# Patient Record
Sex: Female | Born: 1975 | Race: Black or African American | Hispanic: No | Marital: Single | Smoking: Never smoker
Health system: Southern US, Community
[De-identification: ages and names within clinical notes are randomized; demographics above are authoritative.]

## PROBLEM LIST (undated history)

## (undated) DIAGNOSIS — N3010 Interstitial cystitis (chronic) without hematuria: Secondary | ICD-10-CM

## (undated) DIAGNOSIS — F319 Bipolar disorder, unspecified: Secondary | ICD-10-CM

## (undated) DIAGNOSIS — M1990 Unspecified osteoarthritis, unspecified site: Secondary | ICD-10-CM

## (undated) DIAGNOSIS — Z90710 Acquired absence of both cervix and uterus: Secondary | ICD-10-CM

## (undated) DIAGNOSIS — G43909 Migraine, unspecified, not intractable, without status migrainosus: Secondary | ICD-10-CM

## (undated) DIAGNOSIS — F329 Major depressive disorder, single episode, unspecified: Secondary | ICD-10-CM

## (undated) DIAGNOSIS — F32A Depression, unspecified: Secondary | ICD-10-CM

## (undated) DIAGNOSIS — G4700 Insomnia, unspecified: Secondary | ICD-10-CM

## (undated) DIAGNOSIS — F419 Anxiety disorder, unspecified: Secondary | ICD-10-CM

## (undated) DIAGNOSIS — I10 Essential (primary) hypertension: Secondary | ICD-10-CM

## (undated) DIAGNOSIS — M549 Dorsalgia, unspecified: Secondary | ICD-10-CM

## (undated) DIAGNOSIS — M797 Fibromyalgia: Secondary | ICD-10-CM

## (undated) DIAGNOSIS — I456 Pre-excitation syndrome: Secondary | ICD-10-CM

## (undated) DIAGNOSIS — G930 Cerebral cysts: Secondary | ICD-10-CM

## (undated) DIAGNOSIS — N83209 Unspecified ovarian cyst, unspecified side: Secondary | ICD-10-CM

## (undated) DIAGNOSIS — G8929 Other chronic pain: Secondary | ICD-10-CM

## (undated) DIAGNOSIS — F4310 Post-traumatic stress disorder, unspecified: Secondary | ICD-10-CM

## (undated) DIAGNOSIS — K219 Gastro-esophageal reflux disease without esophagitis: Secondary | ICD-10-CM

## (undated) DIAGNOSIS — D219 Benign neoplasm of connective and other soft tissue, unspecified: Secondary | ICD-10-CM

## (undated) HISTORY — PX: ABDOMINAL HYSTERECTOMY: SHX81

## (undated) HISTORY — PX: CARDIAC CATHETERIZATION: SHX172

---

## 1998-05-10 ENCOUNTER — Ambulatory Visit (HOSPITAL_COMMUNITY): Admission: RE | Admit: 1998-05-10 | Discharge: 1998-05-10 | Payer: Self-pay | Admitting: Cardiology

## 1998-06-25 ENCOUNTER — Emergency Department (HOSPITAL_COMMUNITY): Admission: EM | Admit: 1998-06-25 | Discharge: 1998-06-25 | Payer: Self-pay | Admitting: Emergency Medicine

## 1998-07-31 ENCOUNTER — Encounter: Payer: Self-pay | Admitting: Cardiology

## 1998-07-31 ENCOUNTER — Ambulatory Visit (HOSPITAL_COMMUNITY): Admission: RE | Admit: 1998-07-31 | Discharge: 1998-07-31 | Payer: Self-pay | Admitting: Cardiology

## 1998-11-21 ENCOUNTER — Ambulatory Visit (HOSPITAL_COMMUNITY): Admission: RE | Admit: 1998-11-21 | Discharge: 1998-11-21 | Payer: Self-pay | Admitting: Gynecology

## 1999-03-10 ENCOUNTER — Emergency Department (HOSPITAL_COMMUNITY): Admission: EM | Admit: 1999-03-10 | Discharge: 1999-03-10 | Payer: Self-pay | Admitting: Internal Medicine

## 1999-06-03 ENCOUNTER — Other Ambulatory Visit: Admission: RE | Admit: 1999-06-03 | Discharge: 1999-06-03 | Payer: Self-pay | Admitting: Obstetrics

## 1999-09-06 ENCOUNTER — Inpatient Hospital Stay (HOSPITAL_COMMUNITY): Admission: AD | Admit: 1999-09-06 | Discharge: 1999-09-06 | Payer: Self-pay | Admitting: Obstetrics

## 1999-12-24 ENCOUNTER — Inpatient Hospital Stay (HOSPITAL_COMMUNITY): Admission: AD | Admit: 1999-12-24 | Discharge: 1999-12-24 | Payer: Self-pay | Admitting: Obstetrics

## 1999-12-29 ENCOUNTER — Inpatient Hospital Stay (HOSPITAL_COMMUNITY): Admission: AD | Admit: 1999-12-29 | Discharge: 1999-12-29 | Payer: Self-pay | Admitting: Obstetrics

## 2000-01-03 ENCOUNTER — Inpatient Hospital Stay (HOSPITAL_COMMUNITY): Admission: AD | Admit: 2000-01-03 | Discharge: 2000-01-05 | Payer: Self-pay | Admitting: Obstetrics

## 2000-09-28 ENCOUNTER — Inpatient Hospital Stay (HOSPITAL_COMMUNITY): Admission: AD | Admit: 2000-09-28 | Discharge: 2000-09-28 | Payer: Self-pay | Admitting: *Deleted

## 2000-09-28 ENCOUNTER — Encounter: Payer: Self-pay | Admitting: *Deleted

## 2000-11-18 ENCOUNTER — Ambulatory Visit (HOSPITAL_COMMUNITY): Admission: RE | Admit: 2000-11-18 | Discharge: 2000-11-18 | Payer: Self-pay | Admitting: *Deleted

## 2000-11-18 ENCOUNTER — Encounter: Admission: RE | Admit: 2000-11-18 | Discharge: 2000-11-18 | Payer: Self-pay | Admitting: Obstetrics

## 2000-12-02 ENCOUNTER — Encounter: Admission: RE | Admit: 2000-12-02 | Discharge: 2000-12-02 | Payer: Self-pay | Admitting: Obstetrics & Gynecology

## 2001-01-06 ENCOUNTER — Encounter: Admission: RE | Admit: 2001-01-06 | Discharge: 2001-01-06 | Payer: Self-pay | Admitting: Obstetrics

## 2001-01-08 ENCOUNTER — Inpatient Hospital Stay (HOSPITAL_COMMUNITY): Admission: AD | Admit: 2001-01-08 | Discharge: 2001-01-08 | Payer: Self-pay | Admitting: Obstetrics

## 2001-01-10 ENCOUNTER — Inpatient Hospital Stay (HOSPITAL_COMMUNITY): Admission: AD | Admit: 2001-01-10 | Discharge: 2001-01-10 | Payer: Self-pay | Admitting: Obstetrics

## 2001-01-20 ENCOUNTER — Encounter: Admission: RE | Admit: 2001-01-20 | Discharge: 2001-01-20 | Payer: Self-pay | Admitting: Obstetrics

## 2001-01-27 ENCOUNTER — Encounter: Admission: RE | Admit: 2001-01-27 | Discharge: 2001-01-27 | Payer: Self-pay | Admitting: Obstetrics

## 2001-02-10 ENCOUNTER — Encounter: Admission: RE | Admit: 2001-02-10 | Discharge: 2001-02-10 | Payer: Self-pay | Admitting: Obstetrics

## 2001-02-24 ENCOUNTER — Encounter: Admission: RE | Admit: 2001-02-24 | Discharge: 2001-02-24 | Payer: Self-pay | Admitting: Obstetrics

## 2001-03-03 ENCOUNTER — Encounter: Admission: RE | Admit: 2001-03-03 | Discharge: 2001-03-03 | Payer: Self-pay | Admitting: Obstetrics

## 2001-03-17 ENCOUNTER — Encounter: Admission: RE | Admit: 2001-03-17 | Discharge: 2001-03-17 | Payer: Self-pay | Admitting: Obstetrics

## 2001-03-27 ENCOUNTER — Inpatient Hospital Stay (HOSPITAL_COMMUNITY): Admission: AD | Admit: 2001-03-27 | Discharge: 2001-03-27 | Payer: Self-pay | Admitting: *Deleted

## 2001-03-27 ENCOUNTER — Inpatient Hospital Stay (HOSPITAL_COMMUNITY): Admission: AD | Admit: 2001-03-27 | Discharge: 2001-03-29 | Payer: Self-pay | Admitting: Obstetrics & Gynecology

## 2001-10-21 ENCOUNTER — Encounter: Payer: Self-pay | Admitting: Emergency Medicine

## 2001-10-21 ENCOUNTER — Emergency Department (HOSPITAL_COMMUNITY): Admission: EM | Admit: 2001-10-21 | Discharge: 2001-10-21 | Payer: Self-pay | Admitting: Emergency Medicine

## 2002-04-04 ENCOUNTER — Other Ambulatory Visit: Admission: RE | Admit: 2002-04-04 | Discharge: 2002-04-04 | Payer: Self-pay | Admitting: Gynecology

## 2002-07-21 ENCOUNTER — Emergency Department (HOSPITAL_COMMUNITY): Admission: EM | Admit: 2002-07-21 | Discharge: 2002-07-21 | Payer: Self-pay | Admitting: Emergency Medicine

## 2002-07-21 ENCOUNTER — Encounter: Payer: Self-pay | Admitting: Emergency Medicine

## 2002-10-19 ENCOUNTER — Emergency Department (HOSPITAL_COMMUNITY): Admission: EM | Admit: 2002-10-19 | Discharge: 2002-10-20 | Payer: Self-pay

## 2002-10-19 ENCOUNTER — Encounter: Payer: Self-pay | Admitting: Emergency Medicine

## 2002-11-07 ENCOUNTER — Inpatient Hospital Stay (HOSPITAL_COMMUNITY): Admission: AD | Admit: 2002-11-07 | Discharge: 2002-11-07 | Payer: Self-pay | Admitting: *Deleted

## 2003-10-24 ENCOUNTER — Inpatient Hospital Stay (HOSPITAL_COMMUNITY): Admission: AD | Admit: 2003-10-24 | Discharge: 2003-10-24 | Payer: Self-pay | Admitting: Obstetrics and Gynecology

## 2003-12-09 ENCOUNTER — Emergency Department (HOSPITAL_COMMUNITY): Admission: EM | Admit: 2003-12-09 | Discharge: 2003-12-09 | Payer: Self-pay | Admitting: Emergency Medicine

## 2004-02-12 ENCOUNTER — Encounter: Admission: RE | Admit: 2004-02-12 | Discharge: 2004-02-12 | Payer: Self-pay | Admitting: Obstetrics and Gynecology

## 2004-02-26 ENCOUNTER — Ambulatory Visit (HOSPITAL_COMMUNITY): Admission: RE | Admit: 2004-02-26 | Discharge: 2004-02-26 | Payer: Self-pay | Admitting: Obstetrics and Gynecology

## 2004-11-07 ENCOUNTER — Emergency Department (HOSPITAL_COMMUNITY): Admission: EM | Admit: 2004-11-07 | Discharge: 2004-11-07 | Payer: Self-pay | Admitting: Emergency Medicine

## 2005-05-03 ENCOUNTER — Inpatient Hospital Stay (HOSPITAL_COMMUNITY): Admission: AD | Admit: 2005-05-03 | Discharge: 2005-05-03 | Payer: Self-pay | Admitting: *Deleted

## 2005-05-29 ENCOUNTER — Emergency Department (HOSPITAL_COMMUNITY): Admission: EM | Admit: 2005-05-29 | Discharge: 2005-05-29 | Payer: Self-pay | Admitting: Family Medicine

## 2005-08-04 ENCOUNTER — Inpatient Hospital Stay (HOSPITAL_COMMUNITY): Admission: AD | Admit: 2005-08-04 | Discharge: 2005-08-04 | Payer: Self-pay | Admitting: *Deleted

## 2005-10-17 ENCOUNTER — Inpatient Hospital Stay (HOSPITAL_COMMUNITY): Admission: AD | Admit: 2005-10-17 | Discharge: 2005-10-17 | Payer: Self-pay | Admitting: Gynecology

## 2005-10-22 ENCOUNTER — Emergency Department (HOSPITAL_COMMUNITY): Admission: EM | Admit: 2005-10-22 | Discharge: 2005-10-22 | Payer: Self-pay | Admitting: Emergency Medicine

## 2006-03-24 ENCOUNTER — Encounter: Payer: Self-pay | Admitting: Cardiology

## 2006-06-16 ENCOUNTER — Emergency Department (HOSPITAL_COMMUNITY): Admission: EM | Admit: 2006-06-16 | Discharge: 2006-06-16 | Payer: Self-pay | Admitting: Emergency Medicine

## 2006-11-07 ENCOUNTER — Inpatient Hospital Stay (HOSPITAL_COMMUNITY): Admission: AD | Admit: 2006-11-07 | Discharge: 2006-11-07 | Payer: Self-pay | Admitting: Gynecology

## 2006-11-10 ENCOUNTER — Emergency Department (HOSPITAL_COMMUNITY): Admission: EM | Admit: 2006-11-10 | Discharge: 2006-11-10 | Payer: Self-pay | Admitting: Emergency Medicine

## 2007-02-23 ENCOUNTER — Emergency Department (HOSPITAL_COMMUNITY): Admission: EM | Admit: 2007-02-23 | Discharge: 2007-02-23 | Payer: Self-pay | Admitting: Emergency Medicine

## 2007-06-04 ENCOUNTER — Inpatient Hospital Stay (HOSPITAL_COMMUNITY): Admission: AD | Admit: 2007-06-04 | Discharge: 2007-06-04 | Payer: Self-pay | Admitting: Obstetrics and Gynecology

## 2007-06-30 ENCOUNTER — Emergency Department (HOSPITAL_COMMUNITY): Admission: EM | Admit: 2007-06-30 | Discharge: 2007-06-30 | Payer: Self-pay | Admitting: *Deleted

## 2007-07-26 ENCOUNTER — Emergency Department (HOSPITAL_COMMUNITY): Admission: EM | Admit: 2007-07-26 | Discharge: 2007-07-26 | Payer: Self-pay | Admitting: Emergency Medicine

## 2007-08-26 ENCOUNTER — Inpatient Hospital Stay (HOSPITAL_COMMUNITY): Admission: AD | Admit: 2007-08-26 | Discharge: 2007-08-26 | Payer: Self-pay | Admitting: Gynecology

## 2007-10-28 ENCOUNTER — Emergency Department (HOSPITAL_COMMUNITY): Admission: EM | Admit: 2007-10-28 | Discharge: 2007-10-28 | Payer: Self-pay | Admitting: Emergency Medicine

## 2008-11-13 ENCOUNTER — Emergency Department (HOSPITAL_COMMUNITY): Admission: EM | Admit: 2008-11-13 | Discharge: 2008-11-13 | Payer: Self-pay | Admitting: Emergency Medicine

## 2009-06-08 ENCOUNTER — Inpatient Hospital Stay (HOSPITAL_COMMUNITY): Admission: AD | Admit: 2009-06-08 | Discharge: 2009-06-09 | Payer: Self-pay | Admitting: Obstetrics and Gynecology

## 2009-10-11 ENCOUNTER — Encounter (INDEPENDENT_AMBULATORY_CARE_PROVIDER_SITE_OTHER): Payer: Self-pay | Admitting: Obstetrics and Gynecology

## 2009-10-11 ENCOUNTER — Ambulatory Visit (HOSPITAL_COMMUNITY): Admission: RE | Admit: 2009-10-11 | Discharge: 2009-10-12 | Payer: Self-pay | Admitting: Obstetrics and Gynecology

## 2010-03-15 ENCOUNTER — Emergency Department (HOSPITAL_COMMUNITY): Admission: EM | Admit: 2010-03-15 | Discharge: 2010-03-15 | Payer: Self-pay | Admitting: Emergency Medicine

## 2010-11-03 ENCOUNTER — Emergency Department (HOSPITAL_COMMUNITY)
Admission: EM | Admit: 2010-11-03 | Discharge: 2010-11-03 | Payer: Self-pay | Source: Home / Self Care | Admitting: Emergency Medicine

## 2010-11-18 ENCOUNTER — Encounter (HOSPITAL_COMMUNITY)
Admission: RE | Admit: 2010-11-18 | Discharge: 2010-12-23 | Payer: Self-pay | Source: Home / Self Care | Attending: Cardiology | Admitting: Cardiology

## 2010-11-27 ENCOUNTER — Emergency Department (HOSPITAL_COMMUNITY)
Admission: EM | Admit: 2010-11-27 | Discharge: 2010-11-27 | Payer: Self-pay | Source: Home / Self Care | Admitting: Family Medicine

## 2010-11-27 LAB — POCT RAPID STREP A (OFFICE): Streptococcus, Group A Screen (Direct): NEGATIVE

## 2010-12-17 ENCOUNTER — Encounter (INDEPENDENT_AMBULATORY_CARE_PROVIDER_SITE_OTHER): Payer: Self-pay | Admitting: *Deleted

## 2010-12-17 LAB — CONVERTED CEMR LAB
ALT: 14 units/L (ref 0–35)
AST: 21 units/L (ref 0–37)
Albumin: 4.4 g/dL (ref 3.5–5.2)
Alkaline Phosphatase: 64 units/L (ref 39–117)
BUN: 12 mg/dL (ref 6–23)
Basophils Absolute: 0 10*3/uL (ref 0.0–0.1)
Basophils Relative: 0 % (ref 0–1)
CO2: 24 meq/L (ref 19–32)
Calcium: 9 mg/dL (ref 8.4–10.5)
Chloride: 104 meq/L (ref 96–112)
Creatinine, Ser: 0.86 mg/dL (ref 0.40–1.20)
Eosinophils Absolute: 0.1 10*3/uL (ref 0.0–0.7)
Eosinophils Relative: 3 % (ref 0–5)
Glucose, Bld: 88 mg/dL (ref 70–99)
HCT: 37.5 % (ref 36.0–46.0)
Hemoglobin: 12.2 g/dL (ref 12.0–15.0)
Lymphocytes Relative: 43 % (ref 12–46)
Lymphs Abs: 2.3 10*3/uL (ref 0.7–4.0)
MCHC: 32.5 g/dL (ref 30.0–36.0)
MCV: 86.2 fL (ref 78.0–100.0)
Monocytes Absolute: 0.3 10*3/uL (ref 0.1–1.0)
Monocytes Relative: 5 % (ref 3–12)
Neutro Abs: 2.7 10*3/uL (ref 1.7–7.7)
Neutrophils Relative %: 49 % (ref 43–77)
Platelets: 299 10*3/uL (ref 150–400)
Potassium: 4.1 meq/L (ref 3.5–5.3)
RBC: 4.35 M/uL (ref 3.87–5.11)
RDW: 12.7 % (ref 11.5–15.5)
Sodium: 138 meq/L (ref 135–145)
TSH: 1.381 microintl units/mL (ref 0.350–4.500)
Total Bilirubin: 0.3 mg/dL (ref 0.3–1.2)
Total Protein: 7 g/dL (ref 6.0–8.3)
WBC: 5.4 10*3/uL (ref 4.0–10.5)

## 2011-01-21 ENCOUNTER — Other Ambulatory Visit (HOSPITAL_COMMUNITY): Payer: Self-pay | Admitting: Diagnostic Neuroimaging

## 2011-01-21 DIAGNOSIS — R51 Headache: Secondary | ICD-10-CM

## 2011-01-31 ENCOUNTER — Ambulatory Visit (HOSPITAL_COMMUNITY)
Admission: RE | Admit: 2011-01-31 | Discharge: 2011-01-31 | Disposition: A | Discharge status: Home / Self Care | Payer: Self-pay | Source: Ambulatory Visit | Attending: Diagnostic Neuroimaging | Admitting: Diagnostic Neuroimaging

## 2011-01-31 ENCOUNTER — Other Ambulatory Visit (HOSPITAL_COMMUNITY): Payer: Self-pay | Admitting: Diagnostic Neuroimaging

## 2011-01-31 DIAGNOSIS — R51 Headache: Secondary | ICD-10-CM

## 2011-01-31 MED ORDER — GADOBENATE DIMEGLUMINE 529 MG/ML IV SOLN
20.0000 mL | Freq: Once | INTRAVENOUS | Status: AC | PRN
  Administered 2011-01-31: 20 mL via INTRAVENOUS

## 2011-02-02 LAB — DIFFERENTIAL
Basophils Absolute: 0 10*3/uL (ref 0.0–0.1)
Basophils Relative: 0 % (ref 0–1)
Eosinophils Absolute: 0.1 10*3/uL (ref 0.0–0.7)
Eosinophils Relative: 1 % (ref 0–5)
Lymphocytes Relative: 35 % (ref 12–46)
Lymphs Abs: 1.7 10*3/uL (ref 0.7–4.0)
Monocytes Absolute: 0.4 10*3/uL (ref 0.1–1.0)
Monocytes Relative: 7 % (ref 3–12)
Neutro Abs: 2.7 10*3/uL (ref 1.7–7.7)
Neutrophils Relative %: 56 % (ref 43–77)

## 2011-02-02 LAB — POCT I-STAT, CHEM 8
BUN: 15 mg/dL (ref 6–23)
Calcium, Ion: 1.16 mmol/L (ref 1.12–1.32)
Chloride: 108 mEq/L (ref 96–112)
Creatinine, Ser: 0.8 mg/dL (ref 0.4–1.2)
Glucose, Bld: 102 mg/dL — ABNORMAL HIGH (ref 70–99)
HCT: 40 % (ref 36.0–46.0)
Hemoglobin: 13.6 g/dL (ref 12.0–15.0)
Potassium: 3.7 mEq/L (ref 3.5–5.1)
Sodium: 140 mEq/L (ref 135–145)
TCO2: 23 mmol/L (ref 0–100)

## 2011-02-02 LAB — CBC
HCT: 37.5 % (ref 36.0–46.0)
Hemoglobin: 12.4 g/dL (ref 12.0–15.0)
MCH: 28.6 pg (ref 26.0–34.0)
MCHC: 33.1 g/dL (ref 30.0–36.0)
MCV: 86.6 fL (ref 78.0–100.0)
Platelets: 255 10*3/uL (ref 150–400)
RBC: 4.33 MIL/uL (ref 3.87–5.11)
RDW: 12 % (ref 11.5–15.5)
WBC: 4.9 10*3/uL (ref 4.0–10.5)

## 2011-02-02 LAB — TROPONIN I: Troponin I: 0.03 ng/mL (ref 0.00–0.06)

## 2011-02-02 LAB — POCT CARDIAC MARKERS
CKMB, poc: <1 ng/mL — ABNORMAL LOW (ref 1.0–8.0)
Myoglobin, poc: 69.2 ng/mL (ref 12–200)
Troponin i, poc: <0.05 ng/mL (ref 0.00–0.09)

## 2011-02-02 LAB — D-DIMER, QUANTITATIVE: D-Dimer, Quant: <0.22 ug/mL-FEU (ref 0.00–0.48)

## 2011-02-02 LAB — CK TOTAL AND CKMB (NOT AT ARMC): Relative Index: 0.7 (ref 0.0–2.5)

## 2011-02-02 LAB — RAPID URINE DRUG SCREEN, HOSP PERFORMED
Opiates: POSITIVE — AB
Tetrahydrocannabinol: NOT DETECTED

## 2011-02-25 LAB — CBC
HCT: 34.1 % — ABNORMAL LOW (ref 36.0–46.0)
Hemoglobin: 10.3 g/dL — ABNORMAL LOW (ref 12.0–15.0)
Platelets: 244 10*3/uL (ref 150–400)
RBC: 3.53 MIL/uL — ABNORMAL LOW (ref 3.87–5.11)
WBC: 5.6 10*3/uL (ref 4.0–10.5)

## 2011-02-25 LAB — BASIC METABOLIC PANEL
BUN: 10 mg/dL (ref 6–23)
Creatinine, Ser: 0.87 mg/dL (ref 0.4–1.2)
GFR calc non Af Amer: >60 mL/min (ref >60)
Potassium: 3.6 mEq/L (ref 3.5–5.1)

## 2011-02-25 LAB — PREGNANCY, URINE: Preg Test, Ur: NEGATIVE

## 2011-03-01 LAB — URINE MICROSCOPIC-ADD ON

## 2011-03-01 LAB — GC/CHLAMYDIA PROBE AMP, GENITAL: Chlamydia, DNA Probe: NEGATIVE

## 2011-03-01 LAB — URINALYSIS, ROUTINE W REFLEX MICROSCOPIC
Glucose, UA: NEGATIVE mg/dL
Specific Gravity, Urine: >1.03 — ABNORMAL HIGH (ref 1.005–1.030)

## 2011-03-01 LAB — HERPES SIMPLEX VIRUS CULTURE: Culture: DETECTED

## 2011-03-01 LAB — WET PREP, GENITAL

## 2011-04-10 NOTE — Group Therapy Note (Signed)
NAME:  Anna Serrano, Anna Serrano NO.:  [id number]   MEDICAL RECORD NO.:  [id number]                   PATIENT TYPE:  OUT   LOCATION:  [facility]                           FACILITY:  [facility]   PHYSICIAN:  Argentina Donovan, MD                     DATE OF BIRTH:  15-Jun-1976   DATE OF SERVICE:  02/12/2004                                    CLINIC NOTE   REASON FOR VISIT:  This is a 36 year old gravida 3 para 2-0-1-2 with the  youngest child 21 years old, had two vaginal deliveries.  Was sent by [facility] because of fibroids and cystocele.  The patient's story is that she  has trouble tolerating oral contraceptives with headaches and all and would  like to use a Mirena IUD that has been offered at the health department but  to do that they want to make sure that the uterus is okay.  Also, she has  had 2 months' history of urinary frequency, urgency, and nocturia x4-5.   EXAMINATION:  External genitalia is normal.  The vagina is clean and well  rugated.  The cervix is hypertrophied and the first degree prolapse.  The  bimanual pelvic reveals the uterus is slightly enlarged with a cul-de-sac  fibroid.  No significant cystocele noted.   IMPRESSION:  She has a small fibroid uterus and if the cavity is okay she  probably can use a Mirena.  The urinary symptoms are probably from an  unstable bladder although we will check for a urinary tract infection.  We  are going to try her on Detrol LA 4 mg once a day in the morning and see how  she tolerates that.  If the urinary symptoms persist I would have her see a  urogynecologist.                                               Argentina Donovan, MD    PR/MEDQ  D:  02/12/2004  T:  02/12/2004  Job:  [id number]

## 2011-08-20 ENCOUNTER — Encounter (HOSPITAL_COMMUNITY): Payer: Self-pay | Admitting: Radiology

## 2011-08-20 ENCOUNTER — Emergency Department (HOSPITAL_COMMUNITY)
Admission: EM | Admit: 2011-08-20 | Discharge: 2011-08-20 | Disposition: A | Discharge status: Home / Self Care | Payer: Medicaid Other | Attending: Emergency Medicine | Admitting: Emergency Medicine

## 2011-08-20 ENCOUNTER — Emergency Department (HOSPITAL_COMMUNITY): Payer: Medicaid Other

## 2011-08-20 DIAGNOSIS — G930 Cerebral cysts: Secondary | ICD-10-CM | POA: Insufficient documentation

## 2011-08-20 DIAGNOSIS — H53149 Visual discomfort, unspecified: Secondary | ICD-10-CM | POA: Insufficient documentation

## 2011-08-20 DIAGNOSIS — I456 Pre-excitation syndrome: Secondary | ICD-10-CM | POA: Insufficient documentation

## 2011-08-20 DIAGNOSIS — G8929 Other chronic pain: Secondary | ICD-10-CM | POA: Insufficient documentation

## 2011-08-20 DIAGNOSIS — G43909 Migraine, unspecified, not intractable, without status migrainosus: Secondary | ICD-10-CM | POA: Insufficient documentation

## 2011-08-20 DIAGNOSIS — M549 Dorsalgia, unspecified: Secondary | ICD-10-CM | POA: Insufficient documentation

## 2011-08-20 DIAGNOSIS — R110 Nausea: Secondary | ICD-10-CM | POA: Insufficient documentation

## 2011-08-20 DIAGNOSIS — Z79899 Other long term (current) drug therapy: Secondary | ICD-10-CM | POA: Insufficient documentation

## 2011-08-20 HISTORY — DX: Dorsalgia, unspecified: M54.9

## 2011-08-20 HISTORY — DX: Unspecified ovarian cyst, unspecified side: N83.209

## 2011-08-20 HISTORY — DX: Benign neoplasm of connective and other soft tissue, unspecified: D21.9

## 2011-08-20 HISTORY — DX: Pre-excitation syndrome: I45.6

## 2011-08-20 HISTORY — DX: Interstitial cystitis (chronic) without hematuria: N30.10

## 2011-08-20 HISTORY — DX: Other chronic pain: G89.29

## 2011-08-20 HISTORY — DX: Acquired absence of both cervix and uterus: Z90.710

## 2011-08-20 LAB — CBC
HCT: 37.3 % (ref 36.0–46.0)
MCH: 29.9 pg (ref 26.0–34.0)
MCHC: 34.6 g/dL (ref 30.0–36.0)
RDW: 11.9 % (ref 11.5–15.5)

## 2011-08-20 LAB — POCT I-STAT, CHEM 8
Calcium, Ion: 1.21 mmol/L (ref 1.12–1.32)
Creatinine, Ser: 1 mg/dL (ref 0.50–1.10)
Hemoglobin: 13.9 g/dL (ref 12.0–15.0)
Sodium: 137 mEq/L (ref 135–145)
TCO2: 25 mmol/L (ref 0–100)

## 2011-08-20 LAB — POCT I-STAT TROPONIN I

## 2011-08-27 LAB — URINALYSIS, ROUTINE W REFLEX MICROSCOPIC
Bilirubin Urine: NEGATIVE
Glucose, UA: NEGATIVE mg/dL
Hgb urine dipstick: NEGATIVE
Ketones, ur: NEGATIVE mg/dL
Protein, ur: NEGATIVE mg/dL

## 2011-08-27 LAB — RPR: RPR Ser Ql: NONREACTIVE

## 2011-08-27 LAB — GC/CHLAMYDIA PROBE AMP, GENITAL
Chlamydia, DNA Probe: NEGATIVE
GC Probe Amp, Genital: NEGATIVE

## 2011-08-27 LAB — WET PREP, GENITAL: WBC, Wet Prep HPF POC: NONE SEEN

## 2011-08-31 LAB — URINALYSIS, ROUTINE W REFLEX MICROSCOPIC
Bilirubin Urine: NEGATIVE
Hgb urine dipstick: NEGATIVE
Ketones, ur: NEGATIVE
Nitrite: NEGATIVE
Urobilinogen, UA: 0.2

## 2011-08-31 LAB — WET PREP, GENITAL

## 2011-08-31 LAB — PREGNANCY, URINE: Preg Test, Ur: NEGATIVE

## 2011-09-03 LAB — CBC
HCT: 33.4 — ABNORMAL LOW
Hemoglobin: 11.4 — ABNORMAL LOW
Platelets: 262
WBC: 5

## 2011-09-03 LAB — URINALYSIS, ROUTINE W REFLEX MICROSCOPIC
Bilirubin Urine: NEGATIVE
Ketones, ur: NEGATIVE
Nitrite: NEGATIVE
pH: 7

## 2011-09-03 LAB — WET PREP, GENITAL
Clue Cells Wet Prep HPF POC: NONE SEEN
Trich, Wet Prep: NONE SEEN

## 2011-09-03 LAB — GC/CHLAMYDIA PROBE AMP, GENITAL
Chlamydia, DNA Probe: NEGATIVE
GC Probe Amp, Genital: NEGATIVE

## 2011-09-04 LAB — WET PREP, GENITAL: Yeast Wet Prep HPF POC: NONE SEEN

## 2011-09-04 LAB — POCT PREGNANCY, URINE: Operator id: 234501

## 2011-09-04 LAB — URINALYSIS, ROUTINE W REFLEX MICROSCOPIC
Bilirubin Urine: NEGATIVE
Glucose, UA: NEGATIVE
Hgb urine dipstick: NEGATIVE
Ketones, ur: NEGATIVE
pH: 6.5

## 2011-09-04 LAB — GC/CHLAMYDIA PROBE AMP, GENITAL: Chlamydia, DNA Probe: NEGATIVE

## 2011-09-04 LAB — RPR: RPR Ser Ql: NONREACTIVE

## 2011-10-26 ENCOUNTER — Encounter (HOSPITAL_COMMUNITY): Payer: Self-pay

## 2011-10-26 ENCOUNTER — Emergency Department (INDEPENDENT_AMBULATORY_CARE_PROVIDER_SITE_OTHER)
Admission: EM | Admit: 2011-10-26 | Discharge: 2011-10-26 | Disposition: A | Discharge status: Home / Self Care | Payer: Medicaid Other | Source: Home / Self Care | Attending: Family Medicine | Admitting: Family Medicine

## 2011-10-26 DIAGNOSIS — L259 Unspecified contact dermatitis, unspecified cause: Secondary | ICD-10-CM

## 2011-10-26 DIAGNOSIS — L309 Dermatitis, unspecified: Secondary | ICD-10-CM

## 2011-10-26 MED ORDER — TRIAMCINOLONE ACETONIDE 0.1 % EX OINT: TOPICAL_OINTMENT | Freq: Two times a day (BID) | CUTANEOUS | Status: DC

## 2011-10-26 NOTE — ED Notes (Signed)
3 week hx of rash on bilateral buttocks. Started with one bump between buttocks, now has spread to bilateral buttocks.  Areas itch slightly.  Denies any other areas with problems.

## 2011-10-26 NOTE — ED Provider Notes (Signed)
History     CSN: [id number] Arrival date & time: 10/26/2011  8:28 AM   First MD Initiated Contact with Patient 10/26/11 [phone]      Chief Complaint  Patient presents with  . Rash    3 week hx of rash on bilateral buttocks. Started with one bump between buttocks, now has spread to bilateral buttocks.  Areas itch slightly.  Denies any other areas with problems.     (Consider location/radiation/quality/duration/timing/severity/associated sxs/prior treatment) HPI Comments: Anna Serrano presents for evaluation of a pruritic scaly rash over her buttocks. She noticed the onset 3 weeks ago. She adamantly denies any new exposures. She started multiple new drugs several months ago. She did recently change toilet paper.  Patient is a 34 y.o. female presenting with rash. The history is provided by the patient.  Rash  This is a new problem. The current episode started more than 1 week ago. The problem has not changed since onset.The problem is associated with an unknown factor. There has been no fever. The rash is present on the left buttock and right buttock. The patient is experiencing no pain. The pain has been constant since onset. Associated symptoms include itching. Pertinent negatives include no pain and no weeping. She has tried nothing for the symptoms. The treatment provided no relief.    Past Medical History  Diagnosis Date  . Chronic back pain   . Fibroids   . Genital herpes   . Interstitial cystitis   . Ovarian cyst   . Wolff-Parkinson-White (WPW) syndrome   . Hx of hysterectomy     No past surgical history on file.  No family history on file.  History  Substance Use Topics  . Smoking status: Not on file  . Smokeless tobacco: Not on file  . Alcohol Use: Not on file    OB History    Grav Para Term Preterm Abortions TAB SAB Ect Mult Living                  Review of Systems  Constitutional: Negative.   HENT: Negative.   Eyes: Negative.   Respiratory: Negative.     Cardiovascular: Negative.   Gastrointestinal: Negative.   Genitourinary: Negative.   Musculoskeletal: Negative.   Skin: Positive for itching and rash.  Neurological: Negative.     Allergies  Latex  Home Medications  No current outpatient prescriptions on file.  There were no vitals taken for this visit.  Physical Exam  Constitutional: She is oriented to person, place, and time. She appears well-developed and well-nourished.  HENT:  Head: Normocephalic and atraumatic.  Eyes: EOM are normal.  Neck: Normal range of motion.  Pulmonary/Chest: Effort normal.  Neurological: She is alert and oriented to person, place, and time.  Skin: Skin is warm and dry. Rash noted. Rash is macular.       ED Course  Procedures (including critical care time)  Labs Reviewed - No data to display No results found.   No diagnosis found.    MDM  Dermatitis, likely contact.        Richardo Priest, MD 10/26/11 [phone]

## 2011-11-17 ENCOUNTER — Encounter (HOSPITAL_COMMUNITY): Payer: Self-pay | Admitting: *Deleted

## 2011-11-17 ENCOUNTER — Emergency Department (HOSPITAL_COMMUNITY)
Admission: EM | Admit: 2011-11-17 | Discharge: 2011-11-18 | Disposition: A | Discharge status: Home / Self Care | Payer: Medicaid Other | Attending: Emergency Medicine | Admitting: Emergency Medicine

## 2011-11-17 ENCOUNTER — Emergency Department (HOSPITAL_COMMUNITY): Payer: Medicaid Other

## 2011-11-17 DIAGNOSIS — G8929 Other chronic pain: Secondary | ICD-10-CM | POA: Insufficient documentation

## 2011-11-17 DIAGNOSIS — R05 Cough: Secondary | ICD-10-CM | POA: Insufficient documentation

## 2011-11-17 DIAGNOSIS — G43909 Migraine, unspecified, not intractable, without status migrainosus: Secondary | ICD-10-CM | POA: Insufficient documentation

## 2011-11-17 DIAGNOSIS — R059 Cough, unspecified: Secondary | ICD-10-CM | POA: Insufficient documentation

## 2011-11-17 DIAGNOSIS — M549 Dorsalgia, unspecified: Secondary | ICD-10-CM | POA: Insufficient documentation

## 2011-11-17 DIAGNOSIS — I456 Pre-excitation syndrome: Secondary | ICD-10-CM | POA: Insufficient documentation

## 2011-11-17 DIAGNOSIS — Z79899 Other long term (current) drug therapy: Secondary | ICD-10-CM | POA: Insufficient documentation

## 2011-11-17 DIAGNOSIS — R6889 Other general symptoms and signs: Secondary | ICD-10-CM

## 2011-11-17 DIAGNOSIS — R079 Chest pain, unspecified: Secondary | ICD-10-CM | POA: Insufficient documentation

## 2011-11-17 MED ORDER — KETOROLAC TROMETHAMINE 30 MG/ML IJ SOLN
30.0000 mg | Freq: Once | INTRAMUSCULAR | Status: AC
  Administered 2011-11-17: 30 mg via INTRAVENOUS
  Filled 2011-11-17: qty 1

## 2011-11-17 MED ORDER — DEXAMETHASONE SODIUM PHOSPHATE 10 MG/ML IJ SOLN
10.0000 mg | Freq: Once | INTRAMUSCULAR | Status: AC
  Administered 2011-11-17: 10 mg via INTRAVENOUS
  Filled 2011-11-17: qty 1

## 2011-11-17 MED ORDER — DIPHENHYDRAMINE HCL 50 MG/ML IJ SOLN
25.0000 mg | Freq: Once | INTRAMUSCULAR | Status: AC
  Administered 2011-11-17: 25 mg via INTRAVENOUS
  Filled 2011-11-17: qty 1

## 2011-11-17 MED ORDER — METOCLOPRAMIDE HCL 5 MG/ML IJ SOLN
10.0000 mg | Freq: Once | INTRAMUSCULAR | Status: AC
  Administered 2011-11-17: 10 mg via INTRAVENOUS
  Filled 2011-11-17: qty 2

## 2011-11-17 NOTE — ED Provider Notes (Signed)
History     CSN: [id number]  Arrival date & time 11/17/11  2118   First MD Initiated Contact with Patient 11/17/11 2212      Chief Complaint  Patient presents with  . Cough    (Consider location/radiation/quality/duration/timing/severity/associated sxs/prior treatment) Patient is a 35 y.o. female presenting with migraine. The history is provided by the patient. No language interpreter was used.  Migraine This is a chronic problem. The current episode started in the past 7 days. The problem occurs constantly. The problem has been unchanged. Associated symptoms include chest pain (with coughing). Pertinent negatives include no chills, fever, nausea, neck pain or vomiting. The symptoms are aggravated by nothing. She has tried nothing for the symptoms. The treatment provided no relief.    Past Medical History  Diagnosis Date  . Chronic back pain   . Fibroids   . Genital herpes   . Interstitial cystitis   . Ovarian cyst   . Wolff-Parkinson-White (WPW) syndrome   . Hx of hysterectomy     Past Surgical History  Procedure Date  . Abdominal hysterectomy     No family history on file.  History  Substance Use Topics  . Smoking status: Never Smoker   . Smokeless tobacco: Never Used  . Alcohol Use: No    OB History    Grav Para Term Preterm Abortions TAB SAB Ect Mult Living                  Review of Systems  Constitutional: Negative for fever and chills.  HENT: Negative for neck pain.   Cardiovascular: Positive for chest pain (with coughing).  Gastrointestinal: Negative for nausea and vomiting.  All other systems reviewed and are negative.    Allergies  Latex  Home Medications   Current Outpatient Rx  Name Route Sig Dispense Refill  . ACETAMINOPHEN-CODEINE #3 300-30 MG PO TABS Oral Take 1 tablet by mouth 2 (two) times daily as needed. For pain     . AMITRIPTYLINE HCL 50 MG PO TABS Oral Take 50 mg by mouth at bedtime.      . BUSPIRONE HCL 10 MG PO TABS Oral  Take 10 mg by mouth 2 times daily at 12 noon and 4 pm.      . FLUOXETINE HCL 20 MG PO CAPS Oral Take 20 mg by mouth daily.      Marland Kitchen HYDROCHLOROTHIAZIDE 25 MG PO TABS Oral Take 25 mg by mouth daily.      Marland Kitchen LORAZEPAM 1 MG PO TABS Oral Take 1 mg by mouth 2 (two) times daily as needed. For anxiety    . PROPRANOLOL HCL 10 MG PO TABS Oral Take 10 mg by mouth 2 (two) times daily.      . TRIAMCINOLONE ACETONIDE 0.1 % EX OINT Topical Apply topically 2 (two) times daily. 45 g 0  . VALACYCLOVIR HCL 500 MG PO TABS Oral Take 500 mg by mouth daily.        BP 143/88  Pulse 108  Temp(Src) 98.7 F (37.1 C) (Oral)  Resp 16  SpO2 99%  Physical Exam  Nursing note and vitals reviewed. Constitutional: She is oriented to person, place, and time. She appears well-developed and well-nourished. No distress.  HENT:  Head: Normocephalic and atraumatic.  Eyes: EOM are normal. Pupils are equal, round, and reactive to light.  Neck: Normal range of motion. Neck supple.  Cardiovascular: Normal rate and regular rhythm.  Exam reveals no friction rub.   No murmur heard. Pulmonary/Chest:  Effort normal and breath sounds normal. No respiratory distress. She has no wheezes. She has no rales.  Abdominal: Soft. She exhibits no distension. There is no tenderness. There is no rebound.  Musculoskeletal: Normal range of motion. She exhibits no edema.  Neurological: She is alert and oriented to person, place, and time. Coordination normal.  Skin: She is not diaphoretic.    ED Course  Procedures (including critical care time)  Labs Reviewed - No data to display Dg Chest 2 View  11/17/2011  *RADIOLOGY REPORT*  Clinical Data: Cough and tachycardia  CHEST - 2 VIEW  Comparison: Chest radiograph 11/03/2010  Findings: Normal mediastinum and cardiac silhouette.  Normal pulmonary  vasculature.  No evidence of effusion, infiltrate, or pneumothorax.  No acute bony abnormality.  IMPRESSION: No acute cardiopulmonary process.  Original  Report Authenticated By: Genevive Bi, M.D.     1. Flu-like symptoms   2. Migraine       MDM  38F p/w migraine and cough. States this is similar to one of her worse migraines that requires a migraine cocktail. Patient has had cough and upper respiratory symptoms for past several days. Denies fevers, productive cough. Denies neuro deficits. Afebrile, mild tachycardia in triage. Other vitals stable. Neuro intact. Lungs clear. Will obtain CXR and give headache cocktail. CXR negative, reviewed by me, read as above. After headache cocktail, pain improving, stable for discharge. Instructed to f/u with PCP in 2-3 days.        Elwin Mocha, MD 11/18/11 [facility] Manners

## 2011-11-17 NOTE — ED Notes (Signed)
Patient has been coughing since Friday and has been taking OTC without any relief.  Patient also c/o migraine.  Patient states that her chest hurts when she coughs and also her back

## 2011-11-18 NOTE — ED Notes (Signed)
Snack and beverage given to patient

## 2011-11-19 NOTE — ED Provider Notes (Signed)
  I performed a history and physical examination of Anna Serrano and discussed her management with Dr. Gwendolyn Grant.  I agree with the history, physical, assessment, and plan of care, with the following exceptions: None   Garyn Waguespack, Elvis Coil, MD 11/19/11 0007

## 2011-11-27 ENCOUNTER — Encounter (HOSPITAL_COMMUNITY): Payer: Self-pay | Admitting: *Deleted

## 2011-11-27 ENCOUNTER — Emergency Department (HOSPITAL_COMMUNITY)
Admission: EM | Admit: 2011-11-27 | Discharge: 2011-11-28 | Disposition: A | Discharge status: Home / Self Care | Payer: Medicaid Other | Attending: Emergency Medicine | Admitting: Emergency Medicine

## 2011-11-27 ENCOUNTER — Emergency Department (HOSPITAL_COMMUNITY)
Admission: EM | Admit: 2011-11-27 | Discharge: 2011-11-27 | Discharge status: Against Medical Advice | Payer: Medicaid Other | Source: Home / Self Care

## 2011-11-27 DIAGNOSIS — F411 Generalized anxiety disorder: Secondary | ICD-10-CM | POA: Insufficient documentation

## 2011-11-27 DIAGNOSIS — I456 Pre-excitation syndrome: Secondary | ICD-10-CM | POA: Insufficient documentation

## 2011-11-27 DIAGNOSIS — R51 Headache: Secondary | ICD-10-CM

## 2011-11-27 DIAGNOSIS — F329 Major depressive disorder, single episode, unspecified: Secondary | ICD-10-CM | POA: Insufficient documentation

## 2011-11-27 DIAGNOSIS — Z79899 Other long term (current) drug therapy: Secondary | ICD-10-CM | POA: Insufficient documentation

## 2011-11-27 DIAGNOSIS — Z0389 Encounter for observation for other suspected diseases and conditions ruled out: Secondary | ICD-10-CM | POA: Insufficient documentation

## 2011-11-27 HISTORY — DX: Cerebral cysts: G93.0

## 2011-11-27 HISTORY — DX: Major depressive disorder, single episode, unspecified: F32.9

## 2011-11-27 HISTORY — DX: Post-traumatic stress disorder, unspecified: F43.10

## 2011-11-28 MED ORDER — METOCLOPRAMIDE HCL 5 MG/ML IJ SOLN
10.0000 mg | Freq: Once | INTRAMUSCULAR | Status: AC
  Administered 2011-11-28: 10 mg via INTRAVENOUS
  Filled 2011-11-28: qty 2

## 2011-11-28 MED ORDER — DIPHENHYDRAMINE HCL 50 MG/ML IJ SOLN
25.0000 mg | Freq: Once | INTRAMUSCULAR | Status: AC
  Administered 2011-11-28: 25 mg via INTRAVENOUS
  Filled 2011-11-28: qty 1

## 2011-11-28 MED ORDER — DEXAMETHASONE SODIUM PHOSPHATE 10 MG/ML IJ SOLN
10.0000 mg | Freq: Once | INTRAMUSCULAR | Status: AC
  Administered 2011-11-28: 10 mg via INTRAVENOUS
  Filled 2011-11-28: qty 1

## 2011-11-28 MED ORDER — SODIUM CHLORIDE 0.9 % IV BOLUS (SEPSIS)
1000.0000 mL | Freq: Once | INTRAVENOUS | Status: AC
  Administered 2011-11-28: 1000 mL via INTRAVENOUS

## 2011-11-28 NOTE — ED Provider Notes (Signed)
History     CSN: [id number]  Arrival date & time 11/27/11  2122   First MD Initiated Contact with Patient 11/28/11 0202      Chief Complaint  Patient presents with  . Migraine    pt reports migraine since Christmas. reports no relief with home medications. pt reports nausea denies vomiting. reports hx of same.     (Consider location/radiation/quality/duration/timing/severity/associated sxs/prior treatment) Patient is a 36 y.o. female presenting with migraine. The history is provided by the patient.  Migraine This is a recurrent problem. The current episode started 6 to 12 hours ago (Started about 2 weeks ago and has them every day). The problem occurs constantly. The problem has not changed since onset.Associated symptoms include headaches. Pertinent negatives include no chest pain, no abdominal pain and no shortness of breath. The symptoms are aggravated by nothing. The symptoms are relieved by nothing. Treatments tried: Taking Elavil prescribed by her physician. The treatment provided mild relief.   Patient has chronic headaches everyday and has seen multiple physicians for the same. She is very anxious about history of an arachnoid cyst but she denies any new weakness, numbness, trouble with vision, trouble with gait or any new complaints. She states she has insomnia and when she doesn't get sleep her headaches are worse. No neck pain or stiffness. Pain is located posterior and top of her head, throbbing in nature without radiation. She has long-standing history of same since she was a teenager. She has had CT scans and MRIs in the past. She is specifically requesting a referral to Dr. Anne Hahn with [facility] Past Medical History  Diagnosis Date  . Chronic back pain   . Fibroids   . Genital herpes   . Interstitial cystitis   . Ovarian cyst   . Wolff-Parkinson-White (WPW) syndrome   . Hx of hysterectomy   . Major depressive disorder   . PTSD (post-traumatic stress  disorder)   . Arachnoid cyst   . Migraine     Past Surgical History  Procedure Date  . Abdominal hysterectomy     History reviewed. No pertinent family history.  History  Substance Use Topics  . Smoking status: Never Smoker   . Smokeless tobacco: Never Used  . Alcohol Use: No    OB History    Grav Para Term Preterm Abortions TAB SAB Ect Mult Living                  Review of Systems  Constitutional: Negative for fever and chills.  HENT: Negative for neck pain and neck stiffness.   Eyes: Negative for pain.  Respiratory: Negative for shortness of breath.   Cardiovascular: Negative for chest pain.  Gastrointestinal: Negative for abdominal pain.  Genitourinary: Negative for dysuria.  Musculoskeletal: Negative for back pain.  Skin: Negative for rash.  Neurological: Positive for headaches. Negative for dizziness, tremors, seizures, syncope, speech difficulty, weakness, light-headedness and numbness.  All other systems reviewed and are negative.    Allergies  Latex  Home Medications   Current Outpatient Rx  Name Route Sig Dispense Refill  . ACETAMINOPHEN-CODEINE #3 300-30 MG PO TABS Oral Take 1 tablet by mouth 2 (two) times daily as needed. For pain     . AMITRIPTYLINE HCL 50 MG PO TABS Oral Take 50 mg by mouth at bedtime.      . BUSPIRONE HCL 10 MG PO TABS Oral Take 10 mg by mouth 2 times daily at 12 noon and 4 pm.      .  FLUOXETINE HCL 20 MG PO CAPS Oral Take 20 mg by mouth daily.      Marland Kitchen LORAZEPAM 1 MG PO TABS Oral Take 1 mg by mouth 2 (two) times daily as needed. For anxiety    . PROPRANOLOL HCL 10 MG PO TABS Oral Take 10 mg by mouth 2 (two) times daily.      Marland Kitchen VALACYCLOVIR HCL 500 MG PO TABS Oral Take 500 mg by mouth as needed. Cold sores      BP 135/93  Pulse 98  Temp(Src) 98 F (36.7 C) (Oral)  Resp 20  SpO2 100%  Physical Exam  Constitutional: She is oriented to person, place, and time. She appears well-developed and well-nourished.  HENT:  Head:  Normocephalic and atraumatic.  Eyes: Conjunctivae and EOM are normal. Pupils are equal, round, and reactive to light.  Neck: Full passive range of motion without pain. Neck supple. No thyromegaly present.       No meningismus  Cardiovascular: Normal rate, regular rhythm, S1 normal, S2 normal and intact distal pulses.   Pulmonary/Chest: Effort normal and breath sounds normal. No stridor.  Abdominal: Soft. Bowel sounds are normal. There is no tenderness. There is no CVA tenderness.  Musculoskeletal: Normal range of motion.  Neurological: She is alert and oriented to person, place, and time. She has normal strength and normal reflexes. No cranial nerve deficit or sensory deficit. She displays a negative Romberg sign. GCS eye subscore is 4. GCS verbal subscore is 5. GCS motor subscore is 6.       Normal Gait  Skin: Skin is warm and dry. No rash noted. No cyanosis. Nails show no clubbing.  Psychiatric: She has a normal mood and affect. Her speech is normal and behavior is normal.    ED Course  Procedures (including critical care time)  IV fluids and migraine cocktail provided. Headache improving on recheck stable for discharge home with no change on normal neuro exam   MDM   Headache with history daily headaches. Patient requesting referral to Dr. Anne Hahn which was provided.  No indication for emergent CT scan at this time. No indication for further workup in the emergency department.        Sunnie Nielsen, MD 11/28/11 [phone]

## 2011-11-28 NOTE — ED Notes (Signed)
Pt states she has a hx of migraine headaches. During my assessment pt was showed no distress to the lights in the room. Pt states she has photophobia. Pt ambulated to the bathroom without difficulty. Prior to ambulating pt stated she has double and blurry vision and has a hard time walking. Pt also stated she drove herself to the ED and admitted she should not have been driving. Pt had normal behavior while conversing with her friend or family member in the room. Pt also reported that she had trouble with her memory. Pt was able to talk about her entire last visit at [facility] without difficulty. Pt appears to be no apparent distress. Will continue to monitor

## 2012-08-01 ENCOUNTER — Encounter (HOSPITAL_COMMUNITY): Payer: Self-pay | Admitting: Emergency Medicine

## 2012-08-01 ENCOUNTER — Emergency Department (HOSPITAL_COMMUNITY)
Admission: EM | Admit: 2012-08-01 | Discharge: 2012-08-01 | Disposition: A | Discharge status: Home / Self Care | Payer: Medicaid Other | Attending: Emergency Medicine | Admitting: Emergency Medicine

## 2012-08-01 DIAGNOSIS — G43909 Migraine, unspecified, not intractable, without status migrainosus: Secondary | ICD-10-CM | POA: Insufficient documentation

## 2012-08-01 DIAGNOSIS — I456 Pre-excitation syndrome: Secondary | ICD-10-CM | POA: Insufficient documentation

## 2012-08-01 DIAGNOSIS — G8929 Other chronic pain: Secondary | ICD-10-CM | POA: Insufficient documentation

## 2012-08-01 DIAGNOSIS — M549 Dorsalgia, unspecified: Secondary | ICD-10-CM | POA: Insufficient documentation

## 2012-08-01 DIAGNOSIS — F319 Bipolar disorder, unspecified: Secondary | ICD-10-CM | POA: Insufficient documentation

## 2012-08-01 DIAGNOSIS — F4310 Post-traumatic stress disorder, unspecified: Secondary | ICD-10-CM | POA: Insufficient documentation

## 2012-08-01 HISTORY — DX: Migraine, unspecified, not intractable, without status migrainosus: G43.909

## 2012-08-01 HISTORY — DX: Bipolar disorder, unspecified: F31.9

## 2012-08-01 LAB — URINALYSIS, ROUTINE W REFLEX MICROSCOPIC
Ketones, ur: NEGATIVE mg/dL
Leukocytes, UA: NEGATIVE
Protein, ur: NEGATIVE mg/dL
Urobilinogen, UA: 0.2 mg/dL (ref 0.0–1.0)

## 2012-08-01 LAB — BASIC METABOLIC PANEL
Calcium: 8.9 mg/dL (ref 8.4–10.5)
Creatinine, Ser: 0.83 mg/dL (ref 0.50–1.10)
GFR calc non Af Amer: >90 mL/min (ref >90)
Sodium: 137 mEq/L (ref 135–145)

## 2012-08-01 MED ORDER — MORPHINE SULFATE 2 MG/ML IJ SOLN: 2.0000 mg | Freq: Once | INTRAMUSCULAR | Status: DC

## 2012-08-01 MED ORDER — PROMETHAZINE HCL 25 MG/ML IJ SOLN
25.0000 mg | Freq: Once | INTRAMUSCULAR | Status: AC
  Administered 2012-08-01: 25 mg via INTRAVENOUS
  Filled 2012-08-01: qty 1

## 2012-08-01 MED ORDER — KETOROLAC TROMETHAMINE 30 MG/ML IJ SOLN
30.0000 mg | Freq: Once | INTRAMUSCULAR | Status: AC
  Administered 2012-08-01: 30 mg via INTRAVENOUS
  Filled 2012-08-01: qty 1

## 2012-08-01 NOTE — ED Provider Notes (Signed)
History     CSN: [id number]  Arrival date & time 08/01/12  0721   First MD Initiated Contact with Patient 08/01/12 [phone]      Chief Complaint  Patient presents with  . Headache    (Consider location/radiation/quality/duration/timing/severity/associated sxs/prior treatment) Patient is a 36 y.o. female presenting with headaches. The history is provided by the patient.  Headache  Associated symptoms include nausea. Pertinent negatives include no fever, no shortness of breath and no vomiting.   36 year old, female, with chronic migraines, presents emergency department with a migraine for the past 2 weeks.  Along with back pain, which she says extends from her sacral area of the way up.  Her spine to her skull.  Her symptoms are associated with lightheadedness, and nausea and photophobia.  She has not had vomiting, fevers, or rash.  Besides her back in her head.  Nothing else hurts.  She denies trauma.  Past Medical History  Diagnosis Date  . Chronic back pain   . Fibroids   . Genital herpes   . Interstitial cystitis   . Ovarian cyst   . Wolff-Parkinson-White (WPW) syndrome   . Hx of hysterectomy   . Major depressive disorder   . PTSD (post-traumatic stress disorder)   . Arachnoid cyst   . Migraine   . Migraine   . Bipolar 1 disorder     Past Surgical History  Procedure Date  . Abdominal hysterectomy     No family history on file.  History  Substance Use Topics  . Smoking status: Never Smoker   . Smokeless tobacco: Never Used  . Alcohol Use: No    OB History    Grav Para Term Preterm Abortions TAB SAB Ect Mult Living                  Review of Systems  Constitutional: Negative for fever and chills.  HENT: Positive for neck pain. Negative for congestion and sore throat.   Eyes: Positive for photophobia.  Respiratory: Negative for cough and shortness of breath.   Cardiovascular: Negative for chest pain.  Gastrointestinal: Positive for nausea. Negative for  vomiting and abdominal pain.  Genitourinary: Negative for dysuria and hematuria.  Musculoskeletal: Positive for back pain.  Neurological: Positive for headaches.  Hematological: Does not bruise/bleed easily.  Psychiatric/Behavioral: Negative for confusion.  All other systems reviewed and are negative.    Allergies  Latex  Home Medications   Current Outpatient Rx  Name Route Sig Dispense Refill  . ACETAMINOPHEN-CODEINE #3 300-30 MG PO TABS Oral Take 1 tablet by mouth 2 (two) times daily as needed. For pain     . AMITRIPTYLINE HCL 50 MG PO TABS Oral Take 50 mg by mouth at bedtime.      . BUSPIRONE HCL 10 MG PO TABS Oral Take 10 mg by mouth 2 times daily at 12 noon and 4 pm.      . FLUOXETINE HCL 20 MG PO CAPS Oral Take 20 mg by mouth daily.      Marland Kitchen LORAZEPAM 1 MG PO TABS Oral Take 1 mg by mouth 2 (two) times daily as needed. For anxiety    . PROPRANOLOL HCL 10 MG PO TABS Oral Take 10 mg by mouth 2 (two) times daily.      Marland Kitchen VALACYCLOVIR HCL 500 MG PO TABS Oral Take 500 mg by mouth as needed. Cold sores      BP 117/99  Pulse 79  Temp 98.1 F (36.7 C) (Oral)  Resp  17  SpO2 93%  Physical Exam  Nursing note and vitals reviewed. Constitutional: She is oriented to person, place, and time. No distress.       Morbidly obese  HENT:  Head: Normocephalic and atraumatic.  Eyes: Conjunctivae are normal. Pupils are equal, round, and reactive to light.  Neck: Normal range of motion. Neck supple.       No nuchal rigidity No neck ttp  Cardiovascular: Normal rate, normal heart sounds and intact distal pulses.   No murmur heard. Pulmonary/Chest: Effort normal and breath sounds normal. No respiratory distress.  Abdominal: Soft. She exhibits no distension. There is no tenderness.  Musculoskeletal: Normal range of motion. She exhibits no edema.       Mild ttp over sacral area. No masses, rash, skin changes.  Neurological: She is alert and oriented to person, place, and time. She has normal  strength. No cranial nerve deficit. She exhibits normal muscle tone.  Skin: Skin is warm and dry. No rash noted.  Psychiatric: She has a normal mood and affect. Thought content normal.    ED Course  Procedures (including critical care time)chronic migraine along with back pain.  No systemic sxs. On  Multiple meds.   Will do ua and bmp to check renal fx and see if uti is etio for back pain.   No indication of ams or neuro deficit.  No imaging indicated.    Labs Reviewed  BASIC METABOLIC PANEL  URINALYSIS, ROUTINE W REFLEX MICROSCOPIC   No results found.   No diagnosis found.  9:16 AM I had to wake pt up to reevaluate her and report test results.  She appears very comfortable, but claims that pain persists. Will give one more dose of meds and release.   MDM  Chronic migraine Nontraumatic back pain        Cheri Guppy, MD 08/01/12 [phone]

## 2012-08-01 NOTE — ED Provider Notes (Signed)
1030 Report received from B side from Dr. Weldon Inches.  Patient moved to CDU after being treated with  migraine cocktail. Patient is alert but somewhat sleepy from the medication. States that she can have a ride come pick her up in about an hour. 1130  States that her headache is gone but still having some back pain 3/10.  Ambulating without difficulty No cauda equina symptoms.  No red flags.   The UA is negative for infection all other labs are normal. Patient will followup with her chiropractor tomorrow and  Neurologist at [facility] this week for her migraine h/a.  Patient is ready for discharge.    Remi Haggard, NP 08/01/12 1132

## 2012-08-01 NOTE — ED Provider Notes (Signed)
Medical screening examination/treatment/procedure(s) were conducted as a shared visit with non-physician practitioner(s) and myself.  I personally evaluated the patient during the encounter  Cheri Guppy, MD 08/01/12 1625

## 2012-08-01 NOTE — ED Notes (Signed)
Med held due to no ride home

## 2012-08-01 NOTE — ED Notes (Signed)
PT HAS FOUND A RIDE HOME. NP IN TO DISCUSS DISCHARGE RESULTS WITH HER

## 2012-08-01 NOTE — ED Notes (Signed)
Pt very groggy states that h/a is better has no way to get home she drove here dr C aware

## 2012-08-01 NOTE — ED Notes (Signed)
H/a x 2 weeks, had appointment last Friday w/ [facility] for migrains had to cancel due to family issues ,also having back pain no dysuria  , got tested at her dr Kirkland Hun weeks ago for same back pain  and given antiobiotics  But pain has come back and her neck hurts also.

## 2012-08-24 ENCOUNTER — Ambulatory Visit: Payer: Medicaid Other | Attending: Family Medicine

## 2012-08-24 DIAGNOSIS — M546 Pain in thoracic spine: Secondary | ICD-10-CM | POA: Insufficient documentation

## 2012-08-24 DIAGNOSIS — IMO0001 Reserved for inherently not codable concepts without codable children: Secondary | ICD-10-CM | POA: Insufficient documentation

## 2012-08-24 DIAGNOSIS — M545 Low back pain, unspecified: Secondary | ICD-10-CM | POA: Insufficient documentation

## 2012-08-24 DIAGNOSIS — G44209 Tension-type headache, unspecified, not intractable: Secondary | ICD-10-CM | POA: Insufficient documentation

## 2012-08-24 DIAGNOSIS — M542 Cervicalgia: Secondary | ICD-10-CM | POA: Insufficient documentation

## 2012-08-24 DIAGNOSIS — R5381 Other malaise: Secondary | ICD-10-CM | POA: Insufficient documentation

## 2012-08-31 ENCOUNTER — Ambulatory Visit: Payer: Medicaid Other

## 2012-09-07 ENCOUNTER — Ambulatory Visit: Payer: Medicaid Other | Admitting: Physical Therapy

## 2012-09-14 ENCOUNTER — Ambulatory Visit: Payer: Medicaid Other

## 2012-11-18 ENCOUNTER — Other Ambulatory Visit (HOSPITAL_COMMUNITY): Payer: Self-pay | Admitting: Cardiology

## 2012-11-18 DIAGNOSIS — R079 Chest pain, unspecified: Secondary | ICD-10-CM

## 2012-11-25 ENCOUNTER — Encounter (HOSPITAL_COMMUNITY)
Admission: RE | Admit: 2012-11-25 | Discharge: 2012-11-25 | Disposition: A | Discharge status: Home / Self Care | Payer: Medicaid Other | Source: Ambulatory Visit | Attending: Cardiology | Admitting: Cardiology

## 2012-11-25 ENCOUNTER — Other Ambulatory Visit: Payer: Self-pay

## 2012-11-25 DIAGNOSIS — R079 Chest pain, unspecified: Secondary | ICD-10-CM

## 2012-11-25 MED ORDER — TECHNETIUM TC 99M SESTAMIBI GENERIC - CARDIOLITE
10.0000 | Freq: Once | INTRAVENOUS | Status: AC | PRN
  Administered 2012-11-25: 10 via INTRAVENOUS

## 2012-11-25 MED ORDER — ACETAMINOPHEN 325 MG PO TABS
ORAL_TABLET | ORAL | Status: AC
  Filled 2012-11-25: qty 2

## 2012-11-25 MED ORDER — REGADENOSON 0.4 MG/5ML IV SOLN
INTRAVENOUS | Status: AC
  Filled 2012-11-25: qty 5

## 2012-11-25 MED ORDER — ACETAMINOPHEN 325 MG PO TABS
650.0000 mg | ORAL_TABLET | Freq: Once | ORAL | Status: AC
  Administered 2012-11-25: 650 mg via ORAL

## 2012-11-25 MED ORDER — REGADENOSON 0.4 MG/5ML IV SOLN
0.4000 mg | Freq: Once | INTRAVENOUS | Status: AC
  Administered 2012-11-25: 0.4 mg via INTRAVENOUS

## 2012-11-25 MED ORDER — TECHNETIUM TC 99M SESTAMIBI GENERIC - CARDIOLITE
30.0000 | Freq: Once | INTRAVENOUS | Status: AC | PRN
  Administered 2012-11-25: 30 via INTRAVENOUS

## 2013-01-07 ENCOUNTER — Emergency Department (HOSPITAL_COMMUNITY)
Admission: EM | Admit: 2013-01-07 | Discharge: 2013-01-07 | Disposition: A | Discharge status: Home / Self Care | Payer: Medicaid Other | Attending: Emergency Medicine | Admitting: Emergency Medicine

## 2013-01-07 ENCOUNTER — Encounter (HOSPITAL_COMMUNITY): Payer: Self-pay | Admitting: *Deleted

## 2013-01-07 DIAGNOSIS — M549 Dorsalgia, unspecified: Secondary | ICD-10-CM | POA: Insufficient documentation

## 2013-01-07 DIAGNOSIS — Z90710 Acquired absence of both cervix and uterus: Secondary | ICD-10-CM | POA: Insufficient documentation

## 2013-01-07 DIAGNOSIS — Z8659 Personal history of other mental and behavioral disorders: Secondary | ICD-10-CM | POA: Insufficient documentation

## 2013-01-07 DIAGNOSIS — G8929 Other chronic pain: Secondary | ICD-10-CM | POA: Insufficient documentation

## 2013-01-07 DIAGNOSIS — G43909 Migraine, unspecified, not intractable, without status migrainosus: Secondary | ICD-10-CM | POA: Insufficient documentation

## 2013-01-07 DIAGNOSIS — Z8742 Personal history of other diseases of the female genital tract: Secondary | ICD-10-CM | POA: Insufficient documentation

## 2013-01-07 DIAGNOSIS — Z8679 Personal history of other diseases of the circulatory system: Secondary | ICD-10-CM | POA: Insufficient documentation

## 2013-01-07 DIAGNOSIS — F319 Bipolar disorder, unspecified: Secondary | ICD-10-CM | POA: Insufficient documentation

## 2013-01-07 DIAGNOSIS — Z8669 Personal history of other diseases of the nervous system and sense organs: Secondary | ICD-10-CM | POA: Insufficient documentation

## 2013-01-07 DIAGNOSIS — R51 Headache: Secondary | ICD-10-CM | POA: Insufficient documentation

## 2013-01-07 MED ORDER — DEXAMETHASONE SODIUM PHOSPHATE 10 MG/ML IJ SOLN
10.0000 mg | Freq: Once | INTRAMUSCULAR | Status: AC
  Administered 2013-01-07: 10 mg via INTRAVENOUS
  Filled 2013-01-07: qty 1

## 2013-01-07 MED ORDER — ONDANSETRON HCL 4 MG/2ML IJ SOLN
4.0000 mg | Freq: Once | INTRAMUSCULAR | Status: AC
  Administered 2013-01-07: 4 mg via INTRAVENOUS
  Filled 2013-01-07: qty 2

## 2013-01-07 MED ORDER — HYDROCODONE-ACETAMINOPHEN 5-325 MG PO TABS: 1.0000 | ORAL_TABLET | Freq: Four times a day (QID) | ORAL | Status: DC | PRN

## 2013-01-07 MED ORDER — METOCLOPRAMIDE HCL 5 MG/ML IJ SOLN
10.0000 mg | Freq: Once | INTRAMUSCULAR | Status: AC
  Administered 2013-01-07: 10 mg via INTRAVENOUS
  Filled 2013-01-07: qty 2

## 2013-01-07 MED ORDER — KETOROLAC TROMETHAMINE 30 MG/ML IJ SOLN
30.0000 mg | Freq: Once | INTRAMUSCULAR | Status: AC
  Administered 2013-01-07: 30 mg via INTRAVENOUS
  Filled 2013-01-07: qty 1

## 2013-01-07 NOTE — ED Notes (Signed)
Pt is here with migraine for 4-5 days and states that symptoms worsen and at home meds are not working.  Pt reports vomiting today.  Sensitive to light

## 2013-01-07 NOTE — ED Provider Notes (Signed)
History     CSN: [id number]  Arrival date & time 01/07/13  1129   First MD Initiated Contact with Patient 01/07/13 1426      Chief Complaint  Patient presents with  . Migraine    (Consider location/radiation/quality/duration/timing/severity/associated sxs/prior treatment) Patient is a 37 y.o. female presenting with migraines. The history is provided by the patient.  Migraine Associated symptoms include headaches. Pertinent negatives include no chest pain, no abdominal pain and no shortness of breath.  pt c/o 'migraine' headache for the past 4-5 days. Gradual onset, mild at onset, slowly worse, throbbing frontal headaches, worse w loud noise and bright lights. Same as prior migraines. No acute or abrupt worsening today. No eye pain or change in vision. No neck pain or stiffness. No change in speech. No numbness/weakness. No problems w balance or coordination.  No recent head injury or fall. No positional change to headache. No sinus drainage, congestion or uri symptoms. No fever or chills.     Past Medical History  Diagnosis Date  . Chronic back pain   . Fibroids   . Genital herpes   . Interstitial cystitis   . Ovarian cyst   . Wolff-Parkinson-White (WPW) syndrome   . Hx of hysterectomy   . Major depressive disorder   . PTSD (post-traumatic stress disorder)   . Arachnoid cyst   . Migraine   . Migraine   . Bipolar 1 disorder     Past Surgical History  Procedure Laterality Date  . Abdominal hysterectomy      No family history on file.  History  Substance Use Topics  . Smoking status: Never Smoker   . Smokeless tobacco: Never Used  . Alcohol Use: No    OB History   Grav Para Term Preterm Abortions TAB SAB Ect Mult Living                  Review of Systems  Constitutional: Negative for fever and chills.  HENT: Negative for sore throat, neck pain and neck stiffness.   Eyes: Negative for redness and visual disturbance.  Respiratory: Negative for shortness of  breath.   Cardiovascular: Negative for chest pain.  Gastrointestinal: Negative for abdominal pain.  Endocrine: Negative for polyuria.  Genitourinary: Negative for dysuria and flank pain.  Musculoskeletal: Negative for back pain.  Skin: Negative for rash.  Neurological: Positive for headaches. Negative for syncope, weakness, light-headedness and numbness.  Hematological: Does not bruise/bleed easily.  Psychiatric/Behavioral: Negative for confusion.    Allergies  Latex  Home Medications   Current Outpatient Rx  Name  Route  Sig  Dispense  Refill  . butalbital-acetaminophen-caffeine (FIORICET, ESGIC) 50-325-40 MG per tablet   Oral   Take 1-2 tablets by mouth every 6 (six) hours as needed for headache.         . citalopram (CELEXA) 20 MG tablet   Oral   Take 20 mg by mouth daily.         . cyclobenzaprine (FLEXERIL) 10 MG tablet   Oral   Take 10 mg by mouth 3 (three) times daily as needed for muscle spasms.         Marland Kitchen docusate sodium (COLACE) 100 MG capsule   Oral   Take 100 mg by mouth 2 (two) times daily.         Marland Kitchen gabapentin (NEURONTIN) 300 MG capsule   Oral   Take 300 mg by mouth 4 (four) times daily.         Marland Kitchen  lamoTRIgine (LAMICTAL) 100 MG tablet   Oral   Take 100 mg by mouth daily.         . nitroGLYCERIN (NITROSTAT) 0.4 MG SL tablet   Sublingual   Place 0.4 mg under the tongue every 5 (five) minutes as needed for chest pain.         Marland Kitchen propranolol (INDERAL) 10 MG tablet   Oral   Take 10 mg by mouth 2 (two) times daily.           . traMADol (ULTRAM) 50 MG tablet   Oral   Take 50 mg by mouth every 6 (six) hours as needed for pain.         . valACYclovir (VALTREX) 500 MG tablet   Oral   Take 500 mg by mouth daily as needed. Cold sores         . zolpidem (AMBIEN) 10 MG tablet   Oral   Take 10 mg by mouth at bedtime as needed. For sleep           BP 115/80  Pulse 84  Temp(Src) 98.2 F (36.8 C) (Oral)  Resp 19  SpO2  98%  Physical Exam  Nursing note and vitals reviewed. Constitutional: She is oriented to person, place, and time. She appears well-developed and well-nourished. No distress.  HENT:  Head: Atraumatic.  Nose: Nose normal.  Mouth/Throat: Oropharynx is clear and moist.  No sinus or temporal tenderness.  Eyes: Conjunctivae and EOM are normal. Pupils are equal, round, and reactive to light. No scleral icterus.  Neck: Neck supple. No tracheal deviation present. No thyromegaly present.  No stiffness or rigidity.   Cardiovascular: Normal rate, regular rhythm, normal heart sounds and intact distal pulses.  Exam reveals no gallop and no friction rub.   No murmur heard. Pulmonary/Chest: Effort normal and breath sounds normal. No respiratory distress.  Abdominal: Soft. Normal appearance and bowel sounds are normal. She exhibits no distension. There is no tenderness.  Genitourinary:  No cva tenderness.  Musculoskeletal: Normal range of motion. She exhibits no edema and no tenderness.  Neurological: She is alert and oriented to person, place, and time. No cranial nerve deficit.  Motor intact bilaterally. Steady gait.   Skin: Skin is warm and dry. No rash noted. She is not diaphoretic.  Psychiatric: She has a normal mood and affect.    ED Course  Procedures (including critical care time)     MDM  Pt requests 'headache cocktail', stating has worked well in past. Pt drove self to ed.  toradol iv, reglan iv, decadron iv, zofran iv.  Reviewed nursing notes and prior charts for additional history.   Pt feels improved.   Stable for d/c.         Suzi Roots, MD 01/07/13 [phone]

## 2013-01-07 NOTE — ED Notes (Signed)
Discharge instructions reviewed, pt verbalized understanding.

## 2013-01-07 NOTE — ED Notes (Signed)
Pt has hx of migraines, has had a migraine for 4-5 days and was prescribed butabrotol caffeine pills to manage migraines but has had no relief. Pt goes to [facility] clinic for tx. Pt last took medication last night, was unable to sleep. Pt has light sensitivity, light headedness, and nausea. Rates pain 10/10. Pt describes pain as tight band around head that radiates down face to jaws and teeth.

## 2013-03-23 ENCOUNTER — Encounter (HOSPITAL_COMMUNITY): Payer: Self-pay | Admitting: *Deleted

## 2013-03-23 ENCOUNTER — Emergency Department (HOSPITAL_COMMUNITY)
Admission: EM | Admit: 2013-03-23 | Discharge: 2013-03-23 | Disposition: A | Discharge status: Home / Self Care | Payer: Medicaid Other | Attending: Emergency Medicine | Admitting: Emergency Medicine

## 2013-03-23 DIAGNOSIS — M549 Dorsalgia, unspecified: Secondary | ICD-10-CM | POA: Insufficient documentation

## 2013-03-23 DIAGNOSIS — F329 Major depressive disorder, single episode, unspecified: Secondary | ICD-10-CM | POA: Insufficient documentation

## 2013-03-23 DIAGNOSIS — F319 Bipolar disorder, unspecified: Secondary | ICD-10-CM | POA: Insufficient documentation

## 2013-03-23 DIAGNOSIS — R110 Nausea: Secondary | ICD-10-CM | POA: Insufficient documentation

## 2013-03-23 DIAGNOSIS — Z8679 Personal history of other diseases of the circulatory system: Secondary | ICD-10-CM | POA: Insufficient documentation

## 2013-03-23 DIAGNOSIS — Z91040 Latex allergy status: Secondary | ICD-10-CM | POA: Insufficient documentation

## 2013-03-23 DIAGNOSIS — Z90710 Acquired absence of both cervix and uterus: Secondary | ICD-10-CM | POA: Insufficient documentation

## 2013-03-23 DIAGNOSIS — H5710 Ocular pain, unspecified eye: Secondary | ICD-10-CM | POA: Insufficient documentation

## 2013-03-23 DIAGNOSIS — Z8742 Personal history of other diseases of the female genital tract: Secondary | ICD-10-CM | POA: Insufficient documentation

## 2013-03-23 DIAGNOSIS — G8929 Other chronic pain: Secondary | ICD-10-CM | POA: Insufficient documentation

## 2013-03-23 DIAGNOSIS — R51 Headache: Secondary | ICD-10-CM | POA: Insufficient documentation

## 2013-03-23 DIAGNOSIS — H53149 Visual discomfort, unspecified: Secondary | ICD-10-CM | POA: Insufficient documentation

## 2013-03-23 DIAGNOSIS — Z79899 Other long term (current) drug therapy: Secondary | ICD-10-CM | POA: Insufficient documentation

## 2013-03-23 DIAGNOSIS — I456 Pre-excitation syndrome: Secondary | ICD-10-CM | POA: Insufficient documentation

## 2013-03-23 DIAGNOSIS — F4310 Post-traumatic stress disorder, unspecified: Secondary | ICD-10-CM | POA: Insufficient documentation

## 2013-03-23 MED ORDER — KETOROLAC TROMETHAMINE 30 MG/ML IJ SOLN
30.0000 mg | Freq: Once | INTRAMUSCULAR | Status: AC
  Administered 2013-03-23: 30 mg via INTRAVENOUS
  Filled 2013-03-23: qty 1

## 2013-03-23 MED ORDER — METOCLOPRAMIDE HCL 5 MG/ML IJ SOLN
10.0000 mg | Freq: Once | INTRAMUSCULAR | Status: AC
  Administered 2013-03-23: 10 mg via INTRAVENOUS
  Filled 2013-03-23: qty 2

## 2013-03-23 MED ORDER — SODIUM CHLORIDE 0.9 % IV BOLUS (SEPSIS)
1000.0000 mL | Freq: Once | INTRAVENOUS | Status: AC
  Administered 2013-03-23: 1000 mL via INTRAVENOUS

## 2013-03-23 MED ORDER — DIPHENHYDRAMINE HCL 50 MG/ML IJ SOLN
25.0000 mg | Freq: Once | INTRAMUSCULAR | Status: AC
  Administered 2013-03-23: 25 mg via INTRAVENOUS
  Filled 2013-03-23: qty 1

## 2013-03-23 MED ORDER — TETRACAINE HCL 0.5 % OP SOLN
1.0000 [drp] | Freq: Once | OPHTHALMIC | Status: AC
  Administered 2013-03-23: 1 [drp] via OPHTHALMIC
  Filled 2013-03-23: qty 2

## 2013-03-23 NOTE — ED Notes (Signed)
Pt states that she has ahd a headache for 3 days. States that this is similar to her migraines that she has had in teh past. Pt states that she has sensitivity to light ans sounds. No neuro deficits noted.

## 2013-03-23 NOTE — ED Provider Notes (Signed)
History     CSN: [id number]  Arrival date & time 03/23/13  4098   First MD Initiated Contact with Patient 03/23/13 [phone]      Chief Complaint  Patient presents with  . Headache    (Consider location/radiation/quality/duration/timing/severity/associated sxs/prior treatment) HPI Comments: 37 year old female history of frequent migraine headaches who has tried multiple different medications for her headaches and states that nothing seems to help.  She states that there are no known instigating factors but feels as though her headaches will come on around him times. This headache started 3 days ago, is on the right side of her for head and face and is associated with pain in and around her right eye. She denies any swelling or rashes and denies any overt visual changes. She has previous headaches which are exactly the same to this and has never been evaluated for ocular disorders though she has been seen by headache specialist and tried on multiple medications. Eventually they wanted to try Botox but she states her insurance would not pay for this she suffers to intermittent headaches. She has associated nausea, photosensitivity but denies weakness numbness or difficulty ambulating. There has been no fevers, stiff neck or any other complaints at this time. This headache has been waxing and waning for 3 days but overall fairly persistent and throbbing in nature  Patient is a 37 y.o. female presenting with headaches. The history is provided by the patient and medical records.  Headache   Past Medical History  Diagnosis Date  . Chronic back pain   . Fibroids   . Genital herpes   . Interstitial cystitis   . Ovarian cyst   . Wolff-Parkinson-White (WPW) syndrome   . Hx of hysterectomy   . Major depressive disorder   . PTSD (post-traumatic stress disorder)   . Arachnoid cyst   . Migraine   . Migraine   . Bipolar 1 disorder     Past Surgical History  Procedure Laterality Date  . Abdominal  hysterectomy      No family history on file.  History  Substance Use Topics  . Smoking status: Never Smoker   . Smokeless tobacco: Never Used  . Alcohol Use: No    OB History   Grav Para Term Preterm Abortions TAB SAB Ect Mult Living                  Review of Systems  Neurological: Positive for headaches.  All other systems reviewed and are negative.    Allergies  Latex  Home Medications   Current Outpatient Rx  Name  Route  Sig  Dispense  Refill  . citalopram (CELEXA) 20 MG tablet   Oral   Take 20 mg by mouth daily.         . cyclobenzaprine (FLEXERIL) 5 MG tablet   Oral   Take 5 mg by mouth 3 (three) times daily as needed for muscle spasms.         Marland Kitchen docusate sodium (COLACE) 100 MG capsule   Oral   Take 100 mg by mouth 2 (two) times daily.         Marland Kitchen gabapentin (NEURONTIN) 300 MG capsule   Oral   Take 300 mg by mouth 3 (three) times daily.          Marland Kitchen lamoTRIgine (LAMICTAL) 150 MG tablet   Oral   Take 150 mg by mouth daily.         . polyethylene glycol powder (GLYCOLAX/MIRALAX) powder  Oral   Take 17 g by mouth daily.         . propranolol (INDERAL) 10 MG tablet   Oral   Take 10 mg by mouth 2 (two) times daily.          . traMADol (ULTRAM) 50 MG tablet   Oral   Take 50 mg by mouth every 6 (six) hours as needed for pain.         . valACYclovir (VALTREX) 500 MG tablet   Oral   Take 500 mg by mouth daily as needed (for cold sores). Cold sores         . zolpidem (AMBIEN) 10 MG tablet   Oral   Take 15 mg by mouth at bedtime as needed for sleep. For sleep         . nitroGLYCERIN (NITROSTAT) 0.4 MG SL tablet   Sublingual   Place 0.4 mg under the tongue every 5 (five) minutes as needed for chest pain.           BP 118/62  Pulse 68  Temp(Src) 98.3 F (36.8 C) (Oral)  Resp 20  SpO2 98%  Physical Exam  Nursing note and vitals reviewed. Constitutional: She appears well-developed and well-nourished. No distress.  HENT:   Head: Normocephalic and atraumatic.  Mouth/Throat: Oropharynx is clear and moist. No oropharyngeal exudate.  Oropharynx is clear and moist, nasal passages are clear without discharge or turbinate swelling  Eyes: Conjunctivae and EOM are normal. Pupils are equal, round, and reactive to light. Right eye exhibits no discharge. Left eye exhibits no discharge. No scleral icterus.  Normal appearing conjunctiva, normal appearing pupils, normal extraocular movements.  Intraocular pressure checked with Tono-Pen after tetracaine administration. Intraocular pressure in the left and right eyes at approximately 8-9, no elevated pressure  Neck: Normal range of motion. Neck supple. No JVD present. No thyromegaly present.  Cardiovascular: Normal rate, regular rhythm, normal heart sounds and intact distal pulses.  Exam reveals no gallop and no friction rub.   No murmur heard. Pulmonary/Chest: Effort normal and breath sounds normal. No respiratory distress. She has no wheezes. She has no rales.  Abdominal: Soft. Bowel sounds are normal. She exhibits no distension and no mass. There is no tenderness.  Musculoskeletal: Normal range of motion. She exhibits no edema and no tenderness.  Lymphadenopathy:    She has no cervical adenopathy.  Neurological: She is alert. Coordination normal.  The patient is alert, follows commands, normal coordination, normal speech, cranial nerves III through XII are intact  Skin: Skin is warm and dry. No rash noted. No erythema.  Psychiatric: She has a normal mood and affect. Her behavior is normal.    ED Course  Procedures (including critical care time)  Labs Reviewed - No data to display No results found.   1. Headache       MDM  Overall the patient appears benign, she has normal vital signs including no fever or tachycardia, will evaluate for her intraocular pressure otherwise at this time appears stable and I doubt a secondary etiology of her headaches. Migraine cocktail  given.  The patient has increased relief with medications, states that she wants to go home, no other complaints at this time, no worry for glaucoma, patient stable.   Meds given in ED:  Medications  ketorolac (TORADOL) 30 MG/ML injection 30 mg (30 mg Intravenous Given 03/23/13 1013)  sodium chloride 0.9 % bolus 1,000 mL (1,000 mLs Intravenous New Bag/Given 03/23/13 1013)  metoCLOPramide (REGLAN) injection  10 mg (10 mg Intravenous Given 03/23/13 1013)  diphenhydrAMINE (BENADRYL) injection 25 mg (25 mg Intravenous Given 03/23/13 1013)  tetracaine (PONTOCAINE) 0.5 % ophthalmic solution 1 drop (1 drop Both Eyes Given 03/23/13 1037)    New Prescriptions   No medications on file      Vida Roller, MD 03/23/13 1242

## 2013-03-23 NOTE — ED Notes (Signed)
MD at bedside. 

## 2013-05-08 ENCOUNTER — Emergency Department (HOSPITAL_COMMUNITY): Payer: Medicaid Other

## 2013-05-08 ENCOUNTER — Encounter (HOSPITAL_COMMUNITY): Payer: Self-pay | Admitting: Cardiology

## 2013-05-08 ENCOUNTER — Emergency Department (HOSPITAL_COMMUNITY)
Admission: EM | Admit: 2013-05-08 | Discharge: 2013-05-08 | Disposition: A | Discharge status: Home / Self Care | Payer: Medicaid Other | Attending: Emergency Medicine | Admitting: Emergency Medicine

## 2013-05-08 DIAGNOSIS — Z3202 Encounter for pregnancy test, result negative: Secondary | ICD-10-CM | POA: Insufficient documentation

## 2013-05-08 DIAGNOSIS — F329 Major depressive disorder, single episode, unspecified: Secondary | ICD-10-CM | POA: Insufficient documentation

## 2013-05-08 DIAGNOSIS — Z8669 Personal history of other diseases of the nervous system and sense organs: Secondary | ICD-10-CM | POA: Insufficient documentation

## 2013-05-08 DIAGNOSIS — Z79899 Other long term (current) drug therapy: Secondary | ICD-10-CM | POA: Insufficient documentation

## 2013-05-08 DIAGNOSIS — Z8679 Personal history of other diseases of the circulatory system: Secondary | ICD-10-CM | POA: Insufficient documentation

## 2013-05-08 DIAGNOSIS — Z8659 Personal history of other mental and behavioral disorders: Secondary | ICD-10-CM | POA: Insufficient documentation

## 2013-05-08 DIAGNOSIS — R51 Headache: Secondary | ICD-10-CM | POA: Insufficient documentation

## 2013-05-08 DIAGNOSIS — R079 Chest pain, unspecified: Secondary | ICD-10-CM | POA: Insufficient documentation

## 2013-05-08 DIAGNOSIS — Z8619 Personal history of other infectious and parasitic diseases: Secondary | ICD-10-CM | POA: Insufficient documentation

## 2013-05-08 DIAGNOSIS — Z881 Allergy status to other antibiotic agents status: Secondary | ICD-10-CM | POA: Insufficient documentation

## 2013-05-08 DIAGNOSIS — R519 Headache, unspecified: Secondary | ICD-10-CM

## 2013-05-08 DIAGNOSIS — Z90710 Acquired absence of both cervix and uterus: Secondary | ICD-10-CM | POA: Insufficient documentation

## 2013-05-08 DIAGNOSIS — M549 Dorsalgia, unspecified: Secondary | ICD-10-CM | POA: Insufficient documentation

## 2013-05-08 DIAGNOSIS — Z8742 Personal history of other diseases of the female genital tract: Secondary | ICD-10-CM | POA: Insufficient documentation

## 2013-05-08 LAB — POCT I-STAT TROPONIN I: Troponin i, poc: 0 ng/mL (ref 0.00–0.08)

## 2013-05-08 LAB — URINALYSIS, ROUTINE W REFLEX MICROSCOPIC
Glucose, UA: NEGATIVE mg/dL
Leukocytes, UA: NEGATIVE
Nitrite: NEGATIVE
Specific Gravity, Urine: 1.033 — ABNORMAL HIGH (ref 1.005–1.030)
pH: 5.5 (ref 5.0–8.0)

## 2013-05-08 LAB — CBC
HCT: 38.2 % (ref 36.0–46.0)
Hemoglobin: 12.6 g/dL (ref 12.0–15.0)
MCHC: 33 g/dL (ref 30.0–36.0)
RBC: 4.5 MIL/uL (ref 3.87–5.11)

## 2013-05-08 LAB — BASIC METABOLIC PANEL
BUN: 12 mg/dL (ref 6–23)
CO2: 25 mEq/L (ref 19–32)
GFR calc non Af Amer: 70 mL/min — ABNORMAL LOW (ref >90)
Glucose, Bld: 91 mg/dL (ref 70–99)
Potassium: 3.9 mEq/L (ref 3.5–5.1)
Sodium: 141 mEq/L (ref 135–145)

## 2013-05-08 LAB — POCT PREGNANCY, URINE: Preg Test, Ur: NEGATIVE

## 2013-05-08 MED ORDER — KETOROLAC TROMETHAMINE 30 MG/ML IJ SOLN
30.0000 mg | Freq: Once | INTRAMUSCULAR | Status: AC
  Administered 2013-05-08: 30 mg via INTRAMUSCULAR
  Filled 2013-05-08: qty 1
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
## 2013-09-04 ENCOUNTER — Encounter (HOSPITAL_COMMUNITY): Payer: Self-pay | Admitting: Emergency Medicine

## 2013-09-04 ENCOUNTER — Emergency Department (HOSPITAL_COMMUNITY): Payer: Medicaid Other

## 2013-09-04 ENCOUNTER — Emergency Department (HOSPITAL_COMMUNITY)
Admission: EM | Admit: 2013-09-04 | Discharge: 2013-09-04 | Disposition: A | Discharge status: Home / Self Care | Payer: Medicaid Other | Attending: Emergency Medicine | Admitting: Emergency Medicine

## 2013-09-04 DIAGNOSIS — G43909 Migraine, unspecified, not intractable, without status migrainosus: Secondary | ICD-10-CM | POA: Insufficient documentation

## 2013-09-04 DIAGNOSIS — Z8619 Personal history of other infectious and parasitic diseases: Secondary | ICD-10-CM | POA: Insufficient documentation

## 2013-09-04 DIAGNOSIS — G8929 Other chronic pain: Secondary | ICD-10-CM | POA: Insufficient documentation

## 2013-09-04 DIAGNOSIS — Z87448 Personal history of other diseases of urinary system: Secondary | ICD-10-CM | POA: Insufficient documentation

## 2013-09-04 DIAGNOSIS — Z79899 Other long term (current) drug therapy: Secondary | ICD-10-CM | POA: Insufficient documentation

## 2013-09-04 DIAGNOSIS — H53149 Visual discomfort, unspecified: Secondary | ICD-10-CM | POA: Insufficient documentation

## 2013-09-04 DIAGNOSIS — Z91040 Latex allergy status: Secondary | ICD-10-CM | POA: Insufficient documentation

## 2013-09-04 DIAGNOSIS — Z8742 Personal history of other diseases of the female genital tract: Secondary | ICD-10-CM | POA: Insufficient documentation

## 2013-09-04 DIAGNOSIS — F4310 Post-traumatic stress disorder, unspecified: Secondary | ICD-10-CM | POA: Insufficient documentation

## 2013-09-04 DIAGNOSIS — R519 Headache, unspecified: Secondary | ICD-10-CM

## 2013-09-04 DIAGNOSIS — F314 Bipolar disorder, current episode depressed, severe, without psychotic features: Secondary | ICD-10-CM | POA: Insufficient documentation

## 2013-09-04 MED ORDER — DIPHENHYDRAMINE HCL 50 MG/ML IJ SOLN
25.0000 mg | Freq: Once | INTRAMUSCULAR | Status: AC
  Administered 2013-09-04: 25 mg via INTRAVENOUS
  Filled 2013-09-04: qty 1

## 2013-09-04 MED ORDER — KETOROLAC TROMETHAMINE 30 MG/ML IJ SOLN
30.0000 mg | Freq: Once | INTRAMUSCULAR | Status: AC
  Administered 2013-09-04: 30 mg via INTRAVENOUS
  Filled 2013-09-04: qty 1

## 2013-09-04 MED ORDER — METOCLOPRAMIDE HCL 5 MG/ML IJ SOLN
10.0000 mg | Freq: Once | INTRAMUSCULAR | Status: AC
  Administered 2013-09-04: 10 mg via INTRAVENOUS
  Filled 2013-09-04: qty 2

## 2013-09-04 MED ORDER — SODIUM CHLORIDE 0.9 % IV BOLUS (SEPSIS)
1000.0000 mL | Freq: Once | INTRAVENOUS | Status: AC
  Administered 2013-09-04: 1000 mL via INTRAVENOUS

## 2013-09-04 NOTE — ED Notes (Signed)
Pt here from home with c/o sob and stiff neck, pt is very anxious in triage sats are 99 % on room air

## 2013-09-04 NOTE — ED Notes (Signed)
Verified pts information when she checked in pt verified that her labels were correct.

## 2013-09-10 NOTE — ED Provider Notes (Signed)
CSN: [id number]     Arrival date & time 09/04/13  1205 History   First MD Initiated Contact with Patient 09/04/13 1226     Chief Complaint  Patient presents with  . Shortness of Breath   (Consider location/radiation/quality/duration/timing/severity/associated sxs/prior Treatment) HPI  37 year old female with headache. Patient has a past history of what she calls migraines. Current symptoms feel similar to prior headaches. Denies any trauma. This current headache started last night. Pain is diffuse and achy. Associated with some photophobia. No visual complaints otherwise. No acute numbness, tingling or loss strength. She feels like her neck is stiff, but not painful. No fevers or chills. No rash. Nausea, but no vomiting.  Past Medical History  Diagnosis Date  . Chronic back pain   . Fibroids   . Genital herpes   . Interstitial cystitis   . Ovarian cyst   . Wolff-Parkinson-White (WPW) syndrome   . Hx of hysterectomy   . Major depressive disorder   . PTSD (post-traumatic stress disorder)   . Arachnoid cyst   . Migraine   . Migraine   . Bipolar 1 disorder    Past Surgical History  Procedure Laterality Date  . Abdominal hysterectomy     History reviewed. No pertinent family history. History  Substance Use Topics  . Smoking status: Never Smoker   . Smokeless tobacco: Never Used  . Alcohol Use: No   OB History   Grav Para Term Preterm Abortions TAB SAB Ect Mult Living                 Review of Systems  All systems reviewed and negative, other than as noted in HPI.  Allergies  Latex  Home Medications   Current Outpatient Rx  Name  Route  Sig  Dispense  Refill  . citalopram (CELEXA) 40 MG tablet   Oral   Take 40 mg by mouth daily.         . cyclobenzaprine (FLEXERIL) 5 MG tablet   Oral   Take 5 mg by mouth 3 (three) times daily as needed for muscle spasms.         Marland Kitchen docusate sodium (COLACE) 100 MG capsule   Oral   Take 100 mg by mouth 2 (two) times  daily.         Marland Kitchen gabapentin (NEURONTIN) 800 MG tablet   Oral   Take 800 mg by mouth 3 (three) times daily.         Marland Kitchen lamoTRIgine (LAMICTAL) 200 MG tablet   Oral   Take 200 mg by mouth daily.         Marland Kitchen lamoTRIgine (LAMICTAL) 25 MG tablet   Oral   Take 25 mg by mouth daily.         Marland Kitchen lovastatin (MEVACOR) 20 MG tablet   Oral   Take 20 mg by mouth at bedtime.         . nitroGLYCERIN (NITROSTAT) 0.4 MG SL tablet   Sublingual   Place 0.4 mg under the tongue every 5 (five) minutes as needed for chest pain.         . polyethylene glycol powder (GLYCOLAX/MIRALAX) powder   Oral   Take 17 g by mouth daily.         . propranolol (INDERAL) 10 MG tablet   Oral   Take 10 mg by mouth 2 (two) times daily.          . traMADol (ULTRAM) 50 MG tablet   Oral  Take 50 mg by mouth every 6 (six) hours as needed for pain.         . valACYclovir (VALTREX) 500 MG tablet   Oral   Take 500 mg by mouth daily as needed (for cold sores). Cold sores         . zolpidem (AMBIEN) 10 MG tablet   Oral   Take 15 mg by mouth at bedtime as needed for sleep. For sleep          BP 120/71  Pulse 78  Temp(Src) 98.2 F (36.8 C) (Oral)  Resp 17  SpO2 99% Physical Exam  Nursing note and vitals reviewed. Constitutional: She is oriented to person, place, and time. She appears well-developed and well-nourished. No distress.  HENT:  Head: Normocephalic and atraumatic.  Eyes: Conjunctivae are normal. Right eye exhibits no discharge. Left eye exhibits no discharge.  Neck: Neck supple.  No nuchal rigidity  Cardiovascular: Normal rate, regular rhythm and normal heart sounds.  Exam reveals no gallop and no friction rub.   No murmur heard. Pulmonary/Chest: Effort normal and breath sounds normal. No respiratory distress.  Abdominal: Soft. She exhibits no distension. There is no tenderness.  Musculoskeletal: She exhibits no edema and no tenderness.  Neurological: She is alert and oriented to  person, place, and time. No cranial nerve deficit. She exhibits normal muscle tone. Coordination normal.  Speech is clear. Content is appropriate. Good finger to nose testing bilaterally.  Skin: Skin is warm and dry.  Psychiatric: She has a normal mood and affect. Her behavior is normal. Thought content normal.    ED Course  Procedures (including critical care time) Labs Review Labs Reviewed - No data to display Imaging Review No results found.  EKG Interpretation   None       MDM   1. Headache    37yF with ha. Suspect primary HA. Consider emergent secondary causes such as bleed, infectious or mass but doubt. There is no history of trauma. Pt has a nonfocal neurological exam. Afebrile and neck supple. No use of blood thinning medication. Consider ocular etiology such as acute angle closure glaucoma but doubt. Pt denies acute change in visual acuity and eye exam unremarkable. Doubt temporal arteritis given age, no temporal tenderness and temporal artery pulsations palpable. Doubt CO poisoning. No contacts with similar symptoms. Doubt venous thrombosis. Doubt carotid or vertebral arteries dissection. Symptoms improved with meds. Feel that can be safely discharged, but strict return precautions discussed. Outpt fu.     Raeford Razor, MD 09/10/13 [phone]

## 2013-09-12 ENCOUNTER — Emergency Department (HOSPITAL_COMMUNITY): Admission: EM | Admit: 2013-09-12 | Discharge: 2013-09-12 | Discharge status: Absent without leave | Payer: Self-pay

## 2014-02-05 ENCOUNTER — Ambulatory Visit: Payer: Medicaid Other | Attending: Anesthesiology

## 2014-02-05 DIAGNOSIS — M545 Low back pain, unspecified: Secondary | ICD-10-CM | POA: Insufficient documentation

## 2014-02-05 DIAGNOSIS — IMO0001 Reserved for inherently not codable concepts without codable children: Secondary | ICD-10-CM | POA: Insufficient documentation

## 2014-02-05 DIAGNOSIS — M542 Cervicalgia: Secondary | ICD-10-CM | POA: Insufficient documentation

## 2014-04-15 ENCOUNTER — Emergency Department (INDEPENDENT_AMBULATORY_CARE_PROVIDER_SITE_OTHER)
Admission: EM | Admit: 2014-04-15 | Discharge: 2014-04-15 | Disposition: A | Discharge status: Home / Self Care | Payer: Medicare Other | Source: Home / Self Care | Attending: Emergency Medicine | Admitting: Emergency Medicine

## 2014-04-15 ENCOUNTER — Encounter (HOSPITAL_COMMUNITY): Payer: Self-pay | Admitting: Emergency Medicine

## 2014-04-15 DIAGNOSIS — M2669 Other specified disorders of temporomandibular joint: Secondary | ICD-10-CM

## 2014-04-15 DIAGNOSIS — M26629 Arthralgia of temporomandibular joint, unspecified side: Secondary | ICD-10-CM

## 2014-04-15 HISTORY — DX: Major depressive disorder, single episode, unspecified: F32.9

## 2014-04-15 HISTORY — DX: Fibromyalgia: M79.7

## 2014-04-15 HISTORY — DX: Depression, unspecified: F32.A

## 2014-04-15 HISTORY — DX: Insomnia, unspecified: G47.00

## 2014-04-15 HISTORY — DX: Essential (primary) hypertension: I10

## 2014-04-15 MED ORDER — MELOXICAM 15 MG PO TABS: 15.0000 mg | ORAL_TABLET | Freq: Every day | ORAL | Status: DC

## 2014-04-15 MED ORDER — CYCLOBENZAPRINE HCL 5 MG PO TABS: ORAL_TABLET | ORAL | Status: DC

## 2014-04-15 NOTE — ED Provider Notes (Signed)
  Chief Complaint   Chief Complaint  Patient presents with  . Otalgia    History of Present Illness   Anna Serrano is a 38 year old female who's had a one-month history of bilateral ear pain. She was seen by her family physician in May and prescribed doxycycline. She had some irritation of her external ear on the left the external ear irritation went away but she continues to have the bilateral ear pain. There is no drainage and no diminished hearing or ear congestion. She denies any fever, chills, headache, stiff neck, nasal congestion, rhinorrhea, sore throat, adenopathy, or cough. She has not had any cracking or popping of the jaw joints. She denies any pain on mouth opening or pain with chewing or biting.  Review of Systems   Other than as noted above, the patient denies any of the following symptoms: Systemic:  No fevers or chills. Eye:  No redness, pain, discharge, itching, blurred vision, or diplopia. ENT:  No headache, nasal congestion, sneezing, itching, epistaxis, ear pain, decreased hearing, ringing in ears, vertigo, or tinnitus.  No oral lesions, sore throat, or hoarseness. Neck:  No neck pain or adenopathy. Skin:  No rash or itching.  [place]   Past medical history, family history, social history, meds, and allergies were reviewed.   Physical Examination     Vital signs:  BP 115/80  Pulse 82  Temp(Src) 98 F (36.7 C) (Oral)  Resp 18  SpO2 100% General:  Alert and oriented.  In no distress.  Skin warm and dry. Eye:  PERRL, full EOMs, lids and conjunctiva normal.   ENT:  TMs and canals clear.  Nasal mucosa not congested and without drainage.  Mucous membranes moist, no oral lesions, normal dentition, pharynx clear.  No cranial or facial pain to palplation. Has mild pain to palpation over the TMJs. Neck:  Supple, full ROM.  No adenopathy, tenderness or mass.  Thyroid normal. Lungs:  Breath sounds clear and equal bilaterally.  No wheezes, rales or rhonchi. Heart:  Rhythm  regular, without extrasystoles.  No gallops or murmers. Skin:  Clear, warm and dry.   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  Canals and TMs appear completely normal. Suspect the pain is TMJ in origin. Suggested she followup with a dentist. She may be grinding her teeth at night. The Celexa would be the most likely culprit for this. In the meantime suggested following a soft diet, anti-inflammatories, and muscle relaxants.  Plan    1.  Meds:  The following meds were prescribed:   Discharge Medication List as of 04/15/2014 12:24 PM    START taking these medications   Details  !! cyclobenzaprine (FLEXERIL) 5 MG tablet Take 1 at bedtime., Normal    meloxicam (MOBIC) 15 MG tablet Take 1 tablet (15 mg total) by mouth daily., Starting 04/15/2014, Until Discontinued, Normal     !! - Potential duplicate medications found. Please discuss with provider.      2.  Patient Education/Counseling:  The patient was given appropriate handouts, self care instructions, and instructed in symptomatic relief.    3.  Follow up:  The patient was told to follow up here if no better in 3 to 4 days, or sooner if becoming worse in any way, and given some red flag symptoms such as increasing ear pain, difficulty hearing, or discharge from the ear which would prompt immediate return.       Harden Mo, MD 04/15/14 2120

## 2014-04-15 NOTE — Discharge Instructions (Signed)
Jaw Range of Motion Exercises Do the exercises below to prevent problems opening your mouth after a jaw fracture, TMJ, or surgery. HOME CARE Warm up  Open your mouth as wide as possible for 15 seconds.  Then, close your mouth and rest. Repeat this 8 times. Exercises  Stick your jaw forward for 15 seconds. Rest your jaw. Repeat this 8 times.  Move your jaw right for 15 seconds. Rest your jaw and repeat 8 times.  Move your jaw left for 15 seconds. Rest your jaw and repeat 8 times.  Put your middle finger and thumb in your mouth. Push with your thumb on your top teeth and your middle finger on your bottom teeth opening your mouth.  Stretch your mouth open as far as possible. Repeat 8 times.  Chew gum when you are not doing exercises.  Use moist heat packs 3 to 4 times a day on your jaw area. Document Released: 10/22/2008 Document Revised: 02/01/2012 Document Reviewed: 10/22/2008 [facility] Patient Information 2014 [place]. Temporomandibular Problems  Temporomandibular joint (TMJ) dysfunction means there are problems with the joint between your jaw and your skull. This is a joint lined by cartilage like other joints in your body but also has a small disc in the joint which keeps the bones from rubbing on each other. These joints are like other joints and can get inflamed (sore) from arthritis and other problems. When this joint gets sore, it can cause headaches and pain in the jaw and the face. CAUSES  Usually the arthritic types of problems are caused by soreness in the joint. Soreness in the joint can also be caused by overuse. This may come from grinding your teeth. It may also come from mis-alignment in the joint. DIAGNOSIS Diagnosis of this condition can often be made by history and exam. Sometimes your caregiver may need X-rays or an MRI scan to determine the exact cause. It may be necessary to see your dentist to determine if your teeth and jaws are lined up  correctly. TREATMENT  Most of the time this problem is not serious; however, sometimes it can persist (become chronic). When this happens medications that will cut down on inflammation (soreness) help. Sometimes a shot of cortisone into the joint will be helpful. If your teeth are not aligned it may help for your dentist to make a splint for your mouth that can help this problem. If no physical problems can be found, the problem may come from tension. If tension is found to be the cause, biofeedback or relaxation techniques may be helpful. HOME CARE INSTRUCTIONS   Later in the day, applications of ice packs may be helpful. Ice can be used in a plastic bag with a towel around it to prevent frostbite to skin. This may be used about every 2 hours for 20 to 30 minutes, as needed while awake, or as directed by your caregiver.  Only take over-the-counter or prescription medicines for pain, discomfort, or fever as directed by your caregiver.  If physical therapy was prescribed, follow your caregiver's directions.  Wear mouth appliances as directed if they were given. Document Released: 08/04/2001 Document Revised: 02/01/2012 Document Reviewed: 11/11/2008 [facility] Patient Information 2014 [place], [place].

## 2014-04-15 NOTE — ED Notes (Signed)
Describes being treated for left otitis externa on 5/8 with course of doxycyline.  Completed abx; states outer ear improved, but continues with "pain inside".  Also has same internal ear pain in right side.

## 2014-05-23 ENCOUNTER — Emergency Department (HOSPITAL_COMMUNITY)
Admission: EM | Admit: 2014-05-23 | Discharge: 2014-05-23 | Disposition: A | Discharge status: Home / Self Care | Payer: Medicare Other | Source: Home / Self Care

## 2014-10-22 ENCOUNTER — Encounter (HOSPITAL_COMMUNITY)
Admission: RE | Admit: 2014-10-22 | Discharge: 2014-10-22 | Disposition: A | Discharge status: Home / Self Care | Payer: Medicare Other | Source: Ambulatory Visit | Attending: Oral and Maxillofacial Surgery | Admitting: Oral and Maxillofacial Surgery

## 2014-10-22 ENCOUNTER — Encounter (HOSPITAL_COMMUNITY): Payer: Self-pay

## 2014-10-22 DIAGNOSIS — F4310 Post-traumatic stress disorder, unspecified: Secondary | ICD-10-CM | POA: Diagnosis not present

## 2014-10-22 DIAGNOSIS — Z01818 Encounter for other preprocedural examination: Secondary | ICD-10-CM | POA: Insufficient documentation

## 2014-10-22 DIAGNOSIS — Z6841 Body Mass Index (BMI) 40.0 and over, adult: Secondary | ICD-10-CM | POA: Insufficient documentation

## 2014-10-22 DIAGNOSIS — I10 Essential (primary) hypertension: Secondary | ICD-10-CM | POA: Diagnosis not present

## 2014-10-22 DIAGNOSIS — G43909 Migraine, unspecified, not intractable, without status migrainosus: Secondary | ICD-10-CM | POA: Insufficient documentation

## 2014-10-22 HISTORY — DX: Unspecified osteoarthritis, unspecified site: M19.90

## 2014-10-22 HISTORY — DX: Anxiety disorder, unspecified: F41.9

## 2014-10-22 LAB — BASIC METABOLIC PANEL
Anion gap: 13 (ref 5–15)
BUN: 11 mg/dL (ref 6–23)
CO2: 20 mEq/L (ref 19–32)
Calcium: 9 mg/dL (ref 8.4–10.5)
Chloride: 106 mEq/L (ref 96–112)
Creatinine, Ser: 1.06 mg/dL (ref 0.50–1.10)
GFR calc Af Amer: 77 mL/min — ABNORMAL LOW (ref >90)
GFR calc non Af Amer: 66 mL/min — ABNORMAL LOW (ref >90)
Glucose, Bld: 97 mg/dL (ref 70–99)
POTASSIUM: 3.8 meq/L (ref 3.7–5.3)
SODIUM: 139 meq/L (ref 137–147)

## 2014-10-22 LAB — CBC
HCT: 36.5 % (ref 36.0–46.0)
Hemoglobin: 11.9 g/dL — ABNORMAL LOW (ref 12.0–15.0)
MCH: 27.9 pg (ref 26.0–34.0)
MCHC: 32.6 g/dL (ref 30.0–36.0)
MCV: 85.5 fL (ref 78.0–100.0)
PLATELETS: 315 10*3/uL (ref 150–400)
RBC: 4.27 MIL/uL (ref 3.87–5.11)
RDW: 13 % (ref 11.5–15.5)
WBC: 5.8 10*3/uL (ref 4.0–10.5)

## 2014-10-22 NOTE — Progress Notes (Signed)
Primary - martin NP States quit going to cardiologist (harwani) bc got second opinion and they said she no longer had WPW.   Had cath last year at [facility] and saw dr. Sydnee Cabal bedee for second opinion thinks she had ekg in dec 2014 will request  Main reason for second opinion was to find out if still had WPW so that she can take medications needed for migraines.

## 2014-10-22 NOTE — Progress Notes (Signed)
Anesthesia Chart Review:  Pt is 38 year old female scheduled for extraction of teeth #1, #16, #17, and #32 on 10/29/2014 with Dr. Tamela Oddi, DDS.   PMH: Wolff-Parkinson-White syndrome, HTN, PTSD. BMI 46.   Medications include: propranolol, lovastatin, imitrex, lamictal, wellbutrin, zoloft, trazodone, ambien, zonisamide.   Preoperative labs reviewed.    EKG 02/16/2014 from [facility]: NSR, anterior T wave abnormality.   Pt had EP study at [facility] on 11/20/2013 that ruled out WPW.  1. Full EPS performed including atrial, ventricular pacing with HIS measurement.  2. Ventricular pacing revealed no VA conduction. 3. 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  4. There was no evidence of ventricular preexcitation upon testing. Both at baseline and with adenosine provocation. During testing atrial fibrillation was induced but spontaneously terminated. The shortest PR interval during the atrial fibrillation was 360 ms.  5. Further testing during the washout period of isuprel failed to induce any tachyarrhythmias.   Had exercise stress test 11/25/2012: Normal stress test.   If no changes, I anticipate pt can proceed with surgery as scheduled.   Willeen Cass, FNP-BC [facility]/Anesthesiology Phone: [phone] 10/22/2014 4:14 PM

## 2014-10-22 NOTE — Pre-Procedure Instructions (Signed)
Anna Serrano  10/22/2014   Your procedure is scheduled on:  Monday, December 7th  Report to [place] at 630 AM.  Call this number if you have problems the morning of surgery: [phone]   Remember:   Do not eat food or drink liquids after midnight.   Take these medicines the morning of surgery with A SIP OF WATER: celexa, cymbalta, neurontin, lamictal, propranolol   Do not wear jewelry, make-up or nail polish.  Do not wear lotions, powders, or perfumes. You may wear deodorant.  Do not shave 48 hours prior to surgery. Men may shave face and neck.  Do not bring valuables to the hospital.  [facility] is not responsible  for any belongings or valuables.               Contacts, dentures or bridgework may not be worn into surgery.  Leave suitcase in the car. After surgery it may be brought to your room.  For patients admitted to the hospital, discharge time is determined by your treatment team.               Patients discharged the day of surgery will not be allowed to drive home.  Please read over the following fact sheets that you were given: Pain Booklet, Coughing and Deep Breathing and Surgical Site Infection Prevention  [place] - Preparing for Surgery  Before surgery, you can play an important role.  Because skin is not sterile, your skin needs to be as free of germs as possible.  You can reduce the number of germs on you skin by washing with CHG (chlorahexidine gluconate) soap before surgery.  CHG is an antiseptic cleaner which kills germs and bonds with the skin to continue killing germs even after washing.  Please DO NOT use if you have an allergy to CHG or antibacterial soaps.  If your skin becomes reddened/irritated stop using the CHG and inform your nurse when you arrive at Short Stay.  Do not shave (including legs and underarms) for at least 48 hours prior to the first CHG shower.  You may shave your face.  Please follow these instructions  carefully:   1.  Shower with CHG Soap the night before surgery and the morning of Surgery.  2.  If you choose to wash your hair, wash your hair first as usual with your normal shampoo.  3.  After you shampoo, rinse your hair and body thoroughly to remove the shampoo.  4.  Use CHG as you would any other liquid soap.  You can apply CHG directly to the skin and wash gently with scrungie or a clean washcloth.  5.  Apply the CHG Soap to your body ONLY FROM THE NECK DOWN.  Do not use on open wounds or open sores.  Avoid contact with your eyes, ears, mouth and genitals (private parts).  Wash genitals (private parts) with your normal soap.  6.  Wash thoroughly, paying special attention to the area where your surgery will be performed.  7.  Thoroughly rinse your body with warm water from the neck down.  8.  DO NOT shower/wash with your normal soap after using and rinsing off the CHG Soap.  9.  Pat yourself dry with a clean towel.            10.  Wear clean pajamas.            11.  Place clean sheets on your bed the night  of your first shower and do not sleep with pets.  Day of Surgery  Do not apply any lotions/deoderants the morning of surgery.  Please wear clean clothes to the hospital/surgery center.

## 2014-10-28 ENCOUNTER — Encounter (HOSPITAL_COMMUNITY): Payer: Self-pay | Admitting: Oral and Maxillofacial Surgery

## 2014-10-28 NOTE — H&P (Signed)
Anna Serrano is an 38 y.o. female.   Chief Complaint: "My dentist recommends that I have my wisdom teeth removed" HPI: Anna Serrano is a 38 year old female that was referred to our office for removal of her third molars.  She reports a distant history of having Wolff-Parkinson-White Syndrome, but explains that it was ruled out in the past.  Given her potential past  Cardiac history, it was still deemed appropriate for her surgery to be performed in the operating room setting.  PMHx:  Past Medical History  Diagnosis Date  . Chronic back pain   . Fibroids   . Genital herpes   . Interstitial cystitis   . Ovarian cyst   . Wolff-Parkinson-White (WPW) syndrome     Had EP study 11/20/2013 at [facility] that ruled out WPW syndrome.   Marland Kitchen Hx of hysterectomy   . Major depressive disorder   . PTSD (post-traumatic stress disorder)   . Arachnoid cyst   . Bipolar 1 disorder   . Hypertension   . Fibromyalgia   . Insomnia   . Depression   . Anxiety   . Migraine     gets "all the time"  . Migraine   . Arthritis     PSx:  Past Surgical History  Procedure Laterality Date  . Abdominal hysterectomy    . Cardiac catheterization      Family Hx: History reviewed. No pertinent family history.  Social History:  reports that she has never smoked. She has never used smokeless tobacco. She reports that she does not drink alcohol or use illicit drugs.  Allergies:  Allergies  Allergen Reactions  . Latex Rash    Meds:  No prescriptions prior to admission    Labs: No results found for this or any previous visit (from the past 48 hour(s)).  Radiology: No results found.  ROS: Pertinent items are noted in HPI.  Vitals: There were no vitals taken for this visit.  Physical Exam: General appearance: alert and cooperative Head: Normocephalic, without obvious abnormality, atraumatic Eyes: conjunctivae/corneas clear. PERRL, EOM's intact. Fundi benign. Ears: normal TM's and external ear canals both ears Nose:  Nares normal. Septum midline. Mucosa normal. No drainage or sinus tenderness. Throat: lips, mucosa, and tongue normal; teeth and gums normal Neck: no adenopathy, no carotid bruit, no JVD, supple, symmetrical, trachea midline and thyroid not enlarged, symmetric, no tenderness/mass/nodules Resp: clear to auscultation bilaterally Cardio: regular rate and rhythm, S1, S2 normal, no murmur, click, rub or gallop GI: soft, non-tender; bowel sounds normal; no masses,  no organomegaly Extremities: extremities normal, atraumatic, no cyanosis or edema Pulses: 2+ and symmetric Skin: Skin color, texture, turgor normal. No rashes or lesions Lymph nodes: Cervical, supraclavicular, and axillary nodes normal. Neurologic: Alert and oriented X 3, normal strength and tone. Normal symmetric reflexes. Normal coordination and gait Non-visible third molars #1, 16, 17, 32.     Radiographic exam: Panorex and CBCT scan performed in office - show full bony impacted third molars #1, 16, 17, and 32.  The mandibular teeth are in close proximity to the inferior alveolar canal.  The maxillary teeth are in close approximation to the bilateral maxillary sinuses.  Assessment/Plan Ms. Lucchetti has full bony impacted #1, 16, 17, 32 which require removal. Given her remote history of Wolff-Parkinson-White Syndrome she will be taken to the operating room for removal of the third molars.  Of note: the patient was formally ruled out for WPW in 2014.    Black,Kenyon Eshleman L  10/28/2014, 11:54  PM   

## 2014-10-29 ENCOUNTER — Encounter (HOSPITAL_COMMUNITY)
Admission: RE | Disposition: A | Discharge status: Home / Self Care | Payer: Self-pay | Source: Ambulatory Visit | Attending: Oral and Maxillofacial Surgery

## 2014-10-29 ENCOUNTER — Encounter (HOSPITAL_COMMUNITY): Payer: Self-pay | Admitting: *Deleted

## 2014-10-29 ENCOUNTER — Ambulatory Visit (HOSPITAL_COMMUNITY): Payer: Medicare Other | Admitting: Anesthesiology

## 2014-10-29 ENCOUNTER — Ambulatory Visit (HOSPITAL_COMMUNITY): Payer: Medicare Other | Admitting: Emergency Medicine

## 2014-10-29 ENCOUNTER — Ambulatory Visit (HOSPITAL_COMMUNITY)
Admission: RE | Admit: 2014-10-29 | Discharge: 2014-10-29 | Disposition: A | Discharge status: Home / Self Care | Payer: Medicare Other | Source: Ambulatory Visit | Attending: Oral and Maxillofacial Surgery | Admitting: Oral and Maxillofacial Surgery

## 2014-10-29 DIAGNOSIS — F418 Other specified anxiety disorders: Secondary | ICD-10-CM | POA: Diagnosis not present

## 2014-10-29 DIAGNOSIS — I456 Pre-excitation syndrome: Secondary | ICD-10-CM | POA: Diagnosis not present

## 2014-10-29 DIAGNOSIS — Z6841 Body Mass Index (BMI) 40.0 and over, adult: Secondary | ICD-10-CM | POA: Diagnosis not present

## 2014-10-29 DIAGNOSIS — I10 Essential (primary) hypertension: Secondary | ICD-10-CM | POA: Insufficient documentation

## 2014-10-29 DIAGNOSIS — M549 Dorsalgia, unspecified: Secondary | ICD-10-CM | POA: Diagnosis not present

## 2014-10-29 DIAGNOSIS — M797 Fibromyalgia: Secondary | ICD-10-CM | POA: Diagnosis not present

## 2014-10-29 DIAGNOSIS — F319 Bipolar disorder, unspecified: Secondary | ICD-10-CM | POA: Diagnosis not present

## 2014-10-29 DIAGNOSIS — G8929 Other chronic pain: Secondary | ICD-10-CM | POA: Insufficient documentation

## 2014-10-29 DIAGNOSIS — M1990 Unspecified osteoarthritis, unspecified site: Secondary | ICD-10-CM | POA: Diagnosis not present

## 2014-10-29 DIAGNOSIS — K011 Impacted teeth: Secondary | ICD-10-CM | POA: Insufficient documentation

## 2014-10-29 DIAGNOSIS — F4310 Post-traumatic stress disorder, unspecified: Secondary | ICD-10-CM | POA: Diagnosis not present

## 2014-10-29 DIAGNOSIS — E669 Obesity, unspecified: Secondary | ICD-10-CM | POA: Diagnosis not present

## 2014-10-29 DIAGNOSIS — G43909 Migraine, unspecified, not intractable, without status migrainosus: Secondary | ICD-10-CM | POA: Diagnosis not present

## 2014-10-29 DIAGNOSIS — G4700 Insomnia, unspecified: Secondary | ICD-10-CM | POA: Diagnosis not present

## 2014-10-29 HISTORY — PX: TOOTH EXTRACTION: SHX859

## 2014-10-29 SURGERY — EXTRACTION, TOOTH, MOLAR
Anesthesia: General | Site: Mouth

## 2014-10-29 MED ORDER — OXYMETAZOLINE HCL 0.05 % NA SOLN
NASAL | Status: AC
  Filled 2014-10-29: qty 15

## 2014-10-29 MED ORDER — HYDROMORPHONE HCL 1 MG/ML IJ SOLN
0.2500 mg | INTRAMUSCULAR | Status: DC | PRN
  Administered 2014-10-29 (×3): 0.5 mg via INTRAVENOUS

## 2014-10-29 MED ORDER — SUCCINYLCHOLINE CHLORIDE 20 MG/ML IJ SOLN
INTRAMUSCULAR | Status: DC | PRN
  Administered 2014-10-29: 120 mg via INTRAVENOUS

## 2014-10-29 MED ORDER — FENTANYL CITRATE 0.05 MG/ML IJ SOLN
INTRAMUSCULAR | Status: AC
  Filled 2014-10-29: qty 5

## 2014-10-29 MED ORDER — HYDROMORPHONE HCL 1 MG/ML IJ SOLN
INTRAMUSCULAR | Status: AC
  Filled 2014-10-29: qty 1

## 2014-10-29 MED ORDER — ONDANSETRON HCL 4 MG/2ML IJ SOLN: 4.0000 mg | Freq: Four times a day (QID) | INTRAMUSCULAR | Status: DC | PRN

## 2014-10-29 MED ORDER — MIDAZOLAM HCL 2 MG/2ML IJ SOLN
INTRAMUSCULAR | Status: AC
  Filled 2014-10-29: qty 2

## 2014-10-29 MED ORDER — PROPOFOL 10 MG/ML IV BOLUS
INTRAVENOUS | Status: AC
  Filled 2014-10-29: qty 20

## 2014-10-29 MED ORDER — HEMOSTATIC AGENTS (NO CHARGE) OPTIME
TOPICAL | Status: DC | PRN
  Administered 2014-10-29: 1 via TOPICAL

## 2014-10-29 MED ORDER — PROPOFOL 10 MG/ML IV BOLUS
INTRAVENOUS | Status: DC | PRN
  Administered 2014-10-29: 190 mg via INTRAVENOUS

## 2014-10-29 MED ORDER — LIDOCAINE HCL (PF) 2 % IJ SOLN
INTRAMUSCULAR | Status: DC | PRN
  Administered 2014-10-29 (×6): 1.7 mL

## 2014-10-29 MED ORDER — LIDOCAINE-EPINEPHRINE 2 %-1:100000 IJ SOLN
INTRAMUSCULAR | Status: AC
  Filled 2014-10-29: qty 10.2

## 2014-10-29 MED ORDER — OXYCODONE HCL 5 MG/5ML PO SOLN: 5.0000 mg | Freq: Once | ORAL | Status: DC | PRN

## 2014-10-29 MED ORDER — FENTANYL CITRATE 0.05 MG/ML IJ SOLN
INTRAMUSCULAR | Status: DC | PRN
  Administered 2014-10-29 (×4): 50 ug via INTRAVENOUS

## 2014-10-29 MED ORDER — LIDOCAINE HCL (CARDIAC) 20 MG/ML IV SOLN
INTRAVENOUS | Status: AC
  Filled 2014-10-29: qty 5

## 2014-10-29 MED ORDER — BUPIVACAINE-EPINEPHRINE (PF) 0.5% -1:200000 IJ SOLN
INTRAMUSCULAR | Status: DC | PRN
  Administered 2014-10-29 (×4): 1.8 mL

## 2014-10-29 MED ORDER — LIDOCAINE HCL (CARDIAC) 20 MG/ML IV SOLN
INTRAVENOUS | Status: DC | PRN
  Administered 2014-10-29: 80 mg via INTRAVENOUS

## 2014-10-29 MED ORDER — ONDANSETRON HCL 4 MG/2ML IJ SOLN
INTRAMUSCULAR | Status: DC | PRN
  Administered 2014-10-29: 4 mg via INTRAVENOUS

## 2014-10-29 MED ORDER — GLYCOPYRROLATE 0.2 MG/ML IJ SOLN
INTRAMUSCULAR | Status: AC
  Filled 2014-10-29: qty 3

## 2014-10-29 MED ORDER — ONDANSETRON HCL 4 MG/2ML IJ SOLN
INTRAMUSCULAR | Status: AC
  Filled 2014-10-29: qty 2

## 2014-10-29 MED ORDER — MIDAZOLAM HCL 5 MG/5ML IJ SOLN
INTRAMUSCULAR | Status: DC | PRN
  Administered 2014-10-29: 2 mg via INTRAVENOUS

## 2014-10-29 MED ORDER — CLINDAMYCIN PHOSPHATE 600 MG/50ML IV SOLN
INTRAVENOUS | Status: AC
  Filled 2014-10-29: qty 50

## 2014-10-29 MED ORDER — LIDOCAINE HCL 4 % MT SOLN
OROMUCOSAL | Status: DC | PRN
  Administered 2014-10-29: 4 mL via TOPICAL

## 2014-10-29 MED ORDER — LACTATED RINGERS IV SOLN
INTRAVENOUS | Status: DC | PRN
  Administered 2014-10-29 (×2): via INTRAVENOUS

## 2014-10-29 MED ORDER — 0.9 % SODIUM CHLORIDE (POUR BTL) OPTIME
TOPICAL | Status: DC | PRN
  Administered 2014-10-29: 1000 mL

## 2014-10-29 MED ORDER — DEXAMETHASONE SODIUM PHOSPHATE 4 MG/ML IJ SOLN
INTRAMUSCULAR | Status: DC | PRN
  Administered 2014-10-29: 8 mg via INTRAVENOUS

## 2014-10-29 MED ORDER — NEOSTIGMINE METHYLSULFATE 10 MG/10ML IV SOLN
INTRAVENOUS | Status: AC
  Filled 2014-10-29: qty 1

## 2014-10-29 MED ORDER — OXYCODONE HCL 5 MG PO TABS: 5.0000 mg | ORAL_TABLET | Freq: Once | ORAL | Status: DC | PRN

## 2014-10-29 MED ORDER — ARTIFICIAL TEARS OP OINT
TOPICAL_OINTMENT | OPHTHALMIC | Status: DC | PRN
  Administered 2014-10-29: 1 via OPHTHALMIC

## 2014-10-29 MED ORDER — PROPRANOLOL HCL 10 MG PO TABS
10.0000 mg | ORAL_TABLET | Freq: Once | ORAL | Status: AC
  Administered 2014-10-29: 10 mg via ORAL
  Filled 2014-10-29: qty 1

## 2014-10-29 MED ORDER — SUCCINYLCHOLINE CHLORIDE 20 MG/ML IJ SOLN
INTRAMUSCULAR | Status: AC
  Filled 2014-10-29: qty 1

## 2014-10-29 MED ORDER — BUPIVACAINE-EPINEPHRINE (PF) 0.5% -1:200000 IJ SOLN
INTRAMUSCULAR | Status: AC
  Filled 2014-10-29: qty 7.2

## 2014-10-29 MED ORDER — ROCURONIUM BROMIDE 50 MG/5ML IV SOLN
INTRAVENOUS | Status: AC
  Filled 2014-10-29: qty 1

## 2014-10-29 MED ORDER — CLINDAMYCIN PHOSPHATE 600 MG/50ML IV SOLN
600.0000 mg | Freq: Once | INTRAVENOUS | Status: AC
  Administered 2014-10-29: 600 mg via INTRAVENOUS

## 2014-10-29 SURGICAL SUPPLY — 35 items
ALCOHOL ISOPROPYL (RUBBING) (MISCELLANEOUS) ×2 IMPLANT
ATTRACTOMAT 16X20 MAGNETIC DRP (DRAPES) ×2 IMPLANT
BLADE SURG 15 STRL LF DISP TIS (BLADE) ×2 IMPLANT
BLADE SURG 15 STRL SS (BLADE) ×4
BUR CROSS CUT FISSURE 1.6 (BURR) ×1 IMPLANT
BUR RND FLUTED 2.5 (BURR) ×1 IMPLANT
CANISTER SUCTION 2500CC (MISCELLANEOUS) ×2 IMPLANT
COVER SURGICAL LIGHT HANDLE (MISCELLANEOUS) ×2 IMPLANT
GAUZE PACKING FOLDED 2  STR (GAUZE/BANDAGES/DRESSINGS) ×1
GAUZE PACKING FOLDED 2 STR (GAUZE/BANDAGES/DRESSINGS) ×1 IMPLANT
GAUZE SPONGE 4X4 12PLY STRL (GAUZE/BANDAGES/DRESSINGS) IMPLANT
GLOVE BIOGEL PI IND STRL 6.5 (GLOVE) IMPLANT
GLOVE BIOGEL PI IND STRL 7.5 (GLOVE) ×1 IMPLANT
GLOVE BIOGEL PI INDICATOR 6.5 (GLOVE) ×1
GLOVE BIOGEL PI INDICATOR 7.5 (GLOVE) ×1
GLOVE ORTHO TXT STRL SZ7.5 (GLOVE) ×2 IMPLANT
GLOVE SURG SS PI 6.0 STRL IVOR (GLOVE) ×1 IMPLANT
GOWN STRL REUS W/ TWL LRG LVL3 (GOWN DISPOSABLE) ×2 IMPLANT
GOWN STRL REUS W/TWL LRG LVL3 (GOWN DISPOSABLE) ×4
KIT BASIN OR (CUSTOM PROCEDURE TRAY) ×2 IMPLANT
KIT ROOM TURNOVER OR (KITS) ×2 IMPLANT
NDL BLUNT 16X1.5 OR ONLY (NEEDLE) ×1 IMPLANT
NDL DENTAL 27 LONG (NEEDLE) ×2 IMPLANT
NEEDLE BLUNT 16X1.5 OR ONLY (NEEDLE) ×2 IMPLANT
NEEDLE DENTAL 27 LONG (NEEDLE) ×4 IMPLANT
NS IRRIG 1000ML POUR BTL (IV SOLUTION) ×2 IMPLANT
PAD ARMBOARD 7.5X6 YLW CONV (MISCELLANEOUS) ×4 IMPLANT
SOLUTION BETADINE 4OZ (MISCELLANEOUS) ×1 IMPLANT
SPONGE GAUZE 4X4 12PLY STER LF (GAUZE/BANDAGES/DRESSINGS) ×1 IMPLANT
SPONGE SURGIFOAM ABS GEL 12-7 (HEMOSTASIS) ×1 IMPLANT
SUT CHROMIC 3 0 PS 2 (SUTURE) ×3 IMPLANT
SYR 50ML SLIP (SYRINGE) ×2 IMPLANT
TOOTHBRUSH ADULT (PERSONAL CARE ITEMS) ×2 IMPLANT
TOWEL OR 17X24 6PK STRL BLUE (TOWEL DISPOSABLE) ×2 IMPLANT
TRAY ENT MC OR (CUSTOM PROCEDURE TRAY) ×2 IMPLANT

## 2014-10-29 NOTE — Progress Notes (Signed)
Dr Lorrene Reid here to see pt

## 2014-10-29 NOTE — Anesthesia Preprocedure Evaluation (Addendum)
Anesthesia Evaluation  Patient identified by MRN, date of birth, ID band Patient awake    Reviewed: Allergy & Precautions, H&P , NPO status , Patient's Chart, lab work & pertinent test results  Airway Mallampati: II  TM Distance: >3 FB Neck ROM: full    Dental  (+) Teeth Intact, Dental Advisory Given   Pulmonary neg pulmonary ROS,          Cardiovascular hypertension,     Neuro/Psych  Headaches, Anxiety Depression Bipolar Disorder PTSD Neuromuscular disease    GI/Hepatic   Endo/Other  obese  Renal/GU      Musculoskeletal  (+) Arthritis -, Fibromyalgia -  Abdominal   Peds  Hematology   Anesthesia Other Findings   Reproductive/Obstetrics                            Anesthesia Physical Anesthesia Plan  ASA: II  Anesthesia Plan: General   Post-op Pain Management:    Induction: Intravenous  Airway Management Planned: Nasal ETT  Additional Equipment:   Intra-op Plan:   Post-operative Plan: Extubation in OR  Informed Consent: I have reviewed the patients History and Physical, chart, labs and discussed the procedure including the risks, benefits and alternatives for the proposed anesthesia with the patient or authorized representative who has indicated his/her understanding and acceptance.     Plan Discussed with: CRNA, Anesthesiologist and Surgeon  Anesthesia Plan Comments:         Anesthesia Quick Evaluation

## 2014-10-29 NOTE — Anesthesia Postprocedure Evaluation (Signed)
Anesthesia Post Note  Patient: Anna Serrano  Procedure(s) Performed: Procedure(s) (LRB): EXTRACTION OF TEETH #1, #16, #17 & #32 (N/A)  Anesthesia type: General  Patient location: PACU  Post pain: Pain level controlled and Adequate analgesia  Post assessment: Post-op Vital signs reviewed, Patient's Cardiovascular Status Stable, Respiratory Function Stable, Patent Airway and Pain level controlled  Last Vitals:  Filed Vitals:   10/29/14 1130  BP:   Pulse: 77  Temp:   Resp:     Post vital signs: Reviewed and stable  Level of consciousness: awake, alert  and oriented  Complications: No apparent anesthesia complications

## 2014-10-29 NOTE — Transfer of Care (Signed)
Immediate Anesthesia Transfer of Care Note  Patient: Anna Serrano  Procedure(s) Performed: Procedure(s): EXTRACTION OF TEETH #1, #16, #17 & #32 (N/A)  Patient Location: PACU  Anesthesia Type:General  Level of Consciousness: awake  Airway & Oxygen Therapy: Patient Spontanous Breathing and Patient connected to nasal cannula oxygen  Post-op Assessment: Report given to PACU RN and Post -op Vital signs reviewed and stable  Post vital signs: Reviewed and stable  Complications: No apparent anesthesia complications

## 2014-10-29 NOTE — Anesthesia Procedure Notes (Addendum)
Procedure Name: Intubation Date/Time: 10/29/2014 8:03 AM Performed by: Suzy Bouchard Pre-anesthesia Checklist: Patient identified, Emergency Drugs available, Suction available, Patient being monitored and Timeout performed Patient Re-evaluated:Patient Re-evaluated prior to inductionOxygen Delivery Method: Circle system utilized Preoxygenation: Pre-oxygenation with 100% oxygen Intubation Type: IV induction Ventilation: Mask ventilation without difficulty Laryngoscope Size: Miller and 2 Grade View: Grade I Nasal Tubes: Right, Nasal Rae and Magill forceps- large, utilized Number of attempts: 1 Placement Confirmation: ETT inserted through vocal cords under direct vision,  breath sounds checked- equal and bilateral and positive ETCO2 Tube secured with: Tape Dental Injury: Teeth and Oropharynx as per pre-operative assessment

## 2014-10-29 NOTE — Interval H&P Note (Signed)
History and Physical Interval Note:  10/29/2014 7:13 AM  Anna Serrano  has presented today for surgery, with the diagnosis of impacted third molars/Wolff Parkinson White Syndrome  The various methods of treatment have been discussed with the patient and family. After consideration of risks, benefits and other options for treatment, the patient has consented to  Procedure(s): EXTRACTION OF TEETH #1, #16, #17 & #32 (N/A) as a surgical intervention .  The patient's history has been reviewed, patient examined, no change in status, stable for surgery.  I have reviewed the patient's chart and labs.  Questions were answered to the patient's satisfaction.     [place],Oralee Rapaport L

## 2014-10-29 NOTE — Discharge Instructions (Signed)
What to Eat after Tooth extraction:   ° ° °For your first meals, you should eat lightly; only small meals at first.   Avoid Sharp, Crunchy, and Hot foods.   If you do not have nausea, you may eat larger meals.  Avoid spicy, greasy and heavy food, as these may make you sick after the anesthesia.  °. ° °Sinus precautions after surgery:  °1. Avoid blowing your nose.  °2. Avoid sneezing. If you might sneeze, Keep her mouth open and do not pinch your nose closed.  °3. Avoid sucking on straws. Avoid smoking.  °4. Do not lift objects weighing more than 20 pounds  °Call if questions arise.  ° °MOUTH CARE AFTER SURGERY  °FACTS:  °Ice used in ice bag helps keep the swelling down, and can help lessen the pain.  °It is easier to treat pain BEFORE it happens.  °Spitting disturbs the clot and may cause bleeding to start again, or to get worse.  °Smoking delays healing and can cause complications.  °Sharing prescriptions can be dangerous. Do not take medications not recently prescribed for you.  °Antibiotics may stop birth control pills from working. Use other means of birth control while on antibiotics.  °Warm salt water rinses after the first 24 hours will help lessen the swelling: Use 1/2 teaspoonful of table salt per oz.of water. °DO NOT:  °Do not spit. Do not drink through a straw.  °Strongly advised not to smoke, dip snuff or chew tobacco at least for 3 days.  °Do not eat sharp or crunchy foods. Avoid the area of surgery when chewing.  °Do not stop your antibiotics before your instructions say to do so.  °Do not eat hot foods until bleeding has stopped. If you need to, let your food cool down to room temperature. °EXPECT:  °Some swelling, especially first 2-3 days.  °Soreness or discomfort in varying degrees. Follow your dentist's instructions about how to handle pain before it starts.  °Pinkish saliva or light blood in saliva, or on your pillow in the morning. This can last around 24 hours.  °Bruising inside or outside the  mouth. This may not show up until 2-3 days after surgery. Don't worry, it will go away in time.  °Pieces of "bone" may work themselves loose. It's OK. If they bother you, let us know. °WHAT TO DO IMMEDIATELY AFTER SURGERY:  °Bite on the gauze with steady pressure for 1-2 hours. Don't chew on the gauze.  °Do not lie down flat. Raise your head support especially for the first 24 hours.  °Apply ice to your face on the side of the surgery. You may apply it 20 minutes on and a few minutes off. Ice for 8-12 hours. You may use ice up to 24 hours.  °Before the numbness wears off, take a pain pill as instructed.  °Prescription pain medication is not always required. °SWELLING:  °Expect swelling for the first couple of days. It should get better after that.  °If swelling increases 3 days or so after surgery; let us know as soon as possible. °FEVER:  °Take Tylenol every 4 hours if needed to lower your temperature, especially if it is at 100F or higher.  °Drink lots of fluids.  °If the fever does not go away, let us know. °BREATHING TROUBLE:  °Any unusual difficulty breathing means you have to have someone bring you to the emergency room ASAP °BLEEDING:  °Light oozing is expected for 24 hours or so.  °Prop head up   with pillows  °Avoid spitting  °Do not confuse bright red fresh flowing blood with lots of saliva colored with a little bit of blood.  °If you notice some bleeding, place gauze or a tea bag where it is bleeding and apply CONSTANT pressure by biting down for 1 hour. Avoid talking during this time. Do not remove the gauze or tea bag during this hour to "check" the bleeding.  °If you notice bright RED bleeding FLOWING out of particular area, and filling the floor of your mouth, put a wad of gauze on that area, bite down firmly and constantly. Call us immediately. If we're closed, have someone bring you to the emergency room. °ORAL HYGIENE:  °Brush your teeth as usual after meals and before bedtime.  °Use a soft  toothbrush around the area of surgery.  °DO NOT AVOID BRUSHING. Otherwise bacteria(germs) will grow and may delay healing or encourage infection.  °Since you cannot spit, just gently rinse and let the water flow out of your mouth.  °DO NOT SWISH HARD. °EATING:  °Cool liquids are a good point to start. Increase to soft foods as tolerated. °PRESCRIPTIONS:  °Follow the directions for your prescriptions exactly as written.  °If your doctor gave you a narcotic pain medication, do not drive, operate machinery or drink alcohol when on that medication. °QUESTIONS:  °Call our office during office hours ° ° °

## 2014-10-29 NOTE — Op Note (Signed)
10/29/2014  7:38 AM  PATIENT:  Anna Serrano  38 y.o. female  PRE-OPERATIVE DIAGNOSIS:  Impacted third molars/Wolff Parkinson White Syndrome  POST-OPERATIVE DIAGNOSIS:  Impacted third molars/Wolff Parkinson White Syndrome  PROCEDURE:  Procedure(s): EXTRACTION OF TEETH #1, #16, #17 & #32  INDICATIONS FOR PROCEDURE: The patient is a 38 year old female referred to our office for impacted third molars.  At the consultation the patient reported a history of Wolff-Parkinson-White Syndrome.  Due to this the patient was deemed necessary to have her procedure performed in a hospital setting.    SURGEON:  Surgeon(s): Isac Caddy, DDS  PHYSICIAN ASSISTANT: None  ASSISTANTS: none   ANESTHESIA:   general   PROCEDURE IN DETAIL: The patient was seen in the pre-operative area with the family.  The history and physical was updated, and the consent was signed verified.   The patient was taken to the room by the anesthesia service.  The patient was placed on the OR table in a supine position and intubated.  The patient was prepped and draped for oral surgery.  Next, a moistened throat pack was placed into the patient's oropharynx.  Local anesthetic was provided at each site.  Bilateral inferior alveolar nerve blocks and bilateral buccal infiltrations on the mandible and bilateral buccal and palatal infiltrations provided on the maxilla.  Next, a 15 blade was used to make a full thickness mucoperiosteal incision at site #17.  Next, a bur was used to remove bone and section the tooth.  The tooth pieces were removed with a straight elevator.  The procedure was repeated for all other sites #1, 16, and 32. The inferior alveolar nerve was visualized at site #17 and #32.  There right maxillary sinus was visualized at site #1 and there was a small 2 x 2 mm perforation.  The wisdom teeth were all difficult to remove due to the patient's age and proximity/position to the inferior alveolar nerve and sinuses.     The patient's throat pack was removed. All counts were correct at the conclusion of the case.  The patient was extubated and taken to the PACU in a stable fashion.   EBL:   minimal  DRAINS: none   LOCAL MEDICATIONS USED:  0.5% MARCAINE with 1:200,000 epinephrine x 4 carpule's and 2.0% LIDOCAINE with 1:100,000 epinephrine   SPECIMEN:  No Specimen  DISPOSITION OF SPECIMEN:  N/A  COUNTS:  YES  DICTATION: .Note written in EPIC  PLAN OF CARE: Discharge to home after PACU  PATIENT DISPOSITION:  PACU - hemodynamically stable.   Delay start of Pharmacological VTE agent (>24hrs) due to surgical blood loss or risk of bleeding:  not applicable

## 2014-10-30 ENCOUNTER — Encounter (HOSPITAL_COMMUNITY): Payer: Self-pay | Admitting: Oral and Maxillofacial Surgery

## 2014-11-25 ENCOUNTER — Encounter (HOSPITAL_BASED_OUTPATIENT_CLINIC_OR_DEPARTMENT_OTHER): Payer: Self-pay | Admitting: *Deleted

## 2014-11-25 ENCOUNTER — Emergency Department (HOSPITAL_BASED_OUTPATIENT_CLINIC_OR_DEPARTMENT_OTHER): Payer: Medicare Other

## 2014-11-25 ENCOUNTER — Emergency Department (HOSPITAL_BASED_OUTPATIENT_CLINIC_OR_DEPARTMENT_OTHER)
Admission: EM | Admit: 2014-11-25 | Discharge: 2014-11-25 | Disposition: A | Discharge status: Home / Self Care | Payer: Medicare Other | Attending: Emergency Medicine | Admitting: Emergency Medicine

## 2014-11-25 DIAGNOSIS — I10 Essential (primary) hypertension: Secondary | ICD-10-CM | POA: Insufficient documentation

## 2014-11-25 DIAGNOSIS — Z8742 Personal history of other diseases of the female genital tract: Secondary | ICD-10-CM | POA: Diagnosis not present

## 2014-11-25 DIAGNOSIS — F419 Anxiety disorder, unspecified: Secondary | ICD-10-CM | POA: Insufficient documentation

## 2014-11-25 DIAGNOSIS — Z8739 Personal history of other diseases of the musculoskeletal system and connective tissue: Secondary | ICD-10-CM | POA: Insufficient documentation

## 2014-11-25 DIAGNOSIS — R911 Solitary pulmonary nodule: Secondary | ICD-10-CM

## 2014-11-25 DIAGNOSIS — Z79899 Other long term (current) drug therapy: Secondary | ICD-10-CM | POA: Insufficient documentation

## 2014-11-25 DIAGNOSIS — G8929 Other chronic pain: Secondary | ICD-10-CM | POA: Insufficient documentation

## 2014-11-25 DIAGNOSIS — G43909 Migraine, unspecified, not intractable, without status migrainosus: Secondary | ICD-10-CM | POA: Insufficient documentation

## 2014-11-25 DIAGNOSIS — L03221 Cellulitis of neck: Secondary | ICD-10-CM | POA: Insufficient documentation

## 2014-11-25 DIAGNOSIS — F4310 Post-traumatic stress disorder, unspecified: Secondary | ICD-10-CM | POA: Diagnosis not present

## 2014-11-25 DIAGNOSIS — Z91040 Latex allergy status: Secondary | ICD-10-CM | POA: Diagnosis not present

## 2014-11-25 DIAGNOSIS — Z8619 Personal history of other infectious and parasitic diseases: Secondary | ICD-10-CM | POA: Diagnosis not present

## 2014-11-25 DIAGNOSIS — K088 Other specified disorders of teeth and supporting structures: Secondary | ICD-10-CM | POA: Diagnosis present

## 2014-11-25 DIAGNOSIS — F319 Bipolar disorder, unspecified: Secondary | ICD-10-CM | POA: Insufficient documentation

## 2014-11-25 DIAGNOSIS — R221 Localized swelling, mass and lump, neck: Secondary | ICD-10-CM

## 2014-11-25 LAB — CBC WITH DIFFERENTIAL/PLATELET
Basophils Absolute: 0 10*3/uL (ref 0.0–0.1)
Basophils Relative: 0 % (ref 0–1)
EOS ABS: 0.2 10*3/uL (ref 0.0–0.7)
EOS PCT: 3 % (ref 0–5)
HEMATOCRIT: 36 % (ref 36.0–46.0)
HEMOGLOBIN: 11.7 g/dL — AB (ref 12.0–15.0)
LYMPHS PCT: 22 % (ref 12–46)
Lymphs Abs: 1.4 10*3/uL (ref 0.7–4.0)
MCH: 28.6 pg (ref 26.0–34.0)
MCHC: 32.5 g/dL (ref 30.0–36.0)
MCV: 88 fL (ref 78.0–100.0)
Monocytes Absolute: 0.5 10*3/uL (ref 0.1–1.0)
Monocytes Relative: 9 % (ref 3–12)
NEUTROS PCT: 66 % (ref 43–77)
Neutro Abs: 4.1 10*3/uL (ref 1.7–7.7)
Platelets: 317 10*3/uL (ref 150–400)
RBC: 4.09 MIL/uL (ref 3.87–5.11)
RDW: 12.8 % (ref 11.5–15.5)
WBC: 6.1 10*3/uL (ref 4.0–10.5)

## 2014-11-25 LAB — COMPREHENSIVE METABOLIC PANEL
ALBUMIN: 3.6 g/dL (ref 3.5–5.2)
ALT: 28 U/L (ref 0–35)
AST: 27 U/L (ref 0–37)
Alkaline Phosphatase: 74 U/L (ref 39–117)
Anion gap: 7 (ref 5–15)
BUN: 14 mg/dL (ref 6–23)
CO2: 26 mmol/L (ref 19–32)
Calcium: 8.7 mg/dL (ref 8.4–10.5)
Chloride: 105 mEq/L (ref 96–112)
Creatinine, Ser: 0.79 mg/dL (ref 0.50–1.10)
GFR calc Af Amer: >90 mL/min (ref >90)
GFR calc non Af Amer: >90 mL/min (ref >90)
GLUCOSE: 109 mg/dL — AB (ref 70–99)
POTASSIUM: 3.5 mmol/L (ref 3.5–5.1)
Sodium: 138 mmol/L (ref 135–145)
Total Bilirubin: 0.2 mg/dL — ABNORMAL LOW (ref 0.3–1.2)
Total Protein: 6.8 g/dL (ref 6.0–8.3)

## 2014-11-25 LAB — I-STAT CG4 LACTIC ACID, ED: LACTIC ACID, VENOUS: 1.52 mmol/L (ref 0.5–2.2)

## 2014-11-25 MED ORDER — HYDROMORPHONE HCL 1 MG/ML IJ SOLN
1.0000 mg | Freq: Once | INTRAMUSCULAR | Status: AC
  Administered 2014-11-25: 1 mg via INTRAVENOUS
  Filled 2014-11-25: qty 1

## 2014-11-25 MED ORDER — MORPHINE SULFATE 4 MG/ML IJ SOLN
4.0000 mg | Freq: Once | INTRAMUSCULAR | Status: AC
  Administered 2014-11-25: 4 mg via INTRAVENOUS
  Filled 2014-11-25: qty 1

## 2014-11-25 MED ORDER — IOHEXOL 300 MG/ML  SOLN
100.0000 mL | Freq: Once | INTRAMUSCULAR | Status: AC | PRN
  Administered 2014-11-25: 100 mL via INTRAVENOUS

## 2014-11-25 MED ORDER — CLINDAMYCIN PHOSPHATE 600 MG/50ML IV SOLN
600.0000 mg | Freq: Once | INTRAVENOUS | Status: AC
  Administered 2014-11-25: 600 mg via INTRAVENOUS
  Filled 2014-11-25: qty 50

## 2014-11-25 MED ORDER — SODIUM CHLORIDE 0.9 % IV BOLUS (SEPSIS)
1000.0000 mL | Freq: Once | INTRAVENOUS | Status: AC
  Administered 2014-11-25: 1000 mL via INTRAVENOUS

## 2014-11-25 MED ORDER — OXYCODONE-ACETAMINOPHEN 5-325 MG PO TABS: 2.0000 | ORAL_TABLET | Freq: Four times a day (QID) | ORAL | Status: DC | PRN

## 2014-11-25 MED ORDER — CLINDAMYCIN HCL 300 MG PO CAPS: ORAL_CAPSULE | ORAL | Status: DC

## 2014-11-25 NOTE — ED Notes (Signed)
Patient having dental bilaterally

## 2014-11-25 NOTE — ED Notes (Signed)
Patient still receiving antibiotics

## 2014-11-25 NOTE — ED Provider Notes (Signed)
CSN: [id number]     Arrival date & time 11/25/14  0813 History   First MD Initiated Contact with Patient 11/25/14 0818     Chief Complaint  Patient presents with  . Dental Pain     (Consider location/radiation/quality/duration/timing/severity/associated sxs/prior Treatment) The history is provided by the patient.  Anna Serrano is a 39 y.o. female hx of WPW, fibroid, chronic back pain here with worsening jaw swelling. She had her wisdom teeth removed a month ago by Dr. Buelah Manis. He has been on amoxicillin, Motrin, hydrocodone. She had some swelling postop that improved. Over the last 3 days she has worsening bilateral jaw swelling. Also has increased pain. Denies any fevers or chills or trouble swallowing.    Past Medical History  Diagnosis Date  . Chronic back pain   . Fibroids   . Genital herpes   . Interstitial cystitis   . Ovarian cyst   . Wolff-Parkinson-White (WPW) syndrome     Had EP study 11/20/2013 at [facility] that ruled out WPW syndrome.   Marland Kitchen Hx of hysterectomy   . Major depressive disorder   . PTSD (post-traumatic stress disorder)   . Arachnoid cyst   . Bipolar 1 disorder   . Hypertension   . Fibromyalgia   . Insomnia   . Depression   . Anxiety   . Migraine     gets "all the time"  . Migraine   . Arthritis    Past Surgical History  Procedure Laterality Date  . Abdominal hysterectomy    . Cardiac catheterization    . Tooth extraction N/A 10/29/2014    Procedure: EXTRACTION OF TEETH #1, #16, #17 & #32;  Surgeon: Isac Caddy, DDS;  Location: [place];  Service: Oral Surgery;  Laterality: N/A;   No family history on file. History  Substance Use Topics  . Smoking status: Never Smoker   . Smokeless tobacco: Never Used  . Alcohol Use: No   OB History    No data available     Review of Systems  HENT:       Jaw swelling   All other systems reviewed and are negative.     Allergies  Latex  Home Medications   Prior to Admission medications    Medication Sig Start Date End Date Taking? Authorizing Provider  buPROPion (WELLBUTRIN XL) 300 MG 24 hr tablet Take 300 mg by mouth daily.    Historical Provider, MD  docusate sodium (COLACE) 100 MG capsule Take 100 mg by mouth 2 (two) times daily.    Historical Provider, MD  lamoTRIgine (LAMICTAL) 200 MG tablet Take 200 mg by mouth daily.    Historical Provider, MD  lovastatin (MEVACOR) 20 MG tablet Take 20 mg by mouth at bedtime.    Historical Provider, MD  polyethylene glycol powder (GLYCOLAX/MIRALAX) powder Take 34 g by mouth daily. 2 scoops    Historical Provider, MD  propranolol (INDERAL) 10 MG tablet Take 10 mg by mouth 2 (two) times daily.     Historical Provider, MD  sertraline (ZOLOFT) 50 MG tablet Take 50 mg by mouth daily.    Historical Provider, MD  SUMAtriptan (IMITREX) 100 MG tablet Take 100 mg by mouth every 2 (two) hours as needed for migraine or headache. May repeat in 2 hours if headache persists or recurs.    Historical Provider, MD  traZODone (DESYREL) 50 MG tablet Take 50 mg by mouth at bedtime.    Historical Provider, MD  valACYclovir (VALTREX) 500 MG tablet Take  500 mg by mouth daily. Cold sores    Historical Provider, MD  zolpidem (AMBIEN) 10 MG tablet Take 15 mg by mouth at bedtime. For sleep    Historical Provider, MD  zonisamide (ZONEGRAN) 25 MG capsule Take 75 mg by mouth at bedtime.     Historical Provider, MD   BP 109/79 mmHg  Pulse 85  Temp(Src) 98.2 F (36.8 C) (Oral)  Resp 16  Ht 5\' 3"  (1.6 m)  Wt 263 lb (119.296 kg)  BMI 46.60 kg/m2  SpO2 100%  LMP  Physical Exam  Constitutional: She is oriented to person, place, and time.  Uncomfortable, overweight   HENT:  Head: Normocephalic.  Surgical scars healing well. No obvious peri apical abscess. OP clear   Eyes: Conjunctivae are normal. Pupils are equal, round, and reactive to light.  Neck:  Bilateral cervical LAD. No stridor   Cardiovascular: Normal rate, regular rhythm and normal heart sounds.    Pulmonary/Chest: Effort normal and breath sounds normal. No respiratory distress. She has no wheezes. She has no rales.  Abdominal: Soft. Bowel sounds are normal. She exhibits no distension. There is no tenderness. There is no rebound.  Musculoskeletal: Normal range of motion. She exhibits no edema or tenderness.  Neurological: She is alert and oriented to person, place, and time. No cranial nerve deficit. Coordination normal.  Skin: Skin is warm and dry.  Psychiatric: She has a normal mood and affect. Her behavior is normal. Judgment and thought content normal.  Nursing note and vitals reviewed.   ED Course  Procedures (including critical care time) Labs Review Labs Reviewed  CBC WITH DIFFERENTIAL - Abnormal; Notable for the following:    Hemoglobin 11.7 (*)    All other components within normal limits  COMPREHENSIVE METABOLIC PANEL - Abnormal; Notable for the following:    Glucose, Bld 109 (*)    Total Bilirubin 0.2 (*)    All other components within normal limits  I-STAT CG4 LACTIC ACID, ED    Imaging Review Ct Soft Tissue Neck W Contrast  11/25/2014   CLINICAL DATA:  Severe bilateral facial and jaw pain/swelling for 3 days. Wisdom teeth removed 12 7/15. Right greater than left. Fever for 3 days ago. Neck swelling.  EXAM: CT NECK WITH CONTRAST  TECHNIQUE: Multidetector CT imaging of the neck was performed using the standard protocol following the bolus administration of intravenous contrast.  CONTRAST:  130mL OMNIPAQUE IOHEXOL 300 MG/ML  SOLN  COMPARISON:  None.  FINDINGS: Limited intracranial imaging is within normal limits. Normal imaged portions of the globes and orbits.  Normal nasopharynx, oral pharynx, hypopharynx, and larynx.  The thyroid gland, submandibular glands, and parotid glands enhance symmetrically.  No evidence of submandibular fluid collection to suggest abscess. There is mild subcutaneous interstitial thickening in the right submandibular space, including on image 37  of series 2.  Multiple small jugular chain and posterior triangle nodes which are likely reactive.  All vascular structures enhance normally.  Limited imaging of the upper chest demonstrates a 4 mm right upper lobe nodule on image 117 of series 2.  Clear mastoid air cells. Mucosal thickening of bilateral maxillary sinuses. Mandibular and maxillary lucencies are likely at the site of wisdom teeth extractions.  IMPRESSION: 1. Lucencies within the bilateral maxilla and mandibles are presumably the site of wisdom teeth extractions. 2. No evidence of adjacent fluid collection to suggest abscess. There is mild interstitial/subcutaneous thickening adjacent the right side of the mandible, likely due to cellulitis. 3. Sinus disease. 4.  4 mm right upper lobe pulmonary nodule. If the patient is at high risk for bronchogenic carcinoma, follow-up chest CT at 1 year is recommended. If the patient is at low risk, no follow-up is needed. This recommendation follows the consensus statement: "Guidelines for Management of Small Pulmonary Nodules Detected on CT Scans: A Statement from the Centre" as published in Radiology 2005; 237:395-400. Available online at: https://www.arnold.com/.   Electronically Signed   By: Abigail Miyamoto M.D.   On: 11/25/2014 09:36     EKG Interpretation None      MDM   Final diagnoses:  Neck swelling    Anna Serrano is a 39 y.o. female here with worsening neck swelling. She is on abx already. I am concerned for partially treated infection or abscess or deep neck infection. Will get labs, lactate, CT neck.   10:10 AM WBC nl, lactate nl. CT showed no abscess, just mild cellulitis. Also has pulmonary nodule that can be followed up outpatient. Will switch abx to clinda since she has more swelling despite being on amoxicillin. Will change pain meds from vicodin to percocet. Will have her f/u with Dr. Buelah Manis this week.    Wandra Arthurs, MD 11/25/14 1011

## 2014-11-25 NOTE — Discharge Instructions (Signed)
Stop taking amoxicillin. Take clindamycin instead for 2 weeks.   Stop taking vicodin. Take percocet instead for pain. Do NOT drive with it.   You need to see Dr. Buelah Manis this week for follow up.   You have lung nodule that is incidental and is not causing your pain. You should have it followed up with your doctor.   Return to ER if you have trouble swallowing, worse pain, vomiting, fevers.

## 2014-11-25 NOTE — ED Notes (Signed)
Patient transported to CT 

## 2016-02-03 IMAGING — CT CT NECK W/ CM
4 of 5 series · 15 of 33 positions shown, 18 images · IV contrast (omnipaque)
Comparison: None.

CLINICAL DATA: Severe bilateral facial and jaw pain/swelling for 3
days. Wisdom teeth removed 12 [DATE]. Right greater than left. Fever
for 3 days ago. Neck swelling.

EXAM:
CT NECK WITH CONTRAST
TECHNIQUE: Multidetector CT imaging of the neck was performed using the
standard protocol following the bolus administration of intravenous
contrast.
CONTRAST:  100mL OMNIPAQUE IOHEXOL 300 MG/ML  SOLN

[Series 2: neck 2.0 b31s · axial · 0.48mm/px · z∈[-299,-113]mm · 5 of 141 slices shown, 7 images]
[im 24/141  soft-tissue]
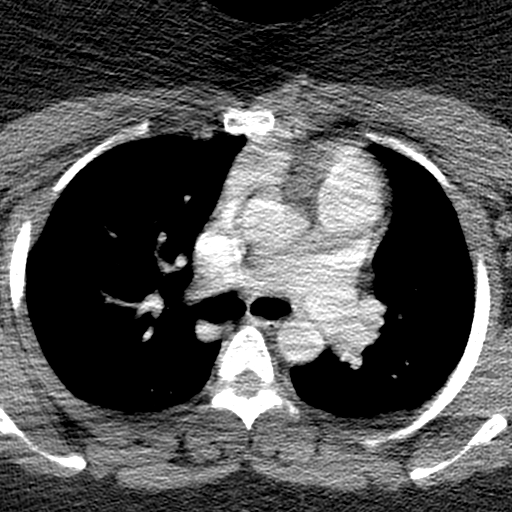
[im 24/141  bone]
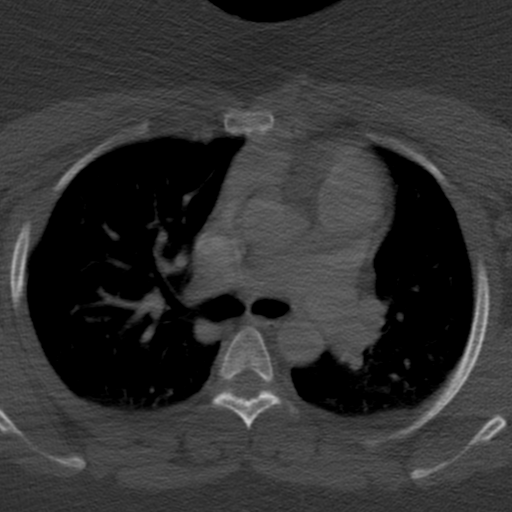
[im 47/141  bone]
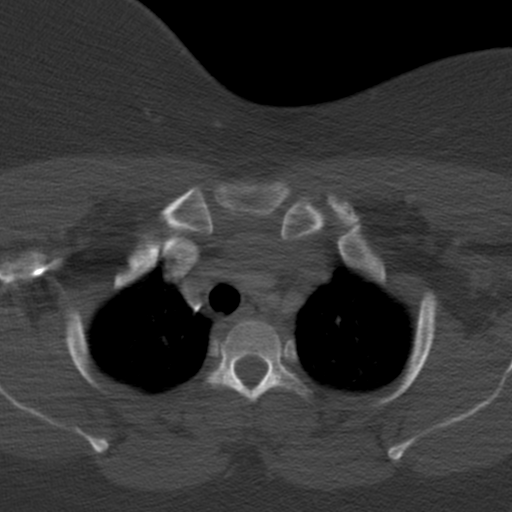
[im 71/141  bone]
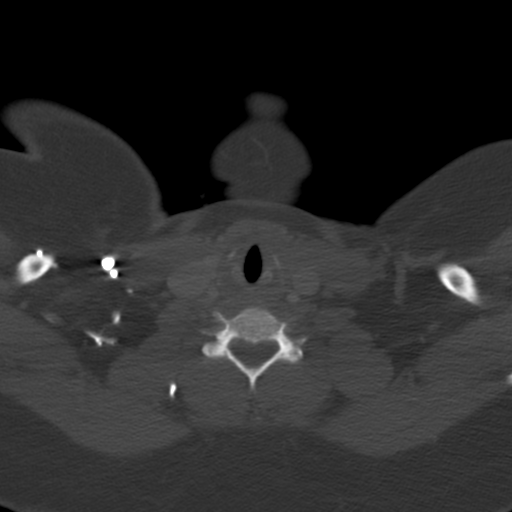
[im 94/141  bone]
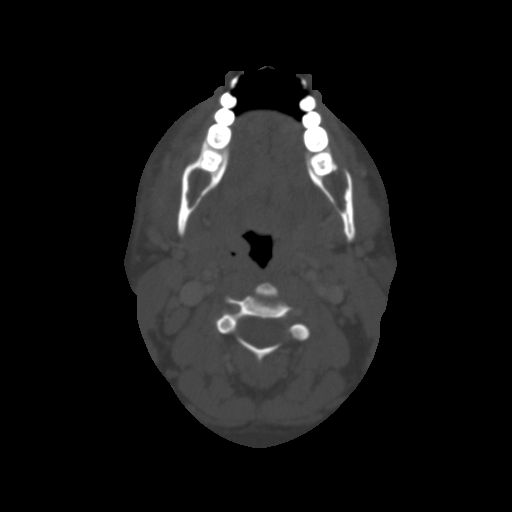
[im 117/141  soft-tissue]
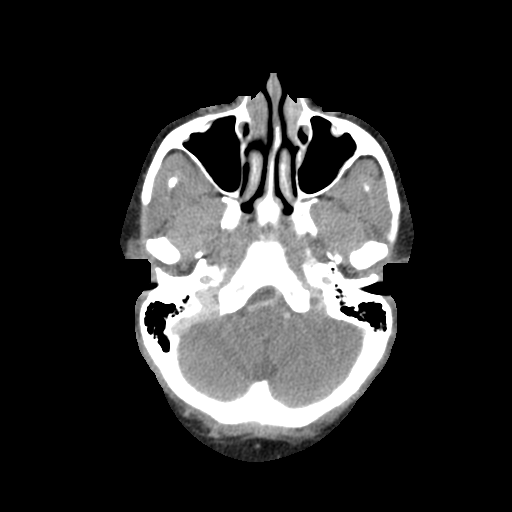
[im 117/141  bone]
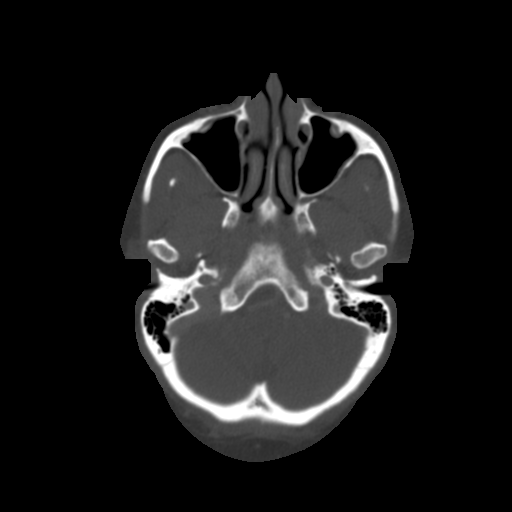

[Series 6: neck 2.0 coronal · coronal · 0.61mm/px · 3 of 91 slices shown]
[im 19/91  bone]
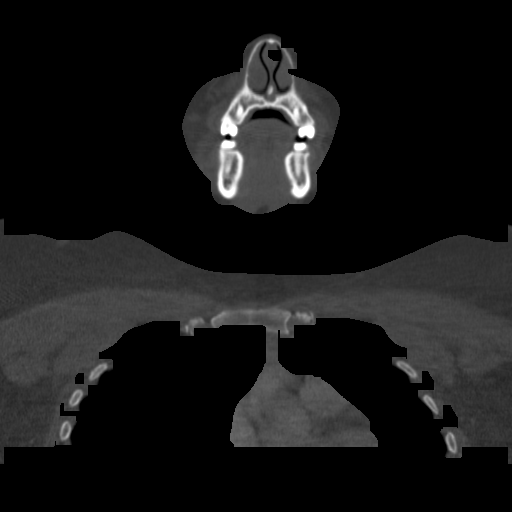
[im 37/91  bone]
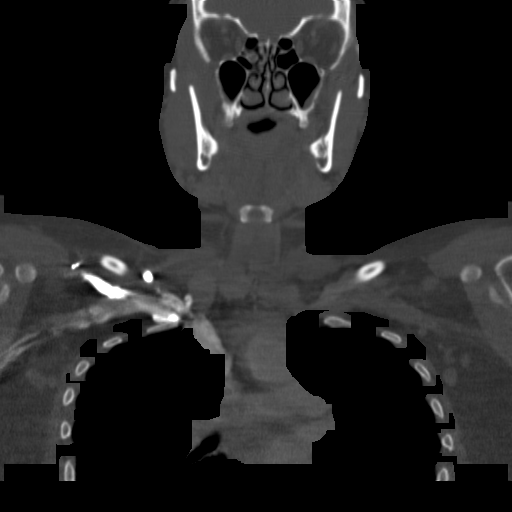
[im 55/91  bone]
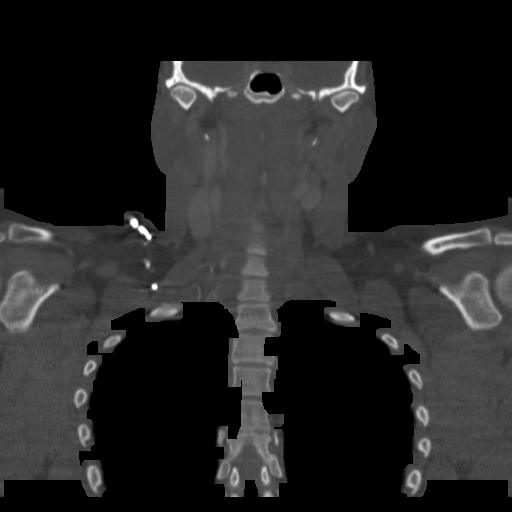

[Series 7: neck 2.0 sagittal · sagittal · 0.61mm/px · 5 of 115 slices shown, 6 images]
[im 39/115  bone]
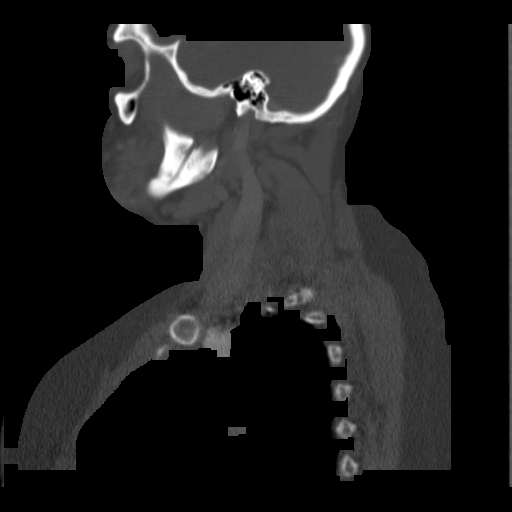
[im 48/115  bone]
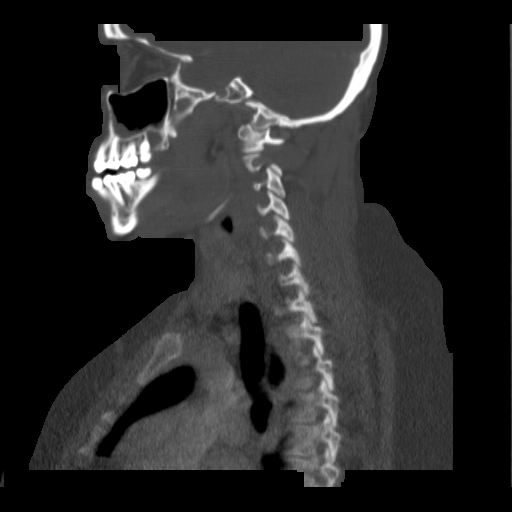
[im 58/115  soft-tissue]
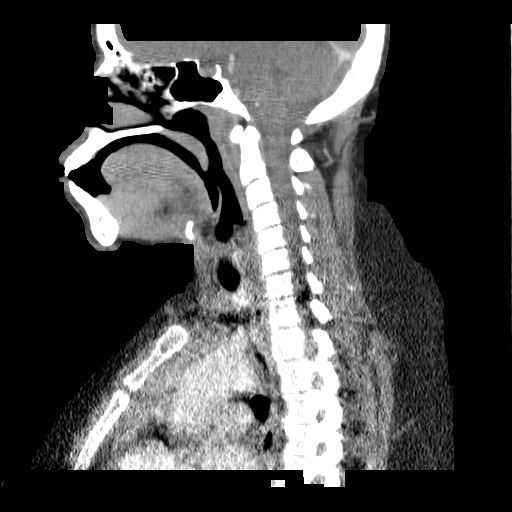
[im 58/115  bone]
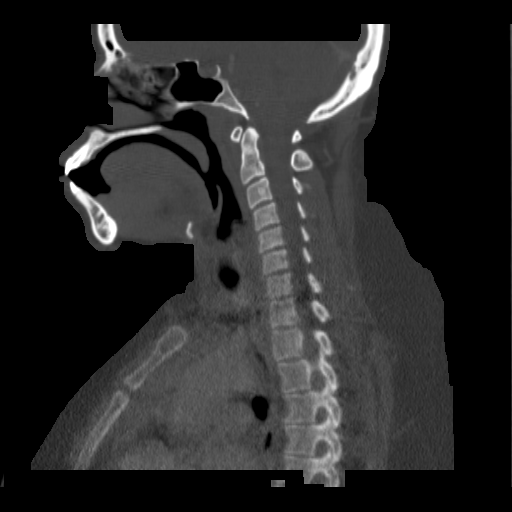
[im 67/115  bone]
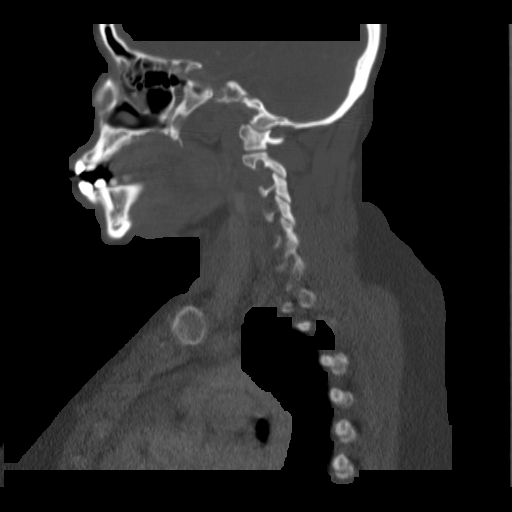
[im 77/115  bone]
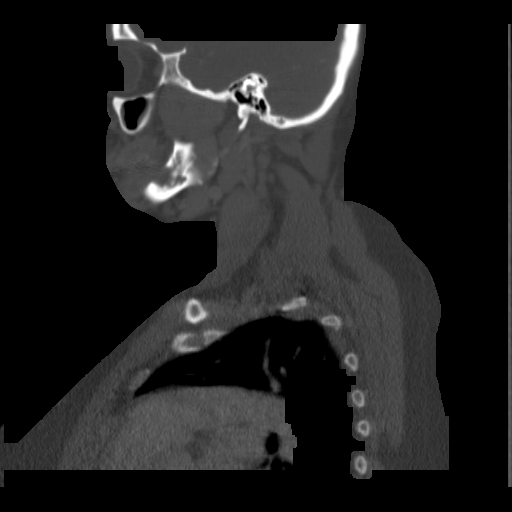

[Series 8: neck 2.0 orth axial to hyoid · axial · 0.39mm/px · z∈[-295,-245]mm · 2 of 128 slices shown]
[im 26/128  bone]
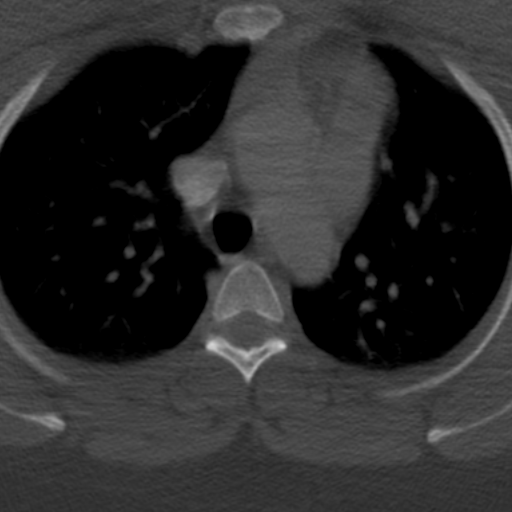
[im 51/128  bone]
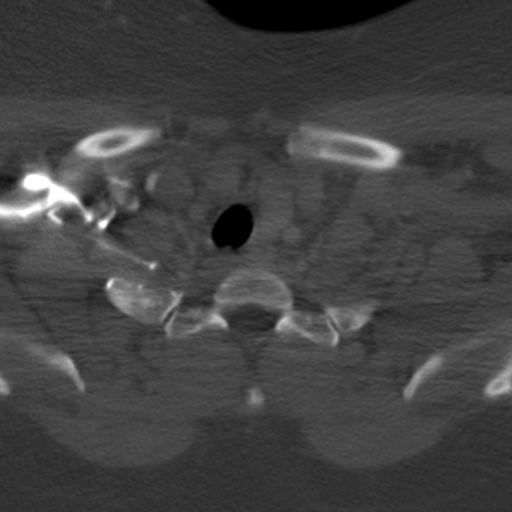

[15 of 33 positions shown; findings below may reference images not displayed]

FINDINGS: Limited intracranial imaging is within normal limits. Normal imaged
portions of the globes and orbits.

Normal nasopharynx, oral pharynx, hypopharynx, and larynx.

The thyroid gland, submandibular glands, and parotid glands enhance
symmetrically.

No evidence of submandibular fluid collection to suggest abscess.
There is mild subcutaneous interstitial thickening in the right
submandibular space, including on image 37 of series 2.

Multiple small jugular chain and posterior triangle nodes which are
likely reactive.

All vascular structures enhance normally.

Limited imaging of the upper chest demonstrates a 4 mm right upper
lobe nodule on image 117 of series 2.

Clear mastoid air cells. Mucosal thickening of bilateral maxillary
sinuses. Mandibular and maxillary lucencies are likely at the site
of wisdom teeth extractions.
IMPRESSION: 1. Lucencies within the bilateral maxilla and mandibles are
presumably the site of wisdom teeth extractions.
2. No evidence of adjacent fluid collection to suggest abscess.
There is mild interstitial/subcutaneous thickening adjacent the
right side of the mandible, likely due to cellulitis.
3. Sinus disease.
4. 4 mm right upper lobe pulmonary nodule. If the patient is at high
risk for bronchogenic carcinoma, follow-up chest CT at 1 year is
recommended. If the patient is at low risk, no follow-up is needed.
This recommendation follows the consensus statement: "Guidelines for
Management of Small Pulmonary Nodules Detected on CT Scans: A
Statement from the [HOSPITAL]" as published in Radiology
2990; [DATE]. Available online at:
[URL]

## 2018-12-10 ENCOUNTER — Other Ambulatory Visit: Payer: Self-pay

## 2018-12-10 ENCOUNTER — Encounter (HOSPITAL_COMMUNITY): Payer: Self-pay

## 2018-12-10 ENCOUNTER — Inpatient Hospital Stay (HOSPITAL_COMMUNITY)
Admission: RE | Admit: 2018-12-10 | Discharge: 2018-12-13 | DRG: 885 | Disposition: A | Discharge status: Home / Self Care | Payer: Medicare Other | Attending: Psychiatry | Admitting: Psychiatry

## 2018-12-10 DIAGNOSIS — Z818 Family history of other mental and behavioral disorders: Secondary | ICD-10-CM

## 2018-12-10 DIAGNOSIS — R45851 Suicidal ideations: Secondary | ICD-10-CM | POA: Diagnosis present

## 2018-12-10 DIAGNOSIS — F419 Anxiety disorder, unspecified: Secondary | ICD-10-CM | POA: Diagnosis present

## 2018-12-10 DIAGNOSIS — F4310 Post-traumatic stress disorder, unspecified: Secondary | ICD-10-CM | POA: Diagnosis present

## 2018-12-10 DIAGNOSIS — M797 Fibromyalgia: Secondary | ICD-10-CM | POA: Diagnosis present

## 2018-12-10 DIAGNOSIS — Z91040 Latex allergy status: Secondary | ICD-10-CM

## 2018-12-10 DIAGNOSIS — Z90710 Acquired absence of both cervix and uterus: Secondary | ICD-10-CM | POA: Diagnosis not present

## 2018-12-10 DIAGNOSIS — I10 Essential (primary) hypertension: Secondary | ICD-10-CM | POA: Diagnosis present

## 2018-12-10 DIAGNOSIS — A6000 Herpesviral infection of urogenital system, unspecified: Secondary | ICD-10-CM | POA: Diagnosis present

## 2018-12-10 DIAGNOSIS — G4700 Insomnia, unspecified: Secondary | ICD-10-CM | POA: Diagnosis present

## 2018-12-10 DIAGNOSIS — F331 Major depressive disorder, recurrent, moderate: Secondary | ICD-10-CM | POA: Diagnosis present

## 2018-12-10 DIAGNOSIS — N3010 Interstitial cystitis (chronic) without hematuria: Secondary | ICD-10-CM | POA: Diagnosis present

## 2018-12-10 DIAGNOSIS — F332 Major depressive disorder, recurrent severe without psychotic features: Principal | ICD-10-CM | POA: Diagnosis present

## 2018-12-10 DIAGNOSIS — Z599 Problem related to housing and economic circumstances, unspecified: Secondary | ICD-10-CM | POA: Diagnosis not present

## 2018-12-10 DIAGNOSIS — Z915 Personal history of self-harm: Secondary | ICD-10-CM

## 2018-12-10 DIAGNOSIS — Z79899 Other long term (current) drug therapy: Secondary | ICD-10-CM

## 2018-12-10 DIAGNOSIS — G8929 Other chronic pain: Secondary | ICD-10-CM | POA: Diagnosis present

## 2018-12-10 MED ORDER — ACETAMINOPHEN 325 MG PO TABS: 650.0000 mg | ORAL_TABLET | Freq: Four times a day (QID) | ORAL | Status: DC | PRN

## 2018-12-10 MED ORDER — MAGNESIUM HYDROXIDE 400 MG/5ML PO SUSP: 30.0000 mL | Freq: Every day | ORAL | Status: DC | PRN

## 2018-12-10 MED ORDER — FLUTICASONE PROPIONATE 50 MCG/ACT NA SUSP
1.0000 | Freq: Two times a day (BID) | NASAL | Status: DC
  Filled 2018-12-10: qty 16

## 2018-12-10 MED ORDER — TEMAZEPAM 15 MG PO CAPS
30.0000 mg | ORAL_CAPSULE | Freq: Once | ORAL | Status: AC
  Administered 2018-12-10: 30 mg via ORAL
  Filled 2018-12-10: qty 2

## 2018-12-10 MED ORDER — TRAZODONE HCL 100 MG PO TABS: 100.0000 mg | ORAL_TABLET | Freq: Every evening | ORAL | Status: DC | PRN

## 2018-12-10 MED ORDER — ALUM & MAG HYDROXIDE-SIMETH 200-200-20 MG/5ML PO SUSP: 30.0000 mL | ORAL | Status: DC | PRN

## 2018-12-10 MED ORDER — HYDROXYZINE HCL 25 MG PO TABS
25.0000 mg | ORAL_TABLET | Freq: Three times a day (TID) | ORAL | Status: DC | PRN
  Administered 2018-12-10: 25 mg via ORAL
  Filled 2018-12-10: qty 1

## 2018-12-10 MED ORDER — SUMATRIPTAN SUCCINATE 50 MG PO TABS
100.0000 mg | ORAL_TABLET | ORAL | Status: DC | PRN
  Administered 2018-12-10: 100 mg via ORAL
  Filled 2018-12-10: qty 2

## 2018-12-10 MED ORDER — SUMATRIPTAN SUCCINATE 50 MG PO TABS: 50.0000 mg | ORAL_TABLET | ORAL | Status: DC | PRN
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
## 2018-12-10 NOTE — Progress Notes (Signed)
D: Pt denies SI/HI/AVH. Pt was concerned about sleeping, due to pt taking  15 mg Ambien . Pt encouraged to talk with doctor tomorrow, NP-Jason gave pt 1x 30 mg Restoril .  A list of Pt medications she came in  on are on her paper chart on belongings sheet. Pt stated the medications on her Home medications list were wrong. Pt was encouraged to get this information verifyed with the doctor tomorrow.  A: Pt was offered support and encouragement. Pt was encourage to attend groups. Q 15 minute checks were done for safety.  R: safety maintained on unit.

## 2018-12-10 NOTE — Tx Team (Signed)
Initial Treatment Plan 12/10/2018 8:58 PM Anna Serrano JGG:[id number]    PATIENT STRESSORS: Financial difficulties   PATIENT STRENGTHS: Ability for insight Active sense of humor Average or above average intelligence Capable of independent living   PATIENT IDENTIFIED PROBLEMS: "working on my depression"  "getting out of this downward spiral"                   DISCHARGE CRITERIA:  Ability to meet basic life and health needs Improved stabilization in mood, thinking, and/or behavior Medical problems require only outpatient monitoring  PRELIMINARY DISCHARGE PLAN: Return to previous living arrangement  PATIENT/FAMILY INVOLVEMENT: This treatment plan has been presented to and reviewed with the patient, Anna Serrano.  The patient and family have been given the opportunity to ask questions and make suggestions.  Baron Sane, RN 12/10/2018, 8:58 PM

## 2018-12-10 NOTE — Progress Notes (Signed)
Psychoeducational Group Note  Date:  12/10/2018 Time:  2215  Group Topic/Focus:  Wrap-Up Group:   The focus of this group is to help patients review their daily goal of treatment and discuss progress on daily workbooks.  Participation Level: Did Not Attend  Participation Quality:  Not Applicable  Affect:  Not Applicable  Cognitive:  Not Applicable  Insight:  Not Applicable  Engagement in Group: Not Applicable  Additional Comments:  The patient did not attend group since she had yet to be admitted to the hallway.   Archie Balboa S 12/10/2018, 10:15 PM
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
## 2018-12-10 NOTE — Progress Notes (Signed)
Patient ID: Anna Serrano, female   DOB: 07/27/1976, 43 y.o.   MRN: [id number] Per State regulations 482.30 this chart was reviewed for medical necessity with respect to the patient's admission/duration of stay.    Next review date: 12/14/2018  Debarah Crape, BSN, RN-BC  Case Manager

## 2018-12-10 NOTE — H&P (Signed)
Behavioral Health Medical Screening Exam  Anna Serrano is an 43 y.o. female. Patient present as walk-in with suicidal ideation  with a plan to overdose on pills due to multiple stressors.  Patient presents with her sister who stated patient called me earlier stating ":that she does not want to live does not want to be in this world any longer."  Reports previous suicide attempt when she was 65.  Reports she is followed by [facility] mental health where she is prescribed Latuda, Xanax and Ativan for her mood patient reports medications is not helping.  Inpatient recommendation.  Total Time spent with patient: 15 minutes  Psychiatric Specialty Exam: Physical Exam  Nursing note and vitals reviewed. Constitutional: She appears well-developed.  Psychiatric: She has a normal mood and affect. Her behavior is normal.    Review of Systems  Psychiatric/Behavioral: Positive for depression and suicidal ideas. The patient is nervous/anxious.   All other systems reviewed and are negative.   There were no vitals taken for this visit.There is no height or weight on file to calculate BMI.  General Appearance: Guarded  Eye Contact:  Fair  Speech:  Clear and Coherent  Volume:  Normal  Mood:  Anxious and Depressed  Affect:  Depressed and Flat  Thought Process:  Coherent  Orientation:  Full (Time, Place, and Person)  Thought Content:  Hallucinations: None  Suicidal Thoughts:  Yes.  with intent/plan  Homicidal Thoughts:  No  Memory:  Immediate;   Fair Recent;   Fair Remote;   Fair  Judgement:  Fair  Insight:  Fair  Psychomotor Activity:  Normal  Concentration: Concentration: Fair  Recall:  [employer] of Knowledge:Good  Language: Fair  Akathisia:  No  Handed:  Right  AIMS (if indicated):     Assets:  Communication Skills Desire for Improvement Social Support Talents/Skills Transportation  Sleep:       Musculoskeletal: Strength & Muscle Tone: within normal limits Gait & Station:  normal Patient leans: N/A  There were no vitals taken for this visit. B/P 111/83 HR 87 O2 sat 100 Temp 980 Recommendations:  Based on my evaluation the patient does not appear to have an emergency medical condition.  Derrill Center, NP 12/10/2018, 6:43 PM

## 2018-12-11 LAB — CBC
HEMATOCRIT: 39 % (ref 36.0–46.0)
Hemoglobin: 12.5 g/dL (ref 12.0–15.0)
MCH: 30.1 pg (ref 26.0–34.0)
MCHC: 32.1 g/dL (ref 30.0–36.0)
MCV: 94 fL (ref 80.0–100.0)
NRBC: 0 % (ref 0.0–0.2)
Platelets: 326 10*3/uL (ref 150–400)
RBC: 4.15 MIL/uL (ref 3.87–5.11)
RDW: 12.6 % (ref 11.5–15.5)
WBC: 6.9 10*3/uL (ref 4.0–10.5)

## 2018-12-11 LAB — HEMOGLOBIN A1C
Hgb A1c MFr Bld: 5.4 % (ref 4.8–5.6)
Mean Plasma Glucose: 108.28 mg/dL

## 2018-12-11 LAB — TSH: TSH: 3.852 u[IU]/mL (ref 0.350–4.500)

## 2018-12-11 LAB — RAPID URINE DRUG SCREEN, HOSP PERFORMED
AMPHETAMINES: NOT DETECTED
BARBITURATES: NOT DETECTED
Benzodiazepines: POSITIVE — AB
Cocaine: NOT DETECTED
Opiates: NOT DETECTED
Tetrahydrocannabinol: NOT DETECTED

## 2018-12-11 LAB — COMPREHENSIVE METABOLIC PANEL
ALT: 14 U/L (ref 0–44)
AST: 16 U/L (ref 15–41)
Albumin: 4 g/dL (ref 3.5–5.0)
Alkaline Phosphatase: 53 U/L (ref 38–126)
Anion gap: 9 (ref 5–15)
BUN: 11 mg/dL (ref 6–20)
CHLORIDE: 108 mmol/L (ref 98–111)
CO2: 20 mmol/L — ABNORMAL LOW (ref 22–32)
Calcium: 8.8 mg/dL — ABNORMAL LOW (ref 8.9–10.3)
Creatinine, Ser: 1.09 mg/dL — ABNORMAL HIGH (ref 0.44–1.00)
GFR calc Af Amer: >60 mL/min (ref >60)
GLUCOSE: 95 mg/dL (ref 70–99)
Potassium: 3.2 mmol/L — ABNORMAL LOW (ref 3.5–5.1)
Sodium: 137 mmol/L (ref 135–145)
TOTAL PROTEIN: 6.8 g/dL (ref 6.5–8.1)
Total Bilirubin: 0.5 mg/dL (ref 0.3–1.2)

## 2018-12-11 LAB — LIPID PANEL
Cholesterol: 161 mg/dL (ref 0–200)
HDL: 56 mg/dL (ref >40)
LDL Cholesterol: 87 mg/dL (ref 0–99)
TRIGLYCERIDES: 92 mg/dL (ref <150)
Total CHOL/HDL Ratio: 2.9 RATIO
VLDL: 18 mg/dL (ref 0–40)

## 2018-12-11 LAB — PREGNANCY, URINE: Preg Test, Ur: NEGATIVE

## 2018-12-11 MED ORDER — VALACYCLOVIR HCL 500 MG PO TABS
1000.0000 mg | ORAL_TABLET | Freq: Every day | ORAL | Status: DC
  Administered 2018-12-11 – 2018-12-13 (×3): 1000 mg via ORAL
  Filled 2018-12-11 (×5): qty 2

## 2018-12-11 MED ORDER — ALPRAZOLAM 1 MG PO TABS
1.0000 mg | ORAL_TABLET | Freq: Three times a day (TID) | ORAL | Status: DC
  Administered 2018-12-11 – 2018-12-12 (×2): 1 mg via ORAL
  Filled 2018-12-11 (×3): qty 1

## 2018-12-11 MED ORDER — DOXEPIN HCL 10 MG PO CAPS
10.0000 mg | ORAL_CAPSULE | Freq: Every day | ORAL | Status: DC
  Administered 2018-12-11: 10 mg via ORAL
  Filled 2018-12-11 (×2): qty 1

## 2018-12-11 MED ORDER — LURASIDONE HCL 80 MG PO TABS
80.0000 mg | ORAL_TABLET | Freq: Every day | ORAL | Status: DC
  Administered 2018-12-11: 80 mg via ORAL
  Filled 2018-12-11 (×2): qty 1

## 2018-12-11 MED ORDER — ZOLPIDEM TARTRATE 5 MG PO TABS
5.0000 mg | ORAL_TABLET | Freq: Every evening | ORAL | Status: DC | PRN
  Administered 2018-12-11: 5 mg via ORAL
  Filled 2018-12-11: qty 1

## 2018-12-11 NOTE — Progress Notes (Signed)
Adult Psychoeducational Group Note  Date:  12/11/2018 Time:  9:19 PM  Group Topic/Focus:  Wrap-Up Group:   The focus of this group is to help patients review their daily goal of treatment and discuss progress on daily workbooks.  Participation Level:  Active  Participation Quality:  Appropriate  Affect:  Appropriate  Cognitive:  Appropriate  Insight: Appropriate  Engagement in Group:  Engaged  Modes of Intervention:  Discussion  Additional Comments:  Patient attended group and participated in the group activity.   Breelle Hollywood W Shan Padgett 4/70/9628, 9:19 PM

## 2018-12-11 NOTE — BHH Group Notes (Signed)
[facility] Group Notes:  (Nursing/MHT/Case Management/Adjunct)  Date:  12/11/2018  Time:  9:00 am  Type of Therapy:  Nurse Education  Participation Level:  Active  Participation Quality:  Appropriate  Affect:  Appropriate  Cognitive:  Appropriate  Insight:  Appropriate  Engagement in Group:  Engaged  Modes of Intervention:  Education  Summary of Progress/Problems:  Patient was alert, oriented and participated in group this morning.    Cammy Copa 12/11/2018, 9:26 AM

## 2018-12-11 NOTE — Progress Notes (Signed)
Pt stated she did not sleep, she was laying in there with her eyes closed

## 2018-12-11 NOTE — Progress Notes (Signed)
D. Pt presents with an anxious affect, pleasant mood- friendly upon approach- calm cooperative behavior- visible in the dayroom interacting well with peers and attending activities. Per pt's self inventory, pt rates her depression, hopelessness and anxiety a 5/4/4, respectively. Pt currently denies SI/HI and AVH . Pt writes that her most important goal today is "thinking more positive" and writes that she will "be more mindful" to help her meet her goal.  A. Labs and vitals monitored. Pt compliant with medications. Pt supported emotionally and encouraged to express concerns and ask questions.   R. Pt remains safe with 15 minute checks. Will continue POC.

## 2018-12-11 NOTE — BHH Group Notes (Signed)
[facility] LCSW Group Therapy Note  Date/Time:    12/11/2018 10:00-11:00AM  Type of Therapy and Topic:  Group Therapy:  Practicing Self-Kindness  Participation Level:  Active   Description of Group:  The focus of this group is to examine human tendencies to be hyper critical of self and how this leads to feelings of worthlessness, hopelessness, and shame.  Patients were guided to the concept that shame is universal and worsened by being kept hidden but improved by being revealed.  We discussed how not feeling worthy is the result of shame and discussed the differences between guilt and shame.  Part of a song "Worth It" was played to encourage people to think differently about worth.  It was shared why it is important to build the ability to tolerate discomfort, since numbing emotions unfortunately applies to both painful and positive feelings and is not selective.  Gratitude was linked to joy and a variety of coping methods were suggested.  Multiple exercises were led to experience a shortened version of several of the skills described.  A song was played at the end of group entitled "I am enough."  Therapeutic Goals 1. Identify statements patients automatically say to themselves, "I'll be worthy when...." and examine how this is not kind to self because it indicates we cannot be worthy until some far-reaching goal is achieved. 2. Share current healthy and unhealthy coping skills used. 3. Practice multiple coping skills including a. Questioning whether they feel guilty ("I did something bad") or shame ("I am bad") and whether this feeling is based in fact. b. 5 senses mindfulness c. Camera - Zoom in and Out d. Grounding  e. AEIOUY (Abstinence, Exercise, I, Others, Unexpressed, Yeah) f. TGIF (Trust, Gratitude, Inspiration, Harmony) 4. Learn about research linking gratitude to joy, and the difference between happiness and joy. 5. Encourage to do the needed work to Estate manager/land agent, focusing on how  medication is part of the solution to emotional and mental problems, while coping skills are necessary to actually change behavior.  Summary of Patient Progress: During group, patient expressed that a current healthy coping skill used is watching movies while a current unhealthy coping skill used is eating cake to sooth herself.  Patient fully participated in the discussion and practice of each exercise.   Therapeutic Modalities Activity Processing Lecture   Selmer Dominion, LCSW 12/11/2018, 2:00 PM

## 2018-12-11 NOTE — BHH Suicide Risk Assessment (Signed)
[facility] Admission Suicide Risk Assessment   Nursing information obtained from:  Patient Demographic factors:  Low socioeconomic status, Unemployed, Divorced or widowed Current Mental Status:  Suicidal ideation indicated by patient, Self-harm thoughts, Self-harm behaviors Loss Factors:  Financial problems / change in socioeconomic status Historical Factors:  Impulsivity, Family history of mental illness or substance abuse Risk Reduction Factors:  Positive social support, Responsible for children under 33 years of age, Positive therapeutic relationship, Sense of responsibility to family, Living with another person, especially a relative  Total Time spent with patient: 45 minutes Principal Problem: MDD (major depressive disorder), recurrent severe, without psychosis ([place]) Diagnosis:  Principal Problem:   MDD (major depressive disorder), recurrent severe, without psychosis ([place])  Subjective Data: Patient is seen and examined.  Patient is a 43 year old female with a reported past psychiatric history significant for posttraumatic stress disorder and bipolar disorder who presented as a walk-in patient to the behavioral health hospital for evaluation.  The patient stated that time she was depressed and suicidal and was considering overdosing.  She told the evaluation folks that she had had a previous suicide attempt in the seventh grade.  She stated that she had been under increased stress recently.  She used $2000 to repair a car, was about to lose her Social Security because her daughter was turning 29, and would have to find new living circumstances because she would be unable to pay her rent after not getting the Social Security check for her daughters age.  She has a history of some memory issues, and is a poor historian also with regard to her medications.  She basically repeated several times "I just take the medicines".  The only 2 medicines that she is acutely aware of are Ambien, Xanax and Valtrex.  She  is followed at [facility] by Dr. love at the [facility] behavioral health program.  Review of the electronic medical record showed that the only 2 medications prescribed by the psychiatrist were the [place] and Xanax.  She is prescribed Ambien 15 mg p.o. nightly per pulmonary sleep medicine.  Review of the electronic medical record more extensively showed that she had been previously treated for depression with Celexa, Prozac, Luvox, Effexor.  These medications either led to side effects or were ineffective.  She also had been previously treated with Seroquel.  She has had an extensive memory work-up.  Initially she was unable to remember what the results were, but she stated that she did not have Alzheimer's disease, but did not have a normal memory.  Some of the recommendations from other physicians were to wean off the Ambien.  She is very resistant to this.  She denies current suicidal ideation, auditory or visual hallucinations.  She was admitted to the hospital for evaluation and stabilization.  Continued Clinical Symptoms:  Alcohol Use Disorder Identification Test Final Score (AUDIT): 0 The "Alcohol Use Disorders Identification Test", Guidelines for Use in Primary Care, Second Edition.  World Pharmacologist [facility]). Score between 0-7:  no or low risk or alcohol related problems. Score between 8-15:  moderate risk of alcohol related problems. Score between 16-19:  high risk of alcohol related problems. Score 20 or above:  warrants further diagnostic evaluation for alcohol dependence and treatment.   CLINICAL FACTORS:   Bipolar Disorder:   Depressive phase Depression:   Anhedonia Hopelessness Impulsivity Insomnia More than one psychiatric diagnosis Previous Psychiatric Diagnoses and Treatments Medical Diagnoses and Treatments/Surgeries   Musculoskeletal: Strength & Muscle Tone: within normal  limits Gait & Station: normal Patient leans: N/A  Psychiatric Specialty  Exam: Physical Exam  Nursing note and vitals reviewed. Constitutional: She is oriented to person, place, and time. She appears well-developed and well-nourished.  HENT:  Head: Normocephalic and atraumatic.  Respiratory: Effort normal.  Neurological: She is alert and oriented to person, place, and time.    ROS  Blood pressure (!) 89/70, pulse (!) 115, temperature 99.4 F (37.4 C), temperature source Oral, resp. rate 18, height 5\' 3"  (1.6 m), weight 106.6 kg, SpO2 100 %.Body mass index is 41.63 kg/m.  General Appearance: Casual  Eye Contact:  Fair  Speech:  Normal Rate  Volume:  Normal  Mood:  Anxious  Affect:  Congruent  Thought Process:  Coherent and Descriptions of Associations: Intact  Orientation:  Full (Time, Place, and Person)  Thought Content:  Logical  Suicidal Thoughts:  No  Homicidal Thoughts:  No  Memory:  Immediate;   Poor Recent;   Poor Remote;   Poor  Judgement:  Intact  Insight:  Fair  Psychomotor Activity:  Increased  Concentration:  Concentration: Fair and Attention Span: Fair  Recall:  Poor  Fund of Knowledge:  Fair  Language:  Fair  Akathisia:  Negative  Handed:  Right  AIMS (if indicated):     Assets:  Desire for Improvement Housing Physical Health Resilience  ADL's:  Intact  Cognition:  WNL  Sleep:  Number of Hours: 6.25      COGNITIVE FEATURES THAT CONTRIBUTE TO RISK:  Thought constriction (tunnel vision)    SUICIDE RISK:   Minimal: No identifiable suicidal ideation.  Patients presenting with no risk factors but with morbid ruminations; may be classified as minimal risk based on the severity of the depressive symptoms  PLAN OF CARE: Patient is seen and examined.  Patient is a 43 year old female with the above-stated past psychiatric history who was admitted secondary to suicidal ideation.  She has a reported history of PTSD and bipolar disorder.  It appears as though it has been attempted to treat her for depression, but she has not tolerated  these medications.  She is totally fixated on the Ambien, and I will continue that but at 10 mg p.o. nightly.  We will continue her Latuda.  This will be 80 mg p.o. every afternoon meal.  We will contact her 43 year old son to get collateral information.  She stated that she would never kill her self now after seeing her children cry when she had told them she was suicidal in the first place.  There seems to be some other things involved, but I am unclear on what those are.  She had an MRI done recently that was essentially normal with just some mild abnormalities.  She apparently has had neuropsych testing, and that showed she did not have Alzheimer's disease, but she stated that she does have some cognitive disorder.  She had been on Topamax at one point, and given the memory problems it would probably be a better idea to be off that, but she was also on it for migraine headaches.  I certify that inpatient services furnished can reasonably be expected to improve the patient's condition.   Sharma Covert, MD 12/11/2018, 9:38 AM
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
## 2018-12-12 DIAGNOSIS — F332 Major depressive disorder, recurrent severe without psychotic features: Principal | ICD-10-CM

## 2018-12-12 DIAGNOSIS — F419 Anxiety disorder, unspecified: Secondary | ICD-10-CM

## 2018-12-12 DIAGNOSIS — G4700 Insomnia, unspecified: Secondary | ICD-10-CM

## 2018-12-12 MED ORDER — LURASIDONE HCL 60 MG PO TABS
60.0000 mg | ORAL_TABLET | Freq: Every day | ORAL | Status: DC
  Administered 2018-12-13: 60 mg via ORAL
  Filled 2018-12-12 (×3): qty 1

## 2018-12-12 MED ORDER — ZOLPIDEM TARTRATE 5 MG PO TABS
5.0000 mg | ORAL_TABLET | Freq: Every evening | ORAL | Status: DC | PRN
  Administered 2018-12-12: 5 mg via ORAL
  Filled 2018-12-12: qty 1

## 2018-12-12 MED ORDER — MIRTAZAPINE 7.5 MG PO TABS
7.5000 mg | ORAL_TABLET | Freq: Every day | ORAL | Status: DC
  Administered 2018-12-12: 7.5 mg via ORAL
  Filled 2018-12-12 (×2): qty 1

## 2018-12-12 MED ORDER — LURASIDONE HCL 60 MG PO TABS: 60.0000 mg | ORAL_TABLET | Freq: Every day | ORAL | Status: DC

## 2018-12-12 MED ORDER — ALPRAZOLAM 1 MG PO TABS
1.0000 mg | ORAL_TABLET | Freq: Two times a day (BID) | ORAL | Status: DC
  Administered 2018-12-12 – 2018-12-13 (×2): 1 mg via ORAL
  Filled 2018-12-12 (×2): qty 1

## 2018-12-12 NOTE — Progress Notes (Signed)
Adult Psychoeducational Group Note  Date:  12/12/2018 Time:  9:55 PM  Group Topic/Focus:  Wrap-Up Group:   The focus of this group is to help patients review their daily goal of treatment and discuss progress on daily workbooks.  Participation Level:  Active  Participation Quality:  Appropriate  Affect:  Appropriate  Cognitive:  Appropriate  Insight: Appropriate  Engagement in Group:  Engaged  Modes of Intervention:  Discussion  Additional Comments:  Patient attended group and participated.  Anna Serrano W Yuleimy Kretz 0/45/4098, 9:55 PM

## 2018-12-12 NOTE — Plan of Care (Signed)
Nurse discussed anxiety, depression, coping skills with patient. 

## 2018-12-12 NOTE — Progress Notes (Signed)
DAR Note: Pt asleep in bed at this time after taking her scheduled S[place]an and PRN Ambien 5 mg earlier this shift . Respirations noted and unlabored. Pt was argumentative and preoccupied about her sleep medication (Ambien) "I talked to the doctor today, he did say he will increase the Ambien to 10 mg so I can sleep but he never did it, I will not be able to sleep tonight just like I did not sleep last night, I have been on 15 mg of Ambien for 5 years now and y'all just want to take me off it, I told the doctor nothing works for me but my Ambien. Pt was verbally educated on dosing range in terms of gender and age but to no avail. Provider made aware. No new orders received.  POC continues for safety and mood stability.

## 2018-12-12 NOTE — BHH Group Notes (Signed)
[facility] Group Notes:  (Nursing/MHT/Case Management/Adjunct)  Date:  12/12/2018  Time:  4:00 pm  Type of Therapy:  Psychoeducational Skills  Participation Level:  Active  Participation Quality:  Appropriate  Affect:  Appropriate  Cognitive:  Appropriate  Insight:  Appropriate  Engagement in Group:  Engaged  Modes of Intervention:  Education  Summary of Progress/Problems: Patient was alert and active during group.    Cammy Copa 12/12/2018, 6:45 PM

## 2018-12-12 NOTE — Tx Team (Signed)
Interdisciplinary Treatment and Diagnostic Plan Update  12/12/2018 Time of Session: 9:00am Anna Serrano MRN: [id number]  Principal Diagnosis: MDD (major depressive disorder), recurrent severe, without psychosis ([facility])  Secondary Diagnoses: Principal Problem:   MDD (major depressive disorder), recurrent severe, without psychosis ([place])   Current Medications:  Current Facility-Administered Medications  Medication Dose Route Frequency Provider Last Rate Last Dose  . acetaminophen (TYLENOL) tablet 650 mg  650 mg Oral Q6H PRN Derrill Center, NP      . ALPRAZolam Duanne Moron) tablet 1 mg  1 mg Oral BID Cobos, Myer Peer, MD      . alum & mag hydroxide-simeth (MAALOX/MYLANTA) 200-200-20 MG/5ML suspension 30 mL  30 mL Oral Q4H PRN Derrill Center, NP      . hydrOXYzine (ATARAX/VISTARIL) tablet 25 mg  25 mg Oral TID PRN Derrill Center, NP   25 mg at 12/10/18 2214  . [START ON 12/13/2018] Lurasidone HCl TABS 60 mg  60 mg Oral Q breakfast Cobos, Fernando A, MD      . magnesium hydroxide (MILK OF MAGNESIA) suspension 30 mL  30 mL Oral Daily PRN Derrill Center, NP      . mirtazapine (REMERON) tablet 7.5 mg  7.5 mg Oral QHS Cobos, Fernando A, MD      . SUMAtriptan (IMITREX) tablet 100 mg  100 mg Oral Q2H PRN Lindon Romp A, NP   100 mg at 12/10/18 2307  . valACYclovir (VALTREX) tablet 1,000 mg  1,000 mg Oral Daily Sharma Covert, MD   1,000 mg at 12/12/18 0759  . zolpidem (AMBIEN) tablet 5 mg  5 mg Oral QHS PRN Cobos, Myer Peer, MD       PTA Medications: Medications Prior to Admission  Medication Sig Dispense Refill Last Dose  . ALPRAZolam (XANAX) 1 MG tablet Take one tablet three times daily prn anxiety.     . cyclobenzaprine (FLEXERIL) 10 MG tablet TAKE 1 TABLET BY MOUTH THREE TIMES DAILY AS NEEDED FOR MUSCLE SPASMS     . linaclotide (LINZESS) 145 MCG CAPS capsule      . lovastatin (MEVACOR) 20 MG tablet Take by mouth.     . valACYclovir (VALTREX) 500 MG tablet Take by mouth.     . zolpidem  (AMBIEN) 10 MG tablet Take by mouth.     Marland Kitchen buPROPion (WELLBUTRIN XL) 300 MG 24 hr tablet Take 300 mg by mouth daily.   Past Week at Unknown time  . Cholecalciferol (VITAMIN D) 50 MCG (2000 UT) CAPS Take by mouth.     . docusate sodium (COLACE) 100 MG capsule Take 100 mg by mouth 2 (two) times daily.   10/28/2014 at Unknown time  . lamoTRIgine (LAMICTAL) 200 MG tablet Take 200 mg by mouth daily.   10/28/2014 at Unknown time  . LATUDA 80 MG TABS tablet      . polyethylene glycol powder (GLYCOLAX/MIRALAX) powder Take 34 g by mouth daily. 2 scoops   Past Week at Unknown time  . propranolol (INDERAL) 10 MG tablet Take 10 mg by mouth 2 (two) times daily.    10/28/2014 at Unknown time  . sertraline (ZOLOFT) 50 MG tablet Take 50 mg by mouth daily.   Past Week at Unknown time  . SUMAtriptan (IMITREX) 100 MG tablet Take 100 mg by mouth every 2 (two) hours as needed for migraine or headache. May repeat in 2 hours if headache persists or recurs.   Past Month at Unknown time  . topiramate (TOPAMAX) 25 MG  tablet      . traZODone (DESYREL) 50 MG tablet Take 50 mg by mouth at bedtime.   Past Month at Unknown time  . zonisamide (ZONEGRAN) 25 MG capsule Take 75 mg by mouth at bedtime.    10/28/2014 at Unknown time    Patient Stressors: Financial difficulties  Patient Strengths: Ability for insight Active sense of humor Average or above average intelligence Capable of independent living  Treatment Modalities: Medication Management, Group therapy, Case management,  1 to 1 session with clinician, Psychoeducation, Recreational therapy.   Physician Treatment Plan for Primary Diagnosis: MDD (major depressive disorder), recurrent severe, without psychosis ([place]) Long Term Goal(s): Improvement in symptoms so as ready for discharge Improvement in symptoms so as ready for discharge   Short Term Goals: Ability to identify changes in lifestyle to reduce recurrence of condition will improve Ability to demonstrate  self-control will improve Ability to maintain clinical measurements within normal limits will improve Compliance with prescribed medications will improve Ability to identify changes in lifestyle to reduce recurrence of condition will improve Ability to verbalize feelings will improve Ability to disclose and discuss suicidal ideas Ability to demonstrate self-control will improve Ability to identify and develop effective coping behaviors will improve  Medication Management: Evaluate patient's response, side effects, and tolerance of medication regimen.  Therapeutic Interventions: 1 to 1 sessions, Unit Group sessions and Medication administration.  Evaluation of Outcomes: Progressing  Physician Treatment Plan for Secondary Diagnosis: Principal Problem:   MDD (major depressive disorder), recurrent severe, without psychosis ([place])  Long Term Goal(s): Improvement in symptoms so as ready for discharge Improvement in symptoms so as ready for discharge   Short Term Goals: Ability to identify changes in lifestyle to reduce recurrence of condition will improve Ability to demonstrate self-control will improve Ability to maintain clinical measurements within normal limits will improve Compliance with prescribed medications will improve Ability to identify changes in lifestyle to reduce recurrence of condition will improve Ability to verbalize feelings will improve Ability to disclose and discuss suicidal ideas Ability to demonstrate self-control will improve Ability to identify and develop effective coping behaviors will improve     Medication Management: Evaluate patient's response, side effects, and tolerance of medication regimen.  Therapeutic Interventions: 1 to 1 sessions, Unit Group sessions and Medication administration.  Evaluation of Outcomes: Progressing   RN Treatment Plan for Primary Diagnosis: MDD (major depressive disorder), recurrent severe, without psychosis ([place]) Long Term  Goal(s): Knowledge of disease and therapeutic regimen to maintain health will improve  Short Term Goals: Ability to demonstrate self-control, Ability to participate in decision making will improve, Ability to disclose and discuss suicidal ideas and Ability to identify and develop effective coping behaviors will improve  Medication Management: RN will administer medications as ordered by provider, will assess and evaluate patient's response and provide education to patient for prescribed medication. RN will report any adverse and/or side effects to prescribing provider.  Therapeutic Interventions: 1 on 1 counseling sessions, Psychoeducation, Medication administration, Evaluate responses to treatment, Monitor vital signs and CBGs as ordered, Perform/monitor CIWA, COWS, AIMS and Fall Risk screenings as ordered, Perform wound care treatments as ordered.  Evaluation of Outcomes: Progressing   LCSW Treatment Plan for Primary Diagnosis: MDD (major depressive disorder), recurrent severe, without psychosis ([place]) Long Term Goal(s): Safe transition to appropriate next level of care at discharge, Engage patient in therapeutic group addressing interpersonal concerns.  Short Term Goals: Engage patient in aftercare planning with referrals and resources, Increase emotional regulation, Identify triggers associated  with mental health/substance abuse issues and Increase skills for wellness and recovery  Therapeutic Interventions: Assess for all discharge needs, 1 to 1 time with Social worker, Explore available resources and support systems, Assess for adequacy in community support network, Educate family and significant other(s) on suicide prevention, Complete Psychosocial Assessment, Interpersonal group therapy.  Evaluation of Outcomes: Progressing   Progress in Treatment: Attending groups: Yes. Participating in groups: Yes. Taking medication as prescribed: Yes. Toleration medication: Yes. Requesting  adjustment of medications to improve sleep Family/Significant other contact made: Yes, individual(s) contacted:  sister Patient understands diagnosis: Yes. Discussing patient identified problems/goals with staff: Yes. Medical problems stabilized or resolved: Yes. Denies suicidal/homicidal ideation: Yes. Issues/concerns per patient self-inventory: No.  New problem(s) identified: No, Describe:  none  New Short Term/Long Term Goal(s):  medication management for mood stabilization; elimination of SI thoughts; development of comprehensive mental wellness/sobriety plan.  Patient Goals:  "To think more positive."  Discharge Plan or Barriers: Patient expected to discharge home. [place] pamphlet, Mobile Crisis information, and AA/NA information provided to patient for additional community support and resources.   Reason for Continuation of Hospitalization: Anxiety Depression  Estimated Length of Stay: 3-5 days  Attendees: Patient: Anna Serrano 12/12/2018 12:24 PM  Physician: Larene Beach 12/12/2018 12:24 PM  Nursing:  12/12/2018 12:24 PM  RN Care Manager: 12/12/2018 12:24 PM  Social Worker: Stephanie Acre, [place] 12/12/2018 12:24 PM  Recreational Therapist:  12/12/2018 12:24 PM  Other:  12/12/2018 12:24 PM  Other:  12/12/2018 12:24 PM  Other: 12/12/2018 12:24 PM    Scribe for Treatment Team: Joellen Jersey, [place] 12/12/2018 12:24 PM
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
## 2018-12-12 NOTE — Progress Notes (Signed)
D:  Patient's self inventory sheet, patient has poor sleep, sleep medication not helpful.  Good appetite, normal energy level, good concentration.  Rated depression, hopeless and anxiety 2.   Denied withdrawals.  Denied SI.  Denied physical problems.  Denied physical pain.  Goal is staying positive.  Plans to attend meetings.  "I need sleep medicine that works."  Does have discharge plans. A:  Medications administered per MD orders.  Emotional support and encouragement given patient. R:  Patient denied SI and HI, contracts for safety.  Denied A/V hallucinations. Safety maintained with 15 minute checks.

## 2018-12-12 NOTE — BHH Group Notes (Signed)
LCSW Group Therapy Note 12/12/2018 3:07 PM  Type of Therapy and Topic: Group Therapy: Overcoming Obstacles  Participation Level: Active  Description of Group:  In this group patients will be encouraged to explore what they see as obstacles to their own wellness and recovery. They will be guided to discuss their thoughts, feelings, and behaviors related to these obstacles. The group will process together ways to cope with barriers, with attention given to specific choices patients can make. Each patient will be challenged to identify changes they are motivated to make in order to overcome their obstacles. This group will be process-oriented, with patients participating in exploration of their own experiences as well as giving and receiving support and challenge from other group members.  Therapeutic Goals: 1. Patient will identify personal and current obstacles as they relate to admission. 2. Patient will identify barriers that currently interfere with their wellness or overcoming obstacles.  3. Patient will identify feelings, thought process and behaviors related to these barriers. 4. Patient will identify two changes they are willing to make to overcome these obstacles:   Summary of Patient Progress  Anna Serrano was engaged and participated throughout the group session. Anna Serrano reports that her main obstacle is being disabled and not being able to work. Anna Serrano states that she has not worked in 7 years due to her diabilities and now that her daughter is going to college she has become anxious about re-entering into the work force.     Therapeutic Modalities:  Cognitive Behavioral Therapy Solution Focused Therapy Motivational Interviewing Relapse Prevention Therapy   Theresa Duty Clinical Social Worker

## 2018-12-12 NOTE — Progress Notes (Signed)
Nursing Progress Note: 7p-7a D: Pt currently presents with a irritable affect and behavior. Pt states "I guess I just wont sleep while Im here. I need more than 5 mg of ambien and a little remeron." Interacting appropriately with the milieu. Pt reports good sleep during the previous night with current medication regimen. Pt did attend wrap-up group.  A: Pt provided with medications per providers orders. Pt's labs and vitals were monitored throughout the night. Pt supported emotionally and encouraged to express concerns and questions. Pt educated on medications.  R: Pt's safety ensured with 15 minute and environmental checks. Pt currently denies SI, HI, and AVH. Pt verbally contracts to seek staff if SI,HI, or AVH occurs and to consult with staff before acting on any harmful thoughts. Will continue to monitor.

## 2018-12-12 NOTE — BHH Suicide Risk Assessment (Signed)
[place] INPATIENT:  Family/Significant Other Suicide Prevention Education  Suicide Prevention Education:  Education Completed;Katrina Toy Cookey, sister [phone]) has been identified by the patient as the family member/significant other with whom the patient will be residing, and identified as the person(s) who will aid the patient in the event of a mental health crisis (suicidal ideations/suicide attempt).  With written consent from the patient, the family member/significant other has been provided the following suicide prevention education, prior to the and/or following the discharge of the patient.  The suicide prevention education provided includes the following:  Suicide risk factors  Suicide prevention and interventions  National Suicide Hotline telephone number  [facility] assessment telephone number  [facility] Emergency Assistance [place] and/or Residential Mobile Crisis Unit telephone number  Request made of family/significant other to:  Remove weapons (e.g., guns, rifles, knives), all items previously/currently identified as safety concern.    Remove drugs/medications (over-the-counter, prescriptions, illicit drugs), all items previously/currently identified as a safety concern.  The family member/significant other verbalizes understanding of the suicide prevention education information provided.  The family member/significant other agrees to remove the items of safety concern listed above.   The patient's sister reports that she and their family are concerned about the patient's mental status. She reports that her sister's baseline for the last four years has been "concerning". She states that her sister may be overmedicating, which causes her to be "loopy and out of it" majority of the time. She also shared that the patient has become more forgetful and often repeats herself after she has made a statement. The patient reports that her sister's  suicidal ideation has NOT been an ongoing issue, however she still has concerns regarding the patient's persistent depressive symptoms over the last four years.   Marylee Floras 12/12/2018, 11:16 AM

## 2018-12-12 NOTE — Progress Notes (Signed)
[facility] MD Progress Note  12/12/2018 10:27 AM Anna Serrano  MRN:  [id number] Subjective:  Patient reports some improvement compared to how she felt at admission, denies suicidal ideations or medication side effects at this time. Complains of insomnia, which she attributes to decreased Ambien dose ( reports she had been taking 15-20 mgrs prior to admission) .  Objective : I have met with patient and have discussed case with treatment team. 43 year old female, has been diagnosed with Bipolar Disorder and PTSD in the past. She describes history of anxiety, depression, but at this time does not endorse any clear history of mania or hypomania. Presented for depression, suicidal ideations with thoughts of overdosing . Attributes depression to recently making poor financial choices . Denies any recent increased impulsivity or increased spending . Prior to admission was prescribed Ambien, Xanax ( at 1 mgr TID on most days) , Latuda. Denies side effects. Today presents vaguely anxious, denies suicidal ideations and is future oriented, hoping for discharge soon. Complains of insomnia, which she states is a chronic issue, and which she attributes in part to being on less Ambien than prescribed prior to admission. No disruptive or agitated behaviors on unit , denies suicidal or self injurious ideations, future oriented and hoping to discharge soon.  Principal Problem: MDD (major depressive disorder), recurrent severe, without psychosis ([place]) Diagnosis: Principal Problem:   MDD (major depressive disorder), recurrent severe, without psychosis ([place])  Total Time spent with patient: 20 minutes  Past Psychiatric History:   Past Medical History:  Past Medical History:  Diagnosis Date  . Anxiety   . Arachnoid cyst   . Arthritis   . Bipolar 1 disorder ([place])   . Chronic back pain   . Depression   . Fibroids   . Fibromyalgia   . Genital herpes   . Hx of hysterectomy   . Hypertension   . Insomnia   .  Interstitial cystitis   . Major depressive disorder   . Migraine    gets "all the time"  . Migraine   . Ovarian cyst   . PTSD (post-traumatic stress disorder)   . Wolff-Parkinson-White (WPW) syndrome    Had EP study 11/20/2013 at [facility] that ruled out WPW syndrome.     Past Surgical History:  Procedure Laterality Date  . ABDOMINAL HYSTERECTOMY    . CARDIAC CATHETERIZATION    . TOOTH EXTRACTION N/A 10/29/2014   Procedure: EXTRACTION OF TEETH #1, #16, #17 & #32;  Surgeon: Isac Caddy, DDS;  Location: [facility];  Service: Oral Surgery;  Laterality: N/A;   Family History: History reviewed. No pertinent family history. Family Psychiatric  History:  Social History:  Social History   Substance and Sexual Activity  Alcohol Use No     Social History   Substance and Sexual Activity  Drug Use No    Social History   Socioeconomic History  . Marital status: Single    Spouse name: Not on file  . Number of children: Not on file  . Years of education: Not on file  . Highest education level: Not on file  Occupational History  . Not on file  Social Needs  . Financial resource strain: Not on file  . Food insecurity:    Worry: Not on file    Inability: Not on file  . Transportation needs:    Medical: Not on file    Non-medical: Not on file  Tobacco Use  . Smoking status: Never Smoker  . Smokeless  tobacco: Never Used  Substance and Sexual Activity  . Alcohol use: No  . Drug use: No  . Sexual activity: Yes    Birth control/protection: Surgical  Lifestyle  . Physical activity:    Days per week: Not on file    Minutes per session: Not on file  . Stress: Not on file  Relationships  . Social connections:    Talks on phone: Not on file    Gets together: Not on file    Attends religious service: Not on file    Active member of club or organization: Not on file    Attends meetings of clubs or organizations: Not on file    Relationship status: Not on file  Other Topics  Concern  . Not on file  Social History Narrative  . Not on file   Additional Social History:    Pain Medications: see MAR Prescriptions: see MAR Over the Counter: see MAR History of alcohol / drug use?: Yes Longest period of sobriety (when/how long): patient states that she drinks wine occasionally, no problematic use  Sleep: Fair- as per nursing notes, slept about 5 hours   Appetite:  Good  Current Medications: Current Facility-Administered Medications  Medication Dose Route Frequency Provider Last Rate Last Dose  . acetaminophen (TYLENOL) tablet 650 mg  650 mg Oral Q6H PRN Derrill Center, NP      . ALPRAZolam Duanne Moron) tablet 1 mg  1 mg Oral BID Niles Ess, Myer Peer, MD      . alum & mag hydroxide-simeth (MAALOX/MYLANTA) 200-200-20 MG/5ML suspension 30 mL  30 mL Oral Q4H PRN Derrill Center, NP      . hydrOXYzine (ATARAX/VISTARIL) tablet 25 mg  25 mg Oral TID PRN Derrill Center, NP   25 mg at 12/10/18 2214  . lurasidone (LATUDA) tablet 80 mg  80 mg Oral Q supper Sharma Covert, MD   80 mg at 12/11/18 1711  . magnesium hydroxide (MILK OF MAGNESIA) suspension 30 mL  30 mL Oral Daily PRN Derrill Center, NP      . SUMAtriptan (IMITREX) tablet 100 mg  100 mg Oral Q2H PRN Lindon Romp A, NP   100 mg at 12/10/18 2307  . valACYclovir (VALTREX) tablet 1,000 mg  1,000 mg Oral Daily Sharma Covert, MD   1,000 mg at 12/12/18 0759  . zolpidem (AMBIEN) tablet 5 mg  5 mg Oral QHS PRN Sharma Covert, MD   5 mg at 12/11/18 2247    Lab Results:  Results for orders placed or performed during the hospital encounter of 12/10/18 (from the past 48 hour(s))  Urine rapid drug screen (hosp performed)not at [facility]     Status: Abnormal   Collection Time: 12/10/18  7:40 PM  Result Value Ref Range   Opiates NONE DETECTED NONE DETECTED   Cocaine NONE DETECTED NONE DETECTED   Benzodiazepines POSITIVE (A) NONE DETECTED   Amphetamines NONE DETECTED NONE DETECTED   Tetrahydrocannabinol NONE DETECTED  NONE DETECTED   Barbiturates NONE DETECTED NONE DETECTED    Comment: (NOTE) DRUG SCREEN FOR MEDICAL PURPOSES ONLY.  IF CONFIRMATION IS NEEDED FOR ANY PURPOSE, NOTIFY LAB WITHIN 5 DAYS. LOWEST DETECTABLE LIMITS FOR URINE DRUG SCREEN Drug Class                     Cutoff (ng/mL) Amphetamine and metabolites    1000 Barbiturate and metabolites    200 Benzodiazepine  540 Tricyclics and metabolites     300 Opiates and metabolites        300 Cocaine and metabolites        300 THC                            50 Performed at [facility], [place] [address] [place], [place] [address]   Pregnancy, urine     Status: None   Collection Time: 12/10/18  7:40 PM  Result Value Ref Range   Preg Test, Ur NEGATIVE NEGATIVE    Comment:        THE SENSITIVITY OF THIS METHODOLOGY IS >20 mIU/mL. Performed at [facility], [place] [address], [place], [place] [address]   Hemoglobin A1c     Status: None   Collection Time: 12/11/18  6:34 AM  Result Value Ref Range   Hgb A1c MFr Bld 5.4 4.8 - 5.6 %    Comment: (NOTE) Pre diabetes:          5.7%-6.4% Diabetes:              >6.4% Glycemic control for   <7.0% adults with diabetes    Mean Plasma Glucose 108.28 mg/dL    Comment: Performed at [facility] [address] [place], [place] [address]  CBC     Status: None   Collection Time: 12/11/18  6:34 AM  Result Value Ref Range   WBC 6.9 4.0 - 10.5 K/uL   RBC 4.15 3.87 - 5.11 MIL/uL   Hemoglobin 12.5 12.0 - 15.0 g/dL   HCT 39.0 36.0 - 46.0 %   MCV 94.0 80.0 - 100.0 fL   MCH 30.1 26.0 - 34.0 pg   MCHC 32.1 30.0 - 36.0 g/dL   RDW 12.6 11.5 - 15.5 %   Platelets 326 150 - 400 K/uL   nRBC 0.0 0.0 - 0.2 %    Comment: Performed at [facility], [place] [address] [place], [place] [address]  Comprehensive metabolic panel     Status: Abnormal   Collection Time: 12/11/18  6:34 AM  Result Value Ref Range   Sodium 137 135 - 145 mmol/L    Potassium 3.2 (L) 3.5 - 5.1 mmol/L   Chloride 108 98 - 111 mmol/L   CO2 20 (L) 22 - 32 mmol/L   Glucose, Bld 95 70 - 99 mg/dL   BUN 11 6 - 20 mg/dL   Creatinine, Ser 1.09 (H) 0.44 - 1.00 mg/dL   Calcium 8.8 (L) 8.9 - 10.3 mg/dL   Total Protein 6.8 6.5 - 8.1 g/dL   Albumin 4.0 3.5 - 5.0 g/dL   AST 16 15 - 41 U/L   ALT 14 0 - 44 U/L   Alkaline Phosphatase 53 38 - 126 U/L   Total Bilirubin 0.5 0.3 - 1.2 mg/dL   GFR calc non Af Amer >60 >60 mL/min   GFR calc Af Amer >60 >60 mL/min   Anion gap 9 5 - 15    Comment: Performed at [facility], [place] [address], [place], [place] [address]  TSH     Status: None   Collection Time: 12/11/18  6:34 AM  Result Value Ref Range   TSH 3.852 0.350 - 4.500 uIU/mL    Comment: Performed by a 3rd Generation assay with a functional sensitivity of <=0.01 uIU/mL. Performed at [facility], [place] [address] [place], [place]  [address]   Lipid panel     Status: None   Collection Time: 12/11/18  6:34 AM  Result Value Ref Range   Cholesterol 161 0 - 200 mg/dL   Triglycerides 92 <150 mg/dL   HDL 56 >40 mg/dL   Total CHOL/HDL Ratio 2.9 RATIO   VLDL 18 0 - 40 mg/dL   LDL Cholesterol 87 0 - 99 mg/dL    Comment:        Total Cholesterol/HDL:CHD Risk Coronary Heart Disease Risk Table                     Men   Women  1/2 Average Risk   3.4   3.3  Average Risk       5.0   4.4  2 X Average Risk   9.6   7.1  3 X Average Risk  23.4   11.0        Use the calculated Patient Ratio above and the CHD Risk Table to determine the patient's CHD Risk.        ATP III CLASSIFICATION (LDL):  <100     mg/dL   Optimal  100-129  mg/dL   Near or Above                    Optimal  130-159  mg/dL   Borderline  160-189  mg/dL   High  >190     mg/dL   Very High Performed at [place] [address], [place], [place] [address]     Blood Alcohol level:  No results found for: [facility]  Metabolic Disorder Labs: Lab  Results  Component Value Date   HGBA1C 5.4 12/11/2018   MPG 108.28 12/11/2018   No results found for: PROLACTIN Lab Results  Component Value Date   CHOL 161 12/11/2018   TRIG 92 12/11/2018   HDL 56 12/11/2018   CHOLHDL 2.9 12/11/2018   VLDL 18 12/11/2018   LDLCALC 87 12/11/2018    Physical Findings: AIMS: Facial and Oral Movements Muscles of Facial Expression: None, normal Lips and Perioral Area: None, normal Jaw: None, normal Tongue: None, normal,Extremity Movements Upper (arms, wrists, hands, fingers): None, normal Lower (legs, knees, ankles, toes): None, normal, Trunk Movements Neck, shoulders, hips: None, normal, Overall Severity Severity of abnormal movements (highest score from questions above): None, normal Incapacitation due to abnormal movements: None, normal Patient's awareness of abnormal movements (rate only patient's report): No Awareness, Dental Status Current problems with teeth and/or dentures?: No Does patient usually wear dentures?: No  CIWA:    COWS:     Musculoskeletal: Strength & Muscle Tone: within normal limits- no current tremors or diaphoresis, no psychomotor agitation Gait & Station: normal Patient leans: N/A  Psychiatric Specialty Exam: Physical Exam  ROS no chest pain, no shortness of breath , no vomiting, no rash  Blood pressure 99/87, pulse (!) 133, temperature 97.9 F (36.6 C), temperature source Oral, resp. rate 18, height _0  (1.6 m), weight 106.6 kg, SpO2 100 %.Body mass index is 41.63 kg/m.  General Appearance: Fairly Groomed  Eye Contact:  Good  Speech:  Normal Rate  Volume:  Normal  Mood:  states feeling "OK", presents vaguely anxious , constricted   Affect:  Congruent  Thought Process:  Linear and Descriptions of Associations: Intact  Orientation:  Full (Time, Place, and Person)  Thought Content:  no hallucinations, no delusions   Suicidal Thoughts:  No denies suicidal or self injurious ideations,  denies homicidal or violent  ideations  Homicidal Thoughts:  No  Memory:  recent and remote grossly intact   Judgement:  Fair/ improving   Insight:  improving   Psychomotor Activity:  Normal  Concentration:  Concentration: Good and Attention Span: Good  Recall:  Good  Fund of Knowledge:  Good  Language:  Good  Akathisia:  Negative  Handed:  Right  AIMS (if indicated):     Assets:  Desire for Improvement Resilience  ADL's:  Intact  Cognition:  WNL  Sleep:  Number of Hours: 5.25   Assessment -  43 year old female, has been diagnosed with Bipolar Disorder and PTSD in the past. She describes history of anxiety, depression, but at this time does not endorse any clear history of mania or hypomania. Presented for depression, suicidal ideations with thoughts of overdosing . Attributes depression to recently making poor financial choices . Denies any recent increased impulsivity or increased spending . Patient reports she is feeling better, and denies suicidal ideations. States she realizes she is needed by her children and by her family, and that " I have a lot of reasons to live". At this time denies medication side effects. She focuses on insomnia as a major and long term stressor and states she has tried different sleeping medications in the past and that the only effective medication thus far has been ( high dose) Ambien. States Seroquel, Trazodone have not helped.  States Sinequan she received last night did not work well for her. We reviewed medications and issues regarding Ambien dosage recommendations for females/ potential side effects/drug/drug interactions related to Ambien/Alprazolam ( which she has been taking in combination prior to admission).  Treatment Plan Summary: Daily contact with patient to assess and evaluate symptoms and progress in treatment, Medication management, Plan inpatient treatment and medications as below Encourage group and milieu participation to work on coping skills and symptom  reduction D/C Sinequan as patient does not feel it was helpful for sleep Agrees to initiating BZD taper- decrease Xanax to 1 mgr BID ( for anxiety) Change Latuda to 60 mgrs QAM for mood disorder. Switch to AM as current QHS dosing could contribute to insomnia Vistaril 25 mgrs TID PRN for anxiety Start Remeron 7.5 mgrs QHS for depression, anxiety, insomnia Treatment team working on disposition planning   Jenne Campus, MD 12/12/2018, 10:27 AM

## 2018-12-13 MED ORDER — ZOLPIDEM TARTRATE 5 MG PO TABS: 5.0000 mg | ORAL_TABLET | Freq: Every evening | ORAL | 0 refills | Status: DC | PRN

## 2018-12-13 MED ORDER — ALPRAZOLAM 1 MG PO TABS: 1.0000 mg | ORAL_TABLET | Freq: Two times a day (BID) | ORAL | 0 refills | Status: DC | PRN

## 2018-12-13 MED ORDER — LURASIDONE HCL 60 MG PO TABS: 60.0000 mg | ORAL_TABLET | Freq: Every day | ORAL | 0 refills | Status: DC

## 2018-12-13 MED ORDER — VALACYCLOVIR HCL 1 G PO TABS: 1000.0000 mg | ORAL_TABLET | Freq: Every day | ORAL | 0 refills | Status: AC

## 2018-12-13 NOTE — BHH Group Notes (Signed)
LCSW Group Therapy Note   12/13/2018  2:30 PM   Type of Therapy and Topic:  Group Therapy- Anger: Consequences and Cues   Participation Level: Active     Description of Group:   In this group, patients defined and differentiated anger from other emotions and identified when and how anger becomes a problem. Patient's identified triggering events and their personal anger cues, secondary emotions to anger, and identified healthy coping mechanisms.    Therapeutic Goals: 1. Patients will identify and discuss consequences of anger and outbursts. 2. Patients will identify and discuss triggering events for anger. 3. Patients will explore other emotions that tend to fuel their secondary emotion of anger. 4. Patients will recognize their physical, behavioral, emotional, and cognitive anger cues. 5. Patients will identify coping skills for anger triggering emotions or events.    Summary of Patient Progress:   Patient listened quietly throughout group and contributed to the group discussion toward the end. Patient identified things being out of her control as a trigger for anger. Patient shared her coping skill is listening to music, but her son took her stereo-system.   Therapeutic Modalities:   Cognitive Behavioral Therapy Motivation Interviewing Solution Focused Therapy

## 2018-12-13 NOTE — BHH Suicide Risk Assessment (Signed)
[facility] Discharge Suicide Risk Assessment   Principal Problem: MDD (major depressive disorder), recurrent severe, without psychosis ([place]) Discharge Diagnoses: Principal Problem:   MDD (major depressive disorder), recurrent severe, without psychosis ([place])   Total Time spent with patient: 30 minutes  Musculoskeletal: Strength & Muscle Tone: within normal limits Gait & Station: normal Patient leans: N/A  Psychiatric Specialty Exam: ROS denies chest pain, no shortness of breath, no vomiting, no rash, no fever  Blood pressure 112/78, pulse (!) 114, temperature 97.9 F (36.6 C), temperature source Oral, resp. rate 18, height 5\' 3"  (1.6 m), weight 106.6 kg, SpO2 100 %.Body mass index is 41.63 kg/m.  General Appearance: Improving grooming  Eye Contact::  Good  Speech:  Normal Rate409  Volume:  Normal  Mood:  Reports feeling better, currently minimizes depression  Affect:  Appropriate and Reactive, vaguely anxious  Thought Process:  Goal Directed and Linear  Orientation:  Full (Time, Place, and Person)  Thought Content:  Denies hallucinations, no delusions, does not appear internally preoccupied  Suicidal Thoughts:  No denies suicidal or self-injurious ideations  Homicidal Thoughts:  No no violent or homicidal ideations  Memory:  Patient is fully oriented x3, recall 3 out of 3 immediate and 1 out of 3 at 5 minutes.  Able to identify /name objects without difficulty.  Able to carry out visual-spatial tasks, such as clock face, without difficulties.  Judgement:  Other:  Fair/improving  Insight:  Fair and Improving  Psychomotor Activity:  Normal-no psychomotor restlessness or agitation  Concentration:  Good  Recall:  [employer] of Knowledge:Good  Language: Good  Akathisia:  Negative  Handed:  Right  AIMS (if indicated):     Assets:  Communication Skills Desire for Improvement Resilience  Sleep:  Number of Hours: 4.5  Cognition: WNL  ADL's:  Intact   Mental Status Per Nursing  Assessment::   On Admission:  Suicidal ideation indicated by patient, Self-harm thoughts, Self-harm behaviors  Demographic Factors:  43 year old female, 2 adolescent children  Loss Factors: Financial difficulties, expensive car repairs  Historical Factors: Reports history of depression and a long history of insomnia.  States she has had sleep study in the past, was told she did not have sleep apnea.   Risk Reduction Factors:   Responsible for children under 57 years of age, Living with another person, especially a relative, Positive social support and Positive coping skills or problem solving skills  Continued Clinical Symptoms:  Currently patient is alert, attentive, calm, describes mood is improved, affect is appropriate, reactive, slightly anxious.  No thought disorder.  Not suicidal, not homicidal, no hallucinations, no delusions, not internally preoccupied, future oriented.. She is planning on following up with her outpatient MDs for further medication management.  States she does plan to gradually taper down/off alprazolam under the guidance of her outpatient provider, expresses reluctance to stop or taper Ambien as she states it has been effective for her long-term insomnia.  With her expressed consent I have spoken with her sister via phone.  Sister is in agreement with discharge today and will pick her up later today.  Of note, sister did state that family is hoping that patient will gradually taper off medications as they feel that they have contributed to some cognitive difficulties/memory issues. Patient does not want to continue Remeron, which was started yesterday.  She states she does not feel it was effective.  She has tolerated alprazolam taper well, currently does not present with symptoms of BZD withdrawal-no tremors, no diaphoresis,  no restlessness.  Behavior on unit in good control, no disruptive or agitated behaviors, pleasant on approach.  Cognitive Features That  Contribute To Risk:  No gross cognitive deficits noted upon discharge. Is alert , attentive, and oriented x 3    Suicide Risk:  Mild:  Suicidal ideation of limited frequency, intensity, duration, and specificity.  There are no identifiable plans, no associated intent, mild dysphoria and related symptoms, good self-control (both objective and subjective assessment), few other risk factors, and identifiable protective factors, including available and accessible social support.  Follow-up [place] Follow up on 01/19/2019.   Why:  Medication management appointment with Dr. Erling Cruz is 2/27 at 1:30p. Please bring your current medications and discharge paperwork from this hospitalization.   Contact information: [place] [id number] p: [place] [phone]           Plan Of Care/Follow-up recommendations:  Activity:  As tolerated Diet:  Regular Tests:  NA Other:  See below  Patient is expressing desire for discharge /readiness for discharge today-no current grounds for involuntary commitment. Patient plans to return home. Follow-up as above. Patient has an established PCP, Dr. Doreene Nest She also has an established sleep specialist, Dr. Ranae Plumber We have reviewed medication side effects, she is aware of tolerance/abuse potential associated with both Xanax and Ambien, potential interactions including excessive sedation and possible cognitive difficulties. At this time states she is planning on discussing gradual taper off these medications with her outpatient MDs Patient aware she should not drive if feeling sedated.   Jenne Campus, MD 12/13/2018, 10:04 AM
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
## 2018-12-13 NOTE — Plan of Care (Signed)
Discharge note  Patient verbalizes readiness for discharge. Follow up plan explained, AVS, Transition record and SRA given. Prescriptions and teaching provided. Belongings returned and signed for. Suicide safety plan completed and signed. Patient verbalizes understanding. Patient denies SI/HI and assures this writer she will seek assistance should that change. Patient discharged to lobby where sister and daughter were waiting.  Problem: Spiritual Needs Goal: Ability to function at adequate level Outcome: Adequate for Discharge   Problem: Education: Goal: Knowledge of [facility] Education information/materials will improve Outcome: Adequate for Discharge Goal: Emotional status will improve Outcome: Adequate for Discharge Goal: Mental status will improve Outcome: Adequate for Discharge Goal: Verbalization of understanding the information provided will improve Outcome: Adequate for Discharge   Problem: Activity: Goal: Interest or engagement in activities will improve Outcome: Adequate for Discharge Goal: Sleeping patterns will improve Outcome: Adequate for Discharge   Problem: Coping: Goal: Ability to verbalize frustrations and anger appropriately will improve Outcome: Adequate for Discharge Goal: Ability to demonstrate self-control will improve Outcome: Adequate for Discharge   Problem: Health Behavior/Discharge Planning: Goal: Identification of resources available to assist in meeting health care needs will improve Outcome: Adequate for Discharge Goal: Compliance with treatment plan for underlying cause of condition will improve Outcome: Adequate for Discharge   Problem: Physical Regulation: Goal: Ability to maintain clinical measurements within normal limits will improve Outcome: Adequate for Discharge   Problem: Safety: Goal: Periods of time without injury will increase Outcome: Adequate for Discharge   Problem: Education: Goal: Ability to state activities that  reduce stress will improve Outcome: Adequate for Discharge   Problem: Coping: Goal: Ability to identify and develop effective coping behavior will improve Outcome: Adequate for Discharge   Problem: Self-Concept: Goal: Ability to identify factors that promote anxiety will improve Outcome: Adequate for Discharge Goal: Level of anxiety will decrease Outcome: Adequate for Discharge Goal: Ability to modify response to factors that promote anxiety will improve Outcome: Adequate for Discharge   Problem: Education: Goal: Utilization of techniques to improve thought processes will improve Outcome: Adequate for Discharge Goal: Knowledge of the prescribed therapeutic regimen will improve Outcome: Adequate for Discharge   Problem: Activity: Goal: Interest or engagement in leisure activities will improve Outcome: Adequate for Discharge Goal: Imbalance in normal sleep/wake cycle will improve Outcome: Adequate for Discharge   Problem: Coping: Goal: Coping ability will improve Outcome: Adequate for Discharge Goal: Will verbalize feelings Outcome: Adequate for Discharge   Problem: Health Behavior/Discharge Planning: Goal: Ability to make decisions will improve Outcome: Adequate for Discharge Goal: Compliance with therapeutic regimen will improve Outcome: Adequate for Discharge   Problem: Role Relationship: Goal: Will demonstrate positive changes in social behaviors and relationships Outcome: Adequate for Discharge   Problem: Safety: Goal: Ability to disclose and discuss suicidal ideas will improve Outcome: Adequate for Discharge Goal: Ability to identify and utilize support systems that promote safety will improve Outcome: Adequate for Discharge   Problem: Self-Concept: Goal: Will verbalize positive feelings about self Outcome: Adequate for Discharge Goal: Level of anxiety will decrease Outcome: Adequate for Discharge   Problem: Activity: Goal: Will identify at least one  activity in which they can participate Outcome: Adequate for Discharge   Problem: Coping: Goal: Ability to identify and develop effective coping behavior will improve Outcome: Adequate for Discharge Goal: Ability to interact with others will improve Outcome: Adequate for Discharge Goal: Demonstration of participation in decision-making regarding own care will improve Outcome: Adequate for Discharge Goal: Ability to use eye contact when communicating with  others will improve Outcome: Adequate for Discharge   Problem: Health Behavior/Discharge Planning: Goal: Identification of resources available to assist in meeting health care needs will improve Outcome: Adequate for Discharge   Problem: Self-Concept: Goal: Will verbalize positive feelings about self Outcome: Adequate for Discharge   Problem: Education: Goal: Ability to incorporate positive changes in behavior to improve self-esteem will improve Outcome: Adequate for Discharge   Problem: Health Behavior/Discharge Planning: Goal: Ability to identify and utilize available resources and services will improve Outcome: Adequate for Discharge Goal: Ability to remain free from injury will improve Outcome: Adequate for Discharge   Problem: Self-Concept: Goal: Will verbalize positive feelings about self Outcome: Adequate for Discharge   Problem: Skin Integrity: Goal: Demonstration of wound healing without infection will improve Outcome: Adequate for Discharge   Problem: Education: Goal: Ability to make informed decisions regarding treatment will improve Outcome: Adequate for Discharge   Problem: Coping: Goal: Coping ability will improve Outcome: Adequate for Discharge   Problem: Health Behavior/Discharge Planning: Goal: Identification of resources available to assist in meeting health care needs will improve Outcome: Adequate for Discharge   Problem: Medication: Goal: Compliance with prescribed medication regimen will  improve Outcome: Adequate for Discharge   Problem: Self-Concept: Goal: Ability to disclose and discuss suicidal ideas will improve Outcome: Adequate for Discharge Goal: Will verbalize positive feelings about self Outcome: Adequate for Discharge

## 2018-12-23 ENCOUNTER — Other Ambulatory Visit: Payer: Self-pay | Admitting: Family Medicine

## 2018-12-23 DIAGNOSIS — Z1231 Encounter for screening mammogram for malignant neoplasm of breast: Secondary | ICD-10-CM

## 2019-01-26 ENCOUNTER — Ambulatory Visit: Payer: Self-pay

## 2019-02-22 ENCOUNTER — Ambulatory Visit: Payer: Self-pay

## 2019-04-13 ENCOUNTER — Ambulatory Visit: Payer: Self-pay

## 2019-05-17 ENCOUNTER — Other Ambulatory Visit: Payer: Self-pay

## 2019-05-17 ENCOUNTER — Ambulatory Visit
Admission: RE | Admit: 2019-05-17 | Discharge: 2019-05-17 | Disposition: A | Discharge status: Home / Self Care | Payer: Medicare Other | Source: Ambulatory Visit | Attending: Family Medicine | Admitting: Family Medicine

## 2019-05-17 DIAGNOSIS — Z1231 Encounter for screening mammogram for malignant neoplasm of breast: Secondary | ICD-10-CM

## 2019-05-25 ENCOUNTER — Ambulatory Visit: Payer: Medicare Other

## 2020-06-14 ENCOUNTER — Other Ambulatory Visit: Payer: Self-pay | Admitting: Family Medicine

## 2020-06-14 DIAGNOSIS — Z1231 Encounter for screening mammogram for malignant neoplasm of breast: Secondary | ICD-10-CM

## 2020-07-15 ENCOUNTER — Other Ambulatory Visit: Payer: Self-pay

## 2020-07-15 ENCOUNTER — Ambulatory Visit
Admission: RE | Admit: 2020-07-15 | Discharge: 2020-07-15 | Disposition: A | Discharge status: Home / Self Care | Payer: Medicare Other | Source: Ambulatory Visit | Attending: Family Medicine | Admitting: Family Medicine

## 2020-07-15 DIAGNOSIS — Z1231 Encounter for screening mammogram for malignant neoplasm of breast: Secondary | ICD-10-CM

## 2020-11-27 ENCOUNTER — Emergency Department (HOSPITAL_BASED_OUTPATIENT_CLINIC_OR_DEPARTMENT_OTHER)
Admission: EM | Admit: 2020-11-27 | Discharge: 2020-11-27 | Disposition: A | Discharge status: Home / Self Care | Payer: Medicare (Managed Care) | Attending: Emergency Medicine | Admitting: Emergency Medicine

## 2020-11-27 ENCOUNTER — Other Ambulatory Visit: Payer: Self-pay

## 2020-11-27 ENCOUNTER — Encounter (HOSPITAL_BASED_OUTPATIENT_CLINIC_OR_DEPARTMENT_OTHER): Payer: Self-pay

## 2020-11-27 DIAGNOSIS — Z91040 Latex allergy status: Secondary | ICD-10-CM | POA: Insufficient documentation

## 2020-11-27 DIAGNOSIS — U071 COVID-19: Secondary | ICD-10-CM | POA: Diagnosis not present

## 2020-11-27 DIAGNOSIS — R070 Pain in throat: Secondary | ICD-10-CM | POA: Diagnosis present

## 2020-11-27 DIAGNOSIS — J069 Acute upper respiratory infection, unspecified: Secondary | ICD-10-CM | POA: Diagnosis not present

## 2020-11-27 DIAGNOSIS — Z20822 Contact with and (suspected) exposure to covid-19: Secondary | ICD-10-CM

## 2020-11-27 NOTE — ED Provider Notes (Signed)
[facility] EMERGENCY DEPARTMENT Provider Note   CSN: [id number] Arrival date & time: 11/27/20  1729     History Chief Complaint  Patient presents with  . Cough    Anna Serrano is a 45 y.o. female with past medical history of bipolar 1, anxiety, depression, fibromyalgia, hypertension that presents the emerge department today for flulike symptoms for the past 2 days.  Patient states that she has a sore throat, cough myalgias and a headache for the past 2 days.  Denies any muffled voice, drooling, trouble breathing or trouble swallowing.  Denies any chest pain or shortness of breath.  States that cough is nonproductive.  Patient states that she was with her sister all day for the past week who recently tested positive for Covid.  Patient also states that for people caught her for work yesterday for testing positive for Covid.  States that she had multiple exposures.  States that she was vaccinated against Covid in April.  Has not had her booster.  Denies any fevers or chills.  Denies any neck pain.  Denies any vision changes.  Denies any nausea, vomiting, diarrhea.  Denies any abdominal pain.  Patient states that she is been eating and drinking normally.  Has been urinating normally.  States that she has not been taking anything for this.  Denies any numbness or tingling or weakness.  No other complaints.  States that she is mainly here to get Covid test..  HPI     Past Medical History:  Diagnosis Date  . Anxiety   . Arachnoid cyst   . Arthritis   . Bipolar 1 disorder ([place])   . Chronic back pain   . Depression   . Fibroids   . Fibromyalgia   . Genital herpes   . Hx of hysterectomy   . Hypertension   . Insomnia   . Interstitial cystitis   . Major depressive disorder   . Migraine    gets "all the time"  . Migraine   . Ovarian cyst   . PTSD (post-traumatic stress disorder)   . Wolff-Parkinson-White (WPW) syndrome    Had EP study 11/20/2013 at [facility] that ruled out WPW  syndrome.     Patient Active Problem List   Diagnosis Date Noted  . MDD (major depressive disorder), recurrent severe, without psychosis ([place]) 12/10/2018    Past Surgical History:  Procedure Laterality Date  . ABDOMINAL HYSTERECTOMY    . CARDIAC CATHETERIZATION    . TOOTH EXTRACTION N/A 10/29/2014   Procedure: EXTRACTION OF TEETH #1, #16, #17 & #32;  Surgeon: Isac Caddy, DDS;  Location: [facility];  Service: Oral Surgery;  Laterality: N/A;     OB History   No obstetric history on file.     No family history on file.  Social History   Tobacco Use  . Smoking status: Never Smoker  . Smokeless tobacco: Never Used  Vaping Use  . Vaping Use: Never used  Substance Use Topics  . Alcohol use: Yes    Comment: occ  . Drug use: No    Home Medications Prior to Admission medications   Medication Sig Start Date End Date Taking? Authorizing Provider  linaclotide Rolan Lipa) 290 MCG CAPS capsule 1 cap(s) 08/07/19  Yes [provider]  lovastatin (MEVACOR) 20 MG tablet Take by mouth. 09/01/20  Yes [provider]  lurasidone (LATUDA) 80 MG TABS tablet Take by mouth. 06/10/20  Yes [provider]  rizatriptan (MAXALT) 10 MG tablet Take  by mouth. 02/13/20  Yes [provider]  topiramate (TOPAMAX) 100 MG tablet  04/15/20  Yes [provider]  zolpidem (AMBIEN) 10 MG tablet TAKE 1 AND 1/2 TABLETS BY MOUTH EVERY DAY AT BEDTIME 04/19/19  Yes [provider]  ALPRAZolam (XANAX) 1 MG tablet Take 1 tablet (1 mg total) by mouth 2 (two) times daily as needed for anxiety. 12/13/18   Connye Burkitt, NP  Cholecalciferol (VITAMIN D) 50 MCG (2000 UT) CAPS Take by mouth.    [provider]  cyclobenzaprine (FLEXERIL) 10 MG tablet Take 10 mg by mouth 3 (three) times daily. 06/03/20   [provider]  DULoxetine (CYMBALTA) 30 MG capsule Take 30 mg by mouth daily. 06/10/20   [provider]  lovastatin (MEVACOR) 20 MG tablet Take  by mouth. 01/10/15   [provider]  Lurasidone HCl 60 MG TABS Take 1 tablet (60 mg total) by mouth daily with breakfast. For mood 12/14/18   Connye Burkitt, NP  SUMAtriptan (IMITREX) 100 MG tablet Take 100 mg by mouth every 2 (two) hours as needed for migraine or headache. May repeat in 2 hours if headache persists or recurs.    [provider]  valACYclovir (VALTREX) 1000 MG tablet Take 1 tablet (1,000 mg total) by mouth daily. 12/14/18   Connye Burkitt, NP  zolpidem (AMBIEN) 5 MG tablet Take 1 tablet (5 mg total) by mouth at bedtime as needed for sleep. 12/13/18   Connye Burkitt, NP    Allergies    Latex  Review of Systems   Review of Systems  Constitutional: Negative for diaphoresis, fatigue and fever.  HENT: Positive for sore throat. Negative for congestion, drooling, facial swelling, mouth sores, sinus pressure and sinus pain.   Eyes: Negative for visual disturbance.  Respiratory: Positive for cough. Negative for shortness of breath.   Cardiovascular: Negative for chest pain.  Gastrointestinal: Negative for nausea and vomiting.  Musculoskeletal: Positive for arthralgias and myalgias. Negative for back pain.  Skin: Negative for color change, pallor, rash and wound.  Neurological: Positive for headaches. Negative for syncope, weakness, light-headedness and numbness.  Psychiatric/Behavioral: Negative for behavioral problems and confusion.    Physical Exam Updated Vital Signs BP (!) 127/58 (BP Location: Left Arm)   Pulse (!) 103   Temp 99.2 F (37.3 C) (Oral)   Resp 18   Ht 5\' 3"  (1.6 m)   Wt 107 kg   SpO2 100%   BMI 41.81 kg/m   Physical Exam Constitutional:      General: She is not in acute distress.    Appearance: Normal appearance. She is not ill-appearing, toxic-appearing or diaphoretic.  HENT:     Head: Normocephalic and atraumatic.     Mouth/Throat:     Mouth: Mucous membranes are moist.     Pharynx: Uvula midline. Posterior oropharyngeal erythema  present. No pharyngeal swelling or oropharyngeal exudate.     Tonsils: No tonsillar exudate or tonsillar abscesses. 1+ on the right. 1+ on the left.     Comments: No swelling under tongue.  Handling secretions well.  Speaking me in full sentences.  No trismus. Cardiovascular:     Rate and Rhythm: Normal rate and regular rhythm.     Pulses: Normal pulses.  Pulmonary:     Effort: Pulmonary effort is normal. No respiratory distress.     Breath sounds: Normal breath sounds. No wheezing.  Chest:     Chest wall: No tenderness.  Abdominal:  General: Abdomen is flat. There is no distension.  Musculoskeletal:        General: Normal range of motion.     Cervical back: Normal range of motion. No rigidity.  Lymphadenopathy:     Cervical: No cervical adenopathy.  Skin:    General: Skin is warm and dry.     Capillary Refill: Capillary refill takes less than 2 seconds.  Neurological:     General: No focal deficit present.     Mental Status: She is alert and oriented to person, place, and time.  Psychiatric:        Mood and Affect: Mood normal.        Behavior: Behavior normal.        Thought Content: Thought content normal.     ED Results / Procedures / Treatments   Labs (all labs ordered are listed, but only abnormal results are displayed) Labs Reviewed  SARS CORONAVIRUS 2 (TAT 6-24 HRS)    EKG None  Radiology No results found.  Procedures Procedures (including critical care time)  Medications Ordered in ED Medications - No data to display  ED Course  I have reviewed the triage vital signs and the nursing notes.  Pertinent labs & imaging results that were available during my care of the patient were reviewed by me and considered in my medical decision making (see chart for details).    MDM Rules/Calculators/A&P                         Anna Serrano is a 45 y.o. female with past medical history of bipolar 1, anxiety, depression, fibromyalgia, hypertension that presents  the emerge department today for flulike symptoms for the past 2 days.  Appears well, benign physical exam, pharynx is slightly erythematous, no concerns for deep space infection, peritonsillar abscess, Ludwigs.  Low suspicion for strep throat, patient has cough.  Centor score 0.  I think patient most likely has Covid with her multiple exposures and her symptoms feel any likelihood.  Will obtain 6 to 24-hour Covid test and have patient sent home.  Patient satting 100% on room air, no concerns for respiratory compromise.  Patient to be discharged at this time, she will follow up with PCP.   Doubt need for further emergent work up at this time. I explained the diagnosis and have given explicit precautions to return to the ER including for any other new or worsening symptoms. The patient understands and accepts the medical plan as it's been dictated and I have answered their questions. Discharge instructions concerning home care and prescriptions have been given. The patient is STABLE and is discharged to home in good condition.   Final Clinical Impression(s) / ED Diagnoses Final diagnoses:  Viral upper respiratory tract infection  Encounter for laboratory testing for COVID-19 virus    Rx / DC Orders ED Discharge Orders    None       Farrel Gordon, PA-C 11/27/20 2041    Tilden Fossa, MD 11/27/20 2253

## 2020-11-27 NOTE — ED Triage Notes (Signed)
Pt c/o flu like sx day 2 with +covid exposure-NAD-steady gait

## 2020-11-27 NOTE — Discharge Instructions (Addendum)
You most likely has Covid as we discussed, you will obtain your Covid result on MyChart in the next 6 to 24 hours.  Keep an eye on this.  If you have any new or worsening concerning symptoms such as shortness of breath, difficulty breathing, muffled voice, trouble handling your spit or difficulty swallowing, drooling please come back to the emergency department.  Take Tylenol as directed on the bottle for pain, you can also gargle warm water with salt to help with your sore throat.  Please use the attached instructions.  If you do have Covid please follow CDC guidelines and isolate for 5 days, if you continue to be asymptomatic then you can go back to work however if you are still symptomatic when you have to isolate for the next 5 days after.  Please follow-up with your primary care in the next couple of days.

## 2020-11-28 LAB — SARS CORONAVIRUS 2 (TAT 6-24 HRS): SARS Coronavirus 2: POSITIVE — AB

## 2021-07-10 ENCOUNTER — Other Ambulatory Visit: Payer: Self-pay | Admitting: Family Medicine

## 2021-07-10 DIAGNOSIS — Z1231 Encounter for screening mammogram for malignant neoplasm of breast: Secondary | ICD-10-CM

## 2021-07-31 ENCOUNTER — Ambulatory Visit
Admission: RE | Admit: 2021-07-31 | Discharge: 2021-07-31 | Disposition: A | Discharge status: Home / Self Care | Payer: Medicare Other | Source: Ambulatory Visit | Attending: Family Medicine | Admitting: Family Medicine

## 2021-07-31 ENCOUNTER — Other Ambulatory Visit: Payer: Self-pay

## 2021-07-31 DIAGNOSIS — Z1231 Encounter for screening mammogram for malignant neoplasm of breast: Secondary | ICD-10-CM

## 2021-10-23 ENCOUNTER — Other Ambulatory Visit: Payer: Self-pay

## 2021-10-23 ENCOUNTER — Emergency Department (HOSPITAL_BASED_OUTPATIENT_CLINIC_OR_DEPARTMENT_OTHER)
Admission: EM | Admit: 2021-10-23 | Discharge: 2021-10-23 | Disposition: A | Discharge status: Home / Self Care | Payer: Medicare Other | Attending: Emergency Medicine | Admitting: Emergency Medicine

## 2021-10-23 ENCOUNTER — Encounter (HOSPITAL_BASED_OUTPATIENT_CLINIC_OR_DEPARTMENT_OTHER): Payer: Self-pay | Admitting: Emergency Medicine

## 2021-10-23 DIAGNOSIS — I10 Essential (primary) hypertension: Secondary | ICD-10-CM | POA: Insufficient documentation

## 2021-10-23 DIAGNOSIS — Z79899 Other long term (current) drug therapy: Secondary | ICD-10-CM | POA: Insufficient documentation

## 2021-10-23 DIAGNOSIS — R55 Syncope and collapse: Secondary | ICD-10-CM | POA: Diagnosis not present

## 2021-10-23 DIAGNOSIS — Z91040 Latex allergy status: Secondary | ICD-10-CM | POA: Insufficient documentation

## 2021-10-23 DIAGNOSIS — K5900 Constipation, unspecified: Secondary | ICD-10-CM | POA: Insufficient documentation

## 2021-10-23 DIAGNOSIS — I456 Pre-excitation syndrome: Secondary | ICD-10-CM | POA: Diagnosis not present

## 2021-10-23 LAB — CBC WITH DIFFERENTIAL/PLATELET
Abs Immature Granulocytes: 0.02 10*3/uL (ref 0.00–0.07)
Basophils Absolute: 0 10*3/uL (ref 0.0–0.1)
Basophils Relative: 1 %
Eosinophils Absolute: 0 10*3/uL (ref 0.0–0.5)
Eosinophils Relative: 1 %
HCT: 39.3 % (ref 36.0–46.0)
Hemoglobin: 12.8 g/dL (ref 12.0–15.0)
Immature Granulocytes: 0 %
Lymphocytes Relative: 20 %
Lymphs Abs: 1.5 10*3/uL (ref 0.7–4.0)
MCH: 29.9 pg (ref 26.0–34.0)
MCHC: 32.6 g/dL (ref 30.0–36.0)
MCV: 91.8 fL (ref 80.0–100.0)
Monocytes Absolute: 0.4 10*3/uL (ref 0.1–1.0)
Monocytes Relative: 6 %
Neutro Abs: 5.2 10*3/uL (ref 1.7–7.7)
Neutrophils Relative %: 72 %
Platelets: 235 10*3/uL (ref 150–400)
RBC: 4.28 MIL/uL (ref 3.87–5.11)
RDW: 13.2 % (ref 11.5–15.5)
WBC: 7.2 10*3/uL (ref 4.0–10.5)
nRBC: 0 % (ref 0.0–0.2)

## 2021-10-23 LAB — BASIC METABOLIC PANEL WITH GFR
Anion gap: 11 (ref 5–15)
BUN: 16 mg/dL (ref 6–20)
CO2: 25 mmol/L (ref 22–32)
Calcium: 9.1 mg/dL (ref 8.9–10.3)
Chloride: 103 mmol/L (ref 98–111)
Creatinine, Ser: 0.98 mg/dL (ref 0.44–1.00)
GFR, Estimated: >60 mL/min
Glucose, Bld: 126 mg/dL — ABNORMAL HIGH (ref 70–99)
Potassium: 3.6 mmol/L (ref 3.5–5.1)
Sodium: 139 mmol/L (ref 135–145)

## 2021-10-23 LAB — HCG, SERUM, QUALITATIVE: Preg, Serum: NEGATIVE

## 2021-10-23 MED ORDER — POLYETHYLENE GLYCOL 3350 17 G PO PACK
17.0000 g | PACK | Freq: Every day | ORAL | 0 refills | Status: DC
Start: 2021-10-23 — End: 2023-08-19

## 2021-10-23 NOTE — Discharge Instructions (Addendum)
Please follow-up with your primary care doctor and your gastroenterologist.  Recommend taking daily MiraLAX.  For today only I would recommend trial of more aggressive regimen -try taking 1 dose of MiraLAX every 1-2 hours up to 4 or 5 doses.

## 2021-10-23 NOTE — ED Notes (Signed)
AVS with prescriptions provided to and discussed with patient. Pt verbalizes understanding of discharge instructions and denies any questions or concerns at this time. Pt ambulated out of department independently with steady gait. ? ?

## 2021-10-23 NOTE — ED Triage Notes (Addendum)
Pt states she has been straining to pass a large bm , has had diarrhea , ( is on rx laxatives) but feel like something is still in there , states  got light  headed after straining and passed out this am

## 2021-10-24 NOTE — ED Provider Notes (Signed)
[place] EMERGENCY DEPARTMENT Provider Note   CSN: [id number] Arrival date & time: 10/23/21  0750     History Chief Complaint  Patient presents with   Constipation    Anna Serrano is a 45 y.o. female.  Presents to ER with concern for constipation and syncopal episode.  Patient states that she has a long history of constipation, takes laxatives with intermittent relief.  This morning she was straining on the bathroom and was able to have a bowel movement but when she went to stand up after a long period of straining she passed out.  Denies hitting her head.  Felt lightheaded prior to passing out.  No chest pain or difficulty in breathing.  No further episodes since.  She currently does not feel lightheaded.  Denies any medical complaints at present.  No abdominal pain nausea or vomiting.  HPI     Past Medical History:  Diagnosis Date   Anxiety    Arachnoid cyst    Arthritis    Bipolar 1 disorder ([facility])    Chronic back pain    Depression    Fibroids    Fibromyalgia    Genital herpes    Hx of hysterectomy    Hypertension    Insomnia    Interstitial cystitis    Major depressive disorder    Migraine    gets "all the time"   Migraine    Ovarian cyst    PTSD (post-traumatic stress disorder)    Wolff-Parkinson-White (WPW) syndrome    Had EP study 11/20/2013 at [facility] that ruled out WPW syndrome.     Patient Active Problem List   Diagnosis Date Noted   MDD (major depressive disorder), recurrent severe, without psychosis ([place]) 12/10/2018    Past Surgical History:  Procedure Laterality Date   ABDOMINAL HYSTERECTOMY     CARDIAC CATHETERIZATION     TOOTH EXTRACTION N/A 10/29/2014   Procedure: EXTRACTION OF TEETH #1, #16, #17 & #32;  Surgeon: Isac Caddy, DDS;  Location: [facility];  Service: Oral Surgery;  Laterality: N/A;     OB History   No obstetric history on file.     No family history on file.  Social History   Tobacco Use   Smoking status:  Never   Smokeless tobacco: Never  Vaping Use   Vaping Use: Never used  Substance Use Topics   Alcohol use: Yes    Comment: occ   Drug use: No    Home Medications Prior to Admission medications   Medication Sig Start Date End Date Taking? Authorizing Provider  polyethylene glycol (MIRALAX / GLYCOLAX) 17 g packet Take 17 g by mouth daily. 10/23/21  Yes Devanshi Califf, Ellwood Dense, MD  ALPRAZolam Duanne Moron) 1 MG tablet Take 1 tablet (1 mg total) by mouth 2 (two) times daily as needed for anxiety. 12/13/18   Connye Burkitt, NP  Cholecalciferol (VITAMIN D) 50 MCG (2000 UT) CAPS Take by mouth.    [provider]  cyclobenzaprine (FLEXERIL) 10 MG tablet Take 10 mg by mouth 3 (three) times daily. 06/03/20   [provider]  DULoxetine (CYMBALTA) 30 MG capsule Take 30 mg by mouth daily. 06/10/20   [provider]  linaclotide Rolan Lipa) 290 MCG CAPS capsule 1 cap(s) 08/07/19   [provider]  lovastatin (MEVACOR) 20 MG tablet Take by mouth. 01/10/15   [provider]  lovastatin (MEVACOR) 20 MG tablet Take by mouth. 09/01/20   [provider]  lurasidone (LATUDA) 80  MG TABS tablet Take by mouth. 06/10/20   [provider]  Lurasidone HCl 60 MG TABS Take 1 tablet (60 mg total) by mouth daily with breakfast. For mood 12/14/18   Connye Burkitt, NP  rizatriptan (MAXALT) 10 MG tablet Take by mouth. 02/13/20   [provider]  SUMAtriptan (IMITREX) 100 MG tablet Take 100 mg by mouth every 2 (two) hours as needed for migraine or headache. May repeat in 2 hours if headache persists or recurs.    [provider]  topiramate (TOPAMAX) 100 MG tablet  04/15/20   [provider]  valACYclovir (VALTREX) 1000 MG tablet Take 1 tablet (1,000 mg total) by mouth daily. 12/14/18   Connye Burkitt, NP  zolpidem (AMBIEN) 10 MG tablet TAKE 1 AND 1/2 TABLETS BY MOUTH EVERY DAY AT BEDTIME 04/19/19   [provider]  zolpidem (AMBIEN) 5 MG tablet  Take 1 tablet (5 mg total) by mouth at bedtime as needed for sleep. 12/13/18   Connye Burkitt, NP    Allergies    Gabapentin, Zonisamide, and Latex  Review of Systems   Review of Systems  Constitutional:  Negative for chills and fever.  HENT:  Negative for ear pain and sore throat.   Eyes:  Negative for pain and visual disturbance.  Respiratory:  Negative for cough and shortness of breath.   Cardiovascular:  Negative for chest pain and palpitations.  Gastrointestinal:  Positive for constipation. Negative for abdominal pain and vomiting.  Genitourinary:  Negative for dysuria and hematuria.  Musculoskeletal:  Negative for arthralgias and back pain.  Skin:  Negative for color change and rash.  Neurological:  Positive for syncope. Negative for seizures.  All other systems reviewed and are negative.  Physical Exam Updated Vital Signs BP (!) 89/59   Pulse 93   Temp 97.8 F (36.6 C) (Oral)   Resp 12   Ht 5\' 3"  (1.6 m)   Wt 93 kg   SpO2 100%   BMI 36.31 kg/m   Physical Exam Vitals and nursing note reviewed.  Constitutional:      General: She is not in acute distress.    Appearance: She is well-developed.  HENT:     Head: Normocephalic and atraumatic.  Eyes:     Conjunctiva/sclera: Conjunctivae normal.  Cardiovascular:     Rate and Rhythm: Normal rate and regular rhythm.     Heart sounds: No murmur heard. Pulmonary:     Effort: Pulmonary effort is normal. No respiratory distress.     Breath sounds: Normal breath sounds.  Abdominal:     Palpations: Abdomen is soft.     Tenderness: There is no abdominal tenderness.  Musculoskeletal:        General: No swelling.     Cervical back: Neck supple.  Skin:    General: Skin is warm and dry.     Capillary Refill: Capillary refill takes less than 2 seconds.  Neurological:     Mental Status: She is alert.  Psychiatric:        Mood and Affect: Mood normal.    ED Results / Procedures / Treatments   Labs (all labs ordered are  listed, but only abnormal results are displayed) Labs Reviewed  BASIC METABOLIC PANEL - Abnormal; Notable for the following components:      Result Value   Glucose, Bld 126 (*)    All other components within normal limits  HCG, SERUM, QUALITATIVE  CBC WITH DIFFERENTIAL/PLATELET    EKG EKG  Interpretation  Date/Time:  Thursday October 23 2021 08:33:13 EST Ventricular Rate:  99 PR Interval:  190 QRS Duration: 95 QT Interval:  364 QTC Calculation: 468 R Axis:   67 Text Interpretation: Sinus rhythm Consider right atrial enlargement Borderline T abnormalities, diffuse leads Confirmed by Madalyn Rob [phone]) on 10/23/2021 8:34:12 AM  Radiology No results found.  Procedures Procedures   Medications Ordered in ED Medications - No data to display  ED Course  I have reviewed the triage vital signs and the nursing notes.  Pertinent labs & imaging results that were available during my care of the patient were reviewed by me and considered in my medical decision making (see chart for details).    MDM Rules/Calculators/A&P                           45 year old lady presents to ER with concern for syncopal episode and constipation.  On exam she appears well in no distress with stable vital signs.  EKG demonstrated sinus rhythm with normal intervals.  Basic labs were stable.  Her abdomen was soft and nontender.  Regarding syncope, suspect vasovagal.  Recommend follow-up with primary doctor.  Regarding constipation, she did endorse having a good bowel movement this morning, does not have abdominal pain nausea or vomiting, doubt bowel obstruction.  Patient did endorse taking laxatives but has not been taking MiraLAX or milk of magnesia.  Recommended trial of MiraLAX and follow-up with her gastroenterologist as well as her primary care doctor regarding her concerns for constipation.  After the discussed management above, the patient was determined to be safe for discharge.  The patient was  in agreement with this plan and all questions regarding their care were answered.  ED return precautions were discussed and the patient will return to the ED with any significant worsening of condition.   Final Clinical Impression(s) / ED Diagnoses Final diagnoses:  Constipation, unspecified constipation type  Syncope, unspecified syncope type    Rx / DC Orders ED Discharge Orders          Ordered    polyethylene glycol (MIRALAX / GLYCOLAX) 17 g packet  Daily        10/23/21 0943             Lucrezia Starch, MD 10/24/21 [phone]

## 2022-07-01 ENCOUNTER — Other Ambulatory Visit: Payer: Self-pay | Admitting: Family Medicine

## 2022-07-01 DIAGNOSIS — Z1231 Encounter for screening mammogram for malignant neoplasm of breast: Secondary | ICD-10-CM

## 2022-08-04 ENCOUNTER — Ambulatory Visit: Payer: Medicaid Other

## 2022-08-20 ENCOUNTER — Ambulatory Visit: Payer: Medicaid Other

## 2022-09-14 ENCOUNTER — Ambulatory Visit
Admission: RE | Admit: 2022-09-14 | Discharge: 2022-09-14 | Disposition: A | Discharge status: Home / Self Care | Payer: Medicare Other | Source: Ambulatory Visit | Attending: Family Medicine | Admitting: Family Medicine

## 2022-09-14 DIAGNOSIS — Z1231 Encounter for screening mammogram for malignant neoplasm of breast: Secondary | ICD-10-CM

## 2022-09-17 ENCOUNTER — Other Ambulatory Visit: Payer: Self-pay | Admitting: Family Medicine

## 2022-09-17 DIAGNOSIS — R928 Other abnormal and inconclusive findings on diagnostic imaging of breast: Secondary | ICD-10-CM

## 2022-09-29 ENCOUNTER — Ambulatory Visit: Admission: RE | Admit: 2022-09-29 | Payer: Medicaid Other | Source: Ambulatory Visit

## 2022-09-29 ENCOUNTER — Ambulatory Visit
Admission: RE | Admit: 2022-09-29 | Discharge: 2022-09-29 | Disposition: A | Discharge status: Home / Self Care | Payer: Medicare Other | Source: Ambulatory Visit | Attending: Family Medicine | Admitting: Family Medicine

## 2022-09-29 DIAGNOSIS — R928 Other abnormal and inconclusive findings on diagnostic imaging of breast: Secondary | ICD-10-CM

## 2023-07-05 ENCOUNTER — Institutional Professional Consult (permissible substitution): Payer: Medicare Other | Admitting: Plastic Surgery

## 2023-07-21 ENCOUNTER — Emergency Department (HOSPITAL_BASED_OUTPATIENT_CLINIC_OR_DEPARTMENT_OTHER): Payer: Medicare Other

## 2023-07-21 ENCOUNTER — Other Ambulatory Visit: Payer: Self-pay

## 2023-07-21 ENCOUNTER — Encounter (HOSPITAL_BASED_OUTPATIENT_CLINIC_OR_DEPARTMENT_OTHER): Payer: Self-pay

## 2023-07-21 ENCOUNTER — Inpatient Hospital Stay (HOSPITAL_BASED_OUTPATIENT_CLINIC_OR_DEPARTMENT_OTHER)
Admission: EM | Admit: 2023-07-21 | Discharge: 2023-07-29 | DRG: 405 | Disposition: A | Discharge status: Home / Self Care | Payer: Medicare Other | Attending: Internal Medicine | Admitting: Internal Medicine

## 2023-07-21 ENCOUNTER — Inpatient Hospital Stay (HOSPITAL_COMMUNITY): Payer: Medicare Other

## 2023-07-21 DIAGNOSIS — Z9889 Other specified postprocedural states: Secondary | ICD-10-CM

## 2023-07-21 DIAGNOSIS — R1030 Lower abdominal pain, unspecified: Secondary | ICD-10-CM

## 2023-07-21 DIAGNOSIS — Z1152 Encounter for screening for COVID-19: Secondary | ICD-10-CM | POA: Diagnosis not present

## 2023-07-21 DIAGNOSIS — F332 Major depressive disorder, recurrent severe without psychotic features: Secondary | ICD-10-CM | POA: Diagnosis present

## 2023-07-21 DIAGNOSIS — K2971 Gastritis, unspecified, with bleeding: Secondary | ICD-10-CM | POA: Diagnosis not present

## 2023-07-21 DIAGNOSIS — M797 Fibromyalgia: Secondary | ICD-10-CM | POA: Diagnosis not present

## 2023-07-21 DIAGNOSIS — E785 Hyperlipidemia, unspecified: Secondary | ICD-10-CM | POA: Diagnosis present

## 2023-07-21 DIAGNOSIS — R7989 Other specified abnormal findings of blood chemistry: Secondary | ICD-10-CM

## 2023-07-21 DIAGNOSIS — I10 Essential (primary) hypertension: Secondary | ICD-10-CM | POA: Diagnosis not present

## 2023-07-21 DIAGNOSIS — F331 Major depressive disorder, recurrent, moderate: Secondary | ICD-10-CM | POA: Diagnosis present

## 2023-07-21 DIAGNOSIS — K449 Diaphragmatic hernia without obstruction or gangrene: Secondary | ICD-10-CM | POA: Diagnosis present

## 2023-07-21 DIAGNOSIS — K8071 Calculus of gallbladder and bile duct without cholecystitis with obstruction: Secondary | ICD-10-CM | POA: Diagnosis not present

## 2023-07-21 DIAGNOSIS — Z91040 Latex allergy status: Secondary | ICD-10-CM | POA: Diagnosis not present

## 2023-07-21 DIAGNOSIS — R7401 Elevation of levels of liver transaminase levels: Secondary | ICD-10-CM | POA: Diagnosis present

## 2023-07-21 DIAGNOSIS — Z0130 Encounter for examination of blood pressure without abnormal findings: Secondary | ICD-10-CM

## 2023-07-21 DIAGNOSIS — R55 Syncope and collapse: Secondary | ICD-10-CM | POA: Diagnosis not present

## 2023-07-21 DIAGNOSIS — K8050 Calculus of bile duct without cholangitis or cholecystitis without obstruction: Secondary | ICD-10-CM

## 2023-07-21 DIAGNOSIS — Z9884 Bariatric surgery status: Secondary | ICD-10-CM | POA: Diagnosis not present

## 2023-07-21 DIAGNOSIS — K5909 Other constipation: Secondary | ICD-10-CM | POA: Diagnosis present

## 2023-07-21 DIAGNOSIS — Z803 Family history of malignant neoplasm of breast: Secondary | ICD-10-CM

## 2023-07-21 DIAGNOSIS — Z5941 Food insecurity: Secondary | ICD-10-CM | POA: Diagnosis not present

## 2023-07-21 DIAGNOSIS — Z888 Allergy status to other drugs, medicaments and biological substances status: Secondary | ICD-10-CM

## 2023-07-21 DIAGNOSIS — G43909 Migraine, unspecified, not intractable, without status migrainosus: Secondary | ICD-10-CM | POA: Diagnosis present

## 2023-07-21 DIAGNOSIS — G8918 Other acute postprocedural pain: Secondary | ICD-10-CM

## 2023-07-21 DIAGNOSIS — R102 Pelvic and perineal pain: Secondary | ICD-10-CM | POA: Diagnosis present

## 2023-07-21 DIAGNOSIS — K3189 Other diseases of stomach and duodenum: Secondary | ICD-10-CM | POA: Diagnosis present

## 2023-07-21 DIAGNOSIS — Z90710 Acquired absence of both cervix and uterus: Secondary | ICD-10-CM

## 2023-07-21 DIAGNOSIS — R748 Abnormal levels of other serum enzymes: Secondary | ICD-10-CM | POA: Diagnosis present

## 2023-07-21 DIAGNOSIS — E876 Hypokalemia: Secondary | ICD-10-CM | POA: Diagnosis not present

## 2023-07-21 DIAGNOSIS — D62 Acute posthemorrhagic anemia: Secondary | ICD-10-CM | POA: Diagnosis not present

## 2023-07-21 DIAGNOSIS — Z79899 Other long term (current) drug therapy: Secondary | ICD-10-CM

## 2023-07-21 DIAGNOSIS — R000 Tachycardia, unspecified: Secondary | ICD-10-CM | POA: Diagnosis not present

## 2023-07-21 DIAGNOSIS — K921 Melena: Secondary | ICD-10-CM | POA: Insufficient documentation

## 2023-07-21 DIAGNOSIS — K831 Obstruction of bile duct: Secondary | ICD-10-CM | POA: Diagnosis present

## 2023-07-21 DIAGNOSIS — K2970 Gastritis, unspecified, without bleeding: Secondary | ICD-10-CM | POA: Diagnosis not present

## 2023-07-21 DIAGNOSIS — Z5986 Financial insecurity: Secondary | ICD-10-CM

## 2023-07-21 LAB — URINALYSIS, ROUTINE W REFLEX MICROSCOPIC
Glucose, UA: 100 mg/dL — AB
Hgb urine dipstick: NEGATIVE
Ketones, ur: NEGATIVE mg/dL
Leukocytes,Ua: NEGATIVE
Nitrite: NEGATIVE
Protein, ur: NEGATIVE mg/dL
Specific Gravity, Urine: >=1.03 (ref 1.005–1.030)
pH: 5.5 (ref 5.0–8.0)

## 2023-07-21 LAB — COMPREHENSIVE METABOLIC PANEL
ALT: 445 U/L — ABNORMAL HIGH (ref 0–44)
AST: 454 U/L — ABNORMAL HIGH (ref 15–41)
Albumin: 3.1 g/dL — ABNORMAL LOW (ref 3.5–5.0)
Alkaline Phosphatase: 488 U/L — ABNORMAL HIGH (ref 38–126)
Anion gap: 9 (ref 5–15)
BUN: 9 mg/dL (ref 6–20)
CO2: 22 mmol/L (ref 22–32)
Calcium: 8.9 mg/dL (ref 8.9–10.3)
Chloride: 107 mmol/L (ref 98–111)
Creatinine, Ser: 0.73 mg/dL (ref 0.44–1.00)
GFR, Estimated: >60 mL/min (ref >60)
Glucose, Bld: 105 mg/dL — ABNORMAL HIGH (ref 70–99)
Potassium: 3.6 mmol/L (ref 3.5–5.1)
Sodium: 138 mmol/L (ref 135–145)
Total Bilirubin: 3.3 mg/dL — ABNORMAL HIGH (ref 0.3–1.2)
Total Protein: 6.6 g/dL (ref 6.5–8.1)

## 2023-07-21 LAB — CBC
HCT: 31.2 % — ABNORMAL LOW (ref 36.0–46.0)
Hemoglobin: 10.3 g/dL — ABNORMAL LOW (ref 12.0–15.0)
MCH: 31.2 pg (ref 26.0–34.0)
MCHC: 33 g/dL (ref 30.0–36.0)
MCV: 94.5 fL (ref 80.0–100.0)
Platelets: 419 10*3/uL — ABNORMAL HIGH (ref 150–400)
RBC: 3.3 MIL/uL — ABNORMAL LOW (ref 3.87–5.11)
RDW: 14.6 % (ref 11.5–15.5)
WBC: 7 10*3/uL (ref 4.0–10.5)
nRBC: 0 % (ref 0.0–0.2)

## 2023-07-21 LAB — SARS CORONAVIRUS 2 BY RT PCR: SARS Coronavirus 2 by RT PCR: NEGATIVE

## 2023-07-21 LAB — CBC WITH DIFFERENTIAL/PLATELET
Abs Immature Granulocytes: 0.05 10*3/uL (ref 0.00–0.07)
Basophils Absolute: 0 10*3/uL (ref 0.0–0.1)
Basophils Relative: 1 %
Eosinophils Absolute: 0.4 10*3/uL (ref 0.0–0.5)
Eosinophils Relative: 5 %
HCT: 32.4 % — ABNORMAL LOW (ref 36.0–46.0)
Hemoglobin: 10.9 g/dL — ABNORMAL LOW (ref 12.0–15.0)
Immature Granulocytes: 1 %
Lymphocytes Relative: 27 %
Lymphs Abs: 2 10*3/uL (ref 0.7–4.0)
MCH: 30.7 pg (ref 26.0–34.0)
MCHC: 33.6 g/dL (ref 30.0–36.0)
MCV: 91.3 fL (ref 80.0–100.0)
Monocytes Absolute: 0.5 10*3/uL (ref 0.1–1.0)
Monocytes Relative: 6 %
Neutro Abs: 4.5 10*3/uL (ref 1.7–7.7)
Neutrophils Relative %: 60 %
Platelets: 452 10*3/uL — ABNORMAL HIGH (ref 150–400)
RBC: 3.55 MIL/uL — ABNORMAL LOW (ref 3.87–5.11)
RDW: 14.3 % (ref 11.5–15.5)
WBC: 7.4 10*3/uL (ref 4.0–10.5)
nRBC: 0 % (ref 0.0–0.2)

## 2023-07-21 LAB — CREATININE, SERUM
Creatinine, Ser: 0.62 mg/dL (ref 0.44–1.00)
GFR, Estimated: >60 mL/min (ref >60)

## 2023-07-21 LAB — LIPASE, BLOOD: Lipase: 27 U/L (ref 11–51)

## 2023-07-21 MED ORDER — SENNOSIDES-DOCUSATE SODIUM 8.6-50 MG PO TABS
2.0000 | ORAL_TABLET | Freq: Two times a day (BID) | ORAL | Status: DC
  Administered 2023-07-21 – 2023-07-29 (×15): 2 via ORAL
  Filled 2023-07-21 (×14): qty 2

## 2023-07-21 MED ORDER — SUMATRIPTAN SUCCINATE 50 MG PO TABS
50.0000 mg | ORAL_TABLET | ORAL | Status: DC | PRN
  Administered 2023-07-21 – 2023-07-24 (×2): 50 mg via ORAL
  Filled 2023-07-21 (×5): qty 1

## 2023-07-21 MED ORDER — GADOBUTROL 1 MMOL/ML IV SOLN
7.5000 mL | Freq: Once | INTRAVENOUS | Status: AC | PRN
  Administered 2023-07-21: 7.5 mL via INTRAVENOUS

## 2023-07-21 MED ORDER — OXYCODONE HCL 5 MG PO TABS
5.0000 mg | ORAL_TABLET | ORAL | Status: DC | PRN
  Administered 2023-07-21 – 2023-07-22 (×4): 5 mg via ORAL
  Filled 2023-07-21 (×4): qty 1

## 2023-07-21 MED ORDER — PROMETHAZINE HCL 25 MG/ML IJ SOLN
INTRAMUSCULAR | Status: AC
  Filled 2023-07-21: qty 1

## 2023-07-21 MED ORDER — NORTRIPTYLINE HCL 25 MG PO CAPS
50.0000 mg | ORAL_CAPSULE | Freq: Every day | ORAL | Status: DC
  Administered 2023-07-21 – 2023-07-28 (×8): 50 mg via ORAL
  Filled 2023-07-21 (×9): qty 2

## 2023-07-21 MED ORDER — METOCLOPRAMIDE HCL 5 MG/ML IJ SOLN
10.0000 mg | Freq: Once | INTRAMUSCULAR | Status: AC
  Administered 2023-07-21: 10 mg via INTRAVENOUS
  Filled 2023-07-21: qty 2

## 2023-07-21 MED ORDER — VALACYCLOVIR HCL 500 MG PO TABS
1000.0000 mg | ORAL_TABLET | Freq: Every day | ORAL | Status: DC
  Administered 2023-07-21 – 2023-07-29 (×9): 1000 mg via ORAL
  Filled 2023-07-21 (×9): qty 2

## 2023-07-21 MED ORDER — ZOLPIDEM TARTRATE 5 MG PO TABS
5.0000 mg | ORAL_TABLET | Freq: Once | ORAL | Status: AC
  Administered 2023-07-21: 5 mg via ORAL
  Filled 2023-07-21: qty 1

## 2023-07-21 MED ORDER — HYDROMORPHONE HCL 1 MG/ML IJ SOLN
0.5000 mg | Freq: Once | INTRAMUSCULAR | Status: AC
  Administered 2023-07-21: 0.5 mg via INTRAVENOUS
  Filled 2023-07-21: qty 1

## 2023-07-21 MED ORDER — ENOXAPARIN SODIUM 40 MG/0.4ML IJ SOSY
40.0000 mg | PREFILLED_SYRINGE | INTRAMUSCULAR | Status: DC
  Administered 2023-07-22 – 2023-07-25 (×3): 40 mg via SUBCUTANEOUS
  Filled 2023-07-21 (×3): qty 0.4

## 2023-07-21 MED ORDER — ONDANSETRON HCL 4 MG/2ML IJ SOLN
4.0000 mg | Freq: Once | INTRAMUSCULAR | Status: AC
  Administered 2023-07-21: 4 mg via INTRAVENOUS
  Filled 2023-07-21: qty 2

## 2023-07-21 MED ORDER — ESCITALOPRAM OXALATE 10 MG PO TABS
10.0000 mg | ORAL_TABLET | Freq: Every day | ORAL | Status: DC
  Administered 2023-07-21 – 2023-07-29 (×9): 10 mg via ORAL
  Filled 2023-07-21 (×9): qty 1

## 2023-07-21 MED ORDER — ZOLPIDEM TARTRATE 5 MG PO TABS: 5.0000 mg | ORAL_TABLET | Freq: Once | ORAL | Status: DC

## 2023-07-21 MED ORDER — IOHEXOL 300 MG/ML  SOLN
100.0000 mL | Freq: Once | INTRAMUSCULAR | Status: AC | PRN
  Administered 2023-07-21: 100 mL via INTRAVENOUS

## 2023-07-21 MED ORDER — PROMETHAZINE (PHENERGAN) 6.25MG IN NS 50ML IVPB
6.2500 mg | INTRAVENOUS | Status: AC
  Administered 2023-07-21: 6.25 mg via INTRAVENOUS
  Filled 2023-07-21: qty 50

## 2023-07-21 MED ORDER — KETOROLAC TROMETHAMINE 15 MG/ML IJ SOLN
15.0000 mg | Freq: Once | INTRAMUSCULAR | Status: AC
  Administered 2023-07-21: 15 mg via INTRAVENOUS
  Filled 2023-07-21: qty 1

## 2023-07-21 MED ORDER — CYCLOBENZAPRINE HCL 10 MG PO TABS: 10.0000 mg | ORAL_TABLET | Freq: Three times a day (TID) | ORAL | Status: DC | PRN

## 2023-07-21 MED ORDER — ACETAMINOPHEN 650 MG RE SUPP: 650.0000 mg | Freq: Four times a day (QID) | RECTAL | Status: DC | PRN

## 2023-07-21 MED ORDER — ARIPIPRAZOLE 2 MG PO TABS
2.0000 mg | ORAL_TABLET | Freq: Every day | ORAL | Status: DC
  Administered 2023-07-21 – 2023-07-29 (×9): 2 mg via ORAL
  Filled 2023-07-21 (×9): qty 1

## 2023-07-21 MED ORDER — LURASIDONE HCL 20 MG PO TABS
20.0000 mg | ORAL_TABLET | Freq: Every day | ORAL | Status: DC
  Administered 2023-07-21 – 2023-07-29 (×9): 20 mg via ORAL
  Filled 2023-07-21 (×9): qty 1

## 2023-07-21 MED ORDER — SODIUM CHLORIDE 0.9% FLUSH
3.0000 mL | Freq: Two times a day (BID) | INTRAVENOUS | Status: DC
  Administered 2023-07-21 – 2023-07-26 (×9): 3 mL via INTRAVENOUS
  Administered 2023-07-27: 10 mL via INTRAVENOUS
  Administered 2023-07-27 – 2023-07-29 (×4): 3 mL via INTRAVENOUS

## 2023-07-21 MED ORDER — LACTATED RINGERS IV BOLUS
1000.0000 mL | Freq: Once | INTRAVENOUS | Status: AC
  Administered 2023-07-21: 1000 mL via INTRAVENOUS

## 2023-07-21 MED ORDER — PROMETHAZINE HCL 25 MG PO TABS: 25.0000 mg | ORAL_TABLET | Freq: Three times a day (TID) | ORAL | Status: DC | PRN

## 2023-07-21 MED ORDER — ATORVASTATIN CALCIUM 10 MG PO TABS
10.0000 mg | ORAL_TABLET | Freq: Every day | ORAL | Status: DC
  Administered 2023-07-21 – 2023-07-28 (×8): 10 mg via ORAL
  Filled 2023-07-21 (×8): qty 1

## 2023-07-21 MED ORDER — ACETAMINOPHEN 325 MG PO TABS
650.0000 mg | ORAL_TABLET | Freq: Four times a day (QID) | ORAL | Status: DC | PRN
  Administered 2023-07-26: 650 mg via ORAL
  Filled 2023-07-21: qty 2

## 2023-07-21 MED ORDER — LINACLOTIDE 145 MCG PO CAPS
290.0000 ug | ORAL_CAPSULE | Freq: Every day | ORAL | Status: DC
  Administered 2023-07-22 – 2023-07-29 (×4): 290 ug via ORAL
  Filled 2023-07-21 (×9): qty 2

## 2023-07-21 MED ORDER — POLYETHYLENE GLYCOL 3350 17 G PO PACK: 17.0000 g | PACK | Freq: Every day | ORAL | Status: DC | PRN

## 2023-07-21 MED ORDER — SODIUM CHLORIDE 0.9 % IV SOLN: INTRAVENOUS | Status: DC

## 2023-07-21 MED ORDER — DULOXETINE HCL 30 MG PO CPEP
60.0000 mg | ORAL_CAPSULE | Freq: Every day | ORAL | Status: DC
  Administered 2023-07-21 – 2023-07-29 (×9): 60 mg via ORAL
  Filled 2023-07-21 (×9): qty 2

## 2023-07-21 NOTE — Assessment & Plan Note (Addendum)
Patient on several psych meds, at this time no apparent adverse effect, continue with same. I will request psych eval of meds.

## 2023-07-21 NOTE — H&P (Addendum)
History and Physical    Patient: Anna Serrano:[id number] DOB: 1976/06/02 DOA: 07/21/2023 DOS: the patient was seen and examined on 07/21/2023 PCP: Anna Mis, MD  Patient coming from: Home  Chief Complaint:  Chief Complaint  Patient presents with   Post-op Problem   HPI: Anna Serrano is a 47 y.o. female with medical history significant of panniculectomy done by plastic surgeon 2 days ago.  Patient reports that initially she was doing well after the surgery and able to tolerate diet, however starting late last evening patient reports generalized abdominal discomfort and not feeling well.  She did not have any vomiting diarrhea fevers or chills or abdominal distention.  Patient still has multiple drains in the abdomen from her procedure, no change in the discharge type is noted.  Patient currently recalls that she has had intermittent episodes as above over the last 3 months lasting about 2-4 hours.  Self resolving.  Patient initially presented to [facility] with above complaints where a CAT scan raised concern of choledocholithiasis.  Medical evaluation is sought.  Patient is seen at [facility] long MedSurg unit.  Patient's pain has currently improved.  Patient is tolerating diet and is nontoxic patient had slight headache earlier that she described as her migraine and just took Imitrex. Review of Systems: As mentioned in the history of present illness. All other systems reviewed and are negative. Past Medical History:  Diagnosis Date   Anxiety    Arachnoid cyst    Arthritis    Bipolar 1 disorder (HCC)    Chronic back pain    Depression    Fibroids    Fibromyalgia    Genital herpes    Hx of hysterectomy    Hypertension    Insomnia    Interstitial cystitis    Major depressive disorder    Migraine    gets "all the time"   Migraine    Ovarian cyst    PTSD (post-traumatic stress disorder)    Wolff-Parkinson-White (WPW) syndrome    Had EP study 11/20/2013 at [facility] that  ruled out WPW syndrome.    Past Surgical History:  Procedure Laterality Date   ABDOMINAL HYSTERECTOMY     CARDIAC CATHETERIZATION     TOOTH EXTRACTION N/A 10/29/2014   Procedure: EXTRACTION OF TEETH #1, #16, #17 & #32;  Surgeon: Anna Serrano, DDS;  Location: [facility] OR;  Service: Oral Surgery;  Laterality: N/A;   Social History:  reports that she has never smoked. She has never used smokeless tobacco. She reports current alcohol use. She reports that she does not use drugs.  Allergies  Allergen Reactions   Gabapentin Other (See Comments)    Side effects, joint pain   Topiramate Other (See Comments)    paresthesias   Zonisamide Other (See Comments)    Insomnia/increased HA   Tizanidine Other (See Comments)    Confusion, psychosis    Latex Rash   Rimegepant Nausea And Vomiting and Other (See Comments)    Abdominal pain    Family History  Problem Relation Age of Onset   Breast cancer Paternal Aunt 86 - 13    Prior to Admission medications   Medication Sig Start Date End Date Taking? Authorizing Provider  ARIPiprazole (ABILIFY) 2 MG tablet Take 2 mg by mouth daily. 06/22/23  Yes [provider]  atorvastatin (LIPITOR) 10 MG tablet Take 10 mg by mouth at bedtime. 05/18/23  Yes [provider]  cyclobenzaprine (FLEXERIL) 10 MG tablet Take 10 mg  by mouth 3 (three) times daily as needed for muscle spasms. 06/03/20  Yes [provider]  DULoxetine (CYMBALTA) 60 MG capsule Take 60 mg by mouth daily. 05/05/23  Yes [provider]  escitalopram (LEXAPRO) 10 MG tablet Take 10 mg by mouth daily. 07/07/23  Yes [provider]  finasteride (PROSCAR) 5 MG tablet Take 5 mg by mouth daily. 05/31/23  Yes [provider]  ketoconazole (NIZORAL) 2 % shampoo Apply 1 Application topically once a week. Wednesdays 06/19/22  Yes [provider]  linaclotide Karlene Einstein) 290 MCG CAPS capsule Take 290 mcg by mouth daily before breakfast. 08/07/19  Yes  [provider]  lovastatin (MEVACOR) 20 MG tablet Take 20 mg by mouth at bedtime. 09/01/20  Yes [provider]  lurasidone (LATUDA) 20 MG TABS tablet Take 20 mg by mouth daily. 07/07/23  Yes [provider]  nortriptyline (PAMELOR) 50 MG capsule Take 50 mg by mouth at bedtime. 07/19/23  Yes [provider]  oxyCODONE (OXY IR/ROXICODONE) 5 MG immediate release tablet Take 5 mg by mouth every 6 (six) hours as needed for moderate pain. 07/12/23  Yes [provider]  polyethylene glycol (MIRALAX / GLYCOLAX) 17 g packet Take 17 g by mouth daily. Patient taking differently: Take 17 g by mouth as needed for mild constipation. 10/23/21  Yes Anna Loll, MD  promethazine (PHENERGAN) 25 MG tablet Take 25 mg by mouth every 8 (eight) hours as needed for nausea or vomiting. 12/02/22  Yes [provider]  rizatriptan (MAXALT) 10 MG tablet Take 10 mg by mouth as needed for migraine. 02/13/20  Yes [provider]  valACYclovir (VALTREX) 1000 MG tablet Take 1 tablet (1,000 mg total) by mouth daily. 12/14/18  Yes Anna Baker, NP  zolpidem (AMBIEN) 10 MG tablet Take 15 mg by mouth at bedtime as needed for sleep. 04/19/19  Yes [provider]  ALPRAZolam Prudy Feeler) 1 MG tablet Take 1 tablet (1 mg total) by mouth 2 (two) times daily as needed for anxiety. Patient not taking: Reported on 07/21/2023 12/13/18   Anna Baker, NP    Physical Exam: Vitals:   07/21/23 1500 07/21/23 1515 07/21/23 1545 07/21/23 1741  BP: (!) 96/56 103/70 (!) 90/49 108/79  Pulse:    80  Resp:    18  Temp:    (!) 97.3 F (36.3 C)  TempSrc:      SpO2:    100%  Weight:      Height:       General Anna Serrano is alert awake oriented oriented x 3 does not appear to be distressed, pleasant, laughing abdominal pain is very well-controlled Respiratory; bilateral intravesicular Cardiovascular exam S1-S2 normal Abdomen: Abdominal binder is in place.  There are various J-tube  draining clear/slightly bloody fluid.  No pus is noted.  There is midline incision with Steri-Strips. Soft. Diffuse tenerness with no guarding or reboudn. Extremity warm, no edema. Non toxic apearance. Data Reviewed:  Labs on Admission:  Results for orders placed or performed during the hospital encounter of 07/21/23 (from the past 24 hour(s))  Comprehensive metabolic panel     Status: Abnormal   Collection Time: 07/21/23  7:00 AM  Result Value Ref Range   Sodium 138 135 - 145 mmol/L   Potassium 3.6 3.5 - 5.1 mmol/L   Chloride 107 98 - 111 mmol/L   CO2 22 22 - 32 mmol/L   Glucose, Bld 105 (H) 70 - 99 mg/dL   BUN 9  6 - 20 mg/dL   Creatinine, Ser 1.61 0.44 - 1.00 mg/dL   Calcium 8.9 8.9 - 09.6 mg/dL   Total Protein 6.6 6.5 - 8.1 g/dL   Albumin 3.1 (L) 3.5 - 5.0 g/dL   AST 045 (H) 15 - 41 U/L   ALT 445 (H) 0 - 44 U/L   Alkaline Phosphatase 488 (H) 38 - 126 U/L   Total Bilirubin 3.3 (H) 0.3 - 1.2 mg/dL   GFR, Estimated >40 >98 mL/min   Anion gap 9 5 - 15  Lipase, blood     Status: None   Collection Time: 07/21/23  7:00 AM  Result Value Ref Range   Lipase 27 11 - 51 U/L  CBC with Diff     Status: Abnormal   Collection Time: 07/21/23  7:00 AM  Result Value Ref Range   WBC 7.4 4.0 - 10.5 K/uL   RBC 3.55 (L) 3.87 - 5.11 MIL/uL   Hemoglobin 10.9 (L) 12.0 - 15.0 g/dL   HCT 11.9 (L) 14.7 - 82.9 %   MCV 91.3 80.0 - 100.0 fL   MCH 30.7 26.0 - 34.0 pg   MCHC 33.6 30.0 - 36.0 g/dL   RDW 56.2 13.0 - 86.5 %   Platelets 452 (H) 150 - 400 K/uL   nRBC 0.0 0.0 - 0.2 %   Neutrophils Relative % 60 %   Neutro Abs 4.5 1.7 - 7.7 K/uL   Lymphocytes Relative 27 %   Lymphs Abs 2.0 0.7 - 4.0 K/uL   Monocytes Relative 6 %   Monocytes Absolute 0.5 0.1 - 1.0 K/uL   Eosinophils Relative 5 %   Eosinophils Absolute 0.4 0.0 - 0.5 K/uL   Basophils Relative 1 %   Basophils Absolute 0.0 0.0 - 0.1 K/uL   Immature Granulocytes 1 %   Abs Immature Granulocytes 0.05 0.00 - 0.07 K/uL  Urinalysis, Routine w  reflex microscopic -Urine, Clean Catch     Status: Abnormal   Collection Time: 07/21/23  7:00 AM  Result Value Ref Range   Color, Urine YELLOW YELLOW   APPearance CLEAR CLEAR   Specific Gravity, Urine >=1.030 1.005 - 1.030   pH 5.5 5.0 - 8.0   Glucose, UA 100 (A) NEGATIVE mg/dL   Hgb urine dipstick NEGATIVE NEGATIVE   Bilirubin Urine LARGE (A) NEGATIVE   Ketones, ur NEGATIVE NEGATIVE mg/dL   Protein, ur NEGATIVE NEGATIVE mg/dL   Nitrite NEGATIVE NEGATIVE   Leukocytes,Ua NEGATIVE NEGATIVE  SARS Coronavirus 2 by RT PCR (hospital order, performed in [facility] hospital lab) *cepheid single result test* Anterior Nasal Swab     Status: None   Collection Time: 07/21/23  7:49 AM   Specimen: Anterior Nasal Swab  Result Value Ref Range   SARS Coronavirus 2 by RT PCR NEGATIVE NEGATIVE   Basic Metabolic Panel: Recent Labs  Lab 07/21/23 0700  NA 138  K 3.6  CL 107  CO2 22  GLUCOSE 105*  BUN 9  CREATININE 0.73  CALCIUM 8.9   Liver Function Tests: Recent Labs  Lab 07/21/23 0700  AST 454*  ALT 445*  ALKPHOS 488*  BILITOT 3.3*  PROT 6.6  ALBUMIN 3.1*   Recent Labs  Lab 07/21/23 0700  LIPASE 27   No results for input(s): "AMMONIA" in the last 168 hours. CBC: Recent Labs  Lab 07/21/23 0700  WBC 7.4  NEUTROABS 4.5  HGB 10.9*  HCT 32.4*  MCV 91.3  PLT 452*   Cardiac Enzymes: No results for input(s): "  CKTOTAL", "CKMB", "CKMBINDEX", "TROPONINIHS" in the last 168 hours.  BNP (last 3 results) No results for input(s): "PROBNP" in the last 8760 hours. CBG: No results for input(s): "GLUCAP" in the last 168 hours.  Radiological Exams on Admission:  CT ABDOMEN PELVIS W CONTRAST  Result Date: 07/21/2023 CLINICAL DATA:  Recent panniculectomy, abdominal pain, nausea EXAM: CT ABDOMEN AND PELVIS WITH CONTRAST TECHNIQUE: Multidetector CT imaging of the abdomen and pelvis was performed using the standard protocol following bolus administration of intravenous contrast. RADIATION  DOSE REDUCTION: This exam was performed according to the departmental dose-optimization program which includes automated exposure control, adjustment of the mA and/or kV according to patient size and/or use of iterative reconstruction technique. CONTRAST:  OMNIPAQUE IOHEXOL 300 MG/ML  SOLN COMPARISON:  10/22/2005 FINDINGS: Lower chest: Lung bases are clear. Hepatobiliary: Liver is within normal limits. Gallbladder is unremarkable. Mild intrahepatic and extrahepatic ductal dilatation. Common duct measures 15 mm centrally (series 5/image 84). Suspected small distal CBD stone (series 2/image 32), poorly visualized. Pancreas: Within normal limits. Spleen: Within normal limits. Adrenals/Urinary Tract: Adrenal glands are within normal limits. Kidneys are within normal limits.  No hydronephrosis. Bladder is underdistended but unremarkable. 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 No evidence of bowel obstruction. Normal appendix (series 2/image 64). No colonic wall thickening or inflammatory changes. Vascular/Lymphatic: Aneurysm No suspicious abdominopelvic lymphadenopathy. Reproductive: Status post hysterectomy. No adnexal masses. Other: No abdominopelvic ascites. Musculoskeletal: Postsurgical changes with two surgical drains along the anterior abdominal wall. No drainable fluid collection/seroma. Visualized osseous structures are within normal limits. IMPRESSION: Postsurgical changes related to recent panniculectomy. No drainable fluid collection/seroma. Mild intrahepatic and extrahepatic ductal dilatation. Suspected small distal CBD stone, poorly visualized. In the setting of abnormal LFTs, consider ERCP or MRCP for further evaluation. Electronically Signed   By: Charline Bills M.D.   On: 07/21/2023 09:35      Assessment and Plan: * Elevated LFTs Presents with worsening abdominal pain after panniculectomy.  Reminiscent of episodic abdominal pain that she has had for the last 3  months.  Workup shows elevated LFTs and mixed/cholestatic pattern.  With associated CBD dilatation on CAT scan.  High suspicion for choledocholithiasis, at this time MRI has been done and is pending review by radiologist.  GI has been engaged and plan would be for possible ERCP tomorrow morning and is n.p.o. past midnight.  There is no evidence of any fever or white count to suggest cholangitis.  Continue to monitor clinically in this regard.  Patient on regular diet at this time trend LFTs. Will defer medical hepattiis work up till [employer] are finalized.  Dyslipidemia I am not sure why the patient is both on atorvastatin and lovastatin at this time.  I will continue with atorvastatin in the hospital.  Blood pressure check Pressure is soft, but seems consistent with patient's chronic status, there is no concern for shock or toxic appearance.  MDD (major depressive disorder), recurrent severe, without psychosis (HCC) Patient on several psych meds, at this time no apparent adverse effect, continue with same. I will request psych eval of meds.   Not sure why patient is on finasteride, resume same on Dc   Advance Care Planning:   Code Status: Prior full code.  Consults: GI  Family Communication: per patient.  Severity of Illness: The appropriate patient status for this patient is inpatient status. Author: Nolberto Hanlon, MD 07/21/2023 7:22 PM  For on call review [email or web address].

## 2023-07-21 NOTE — Assessment & Plan Note (Signed)
I am not sure why the patient is both on atorvastatin and lovastatin at this time.  I will continue with atorvastatin in the hospital.

## 2023-07-21 NOTE — ED Notes (Signed)
Pt advised her girdle binder was rubbing her bra-line. She requested gauze to be placed against the skin between the binder and her breast to prevent it rubbing raw. Nonadherent gauze placed in region against skin. No skin breakdown noted by this medic.   Pt also advised she's developed a migraine. EDP informed.

## 2023-07-21 NOTE — ED Provider Notes (Signed)
[place] [facility] AT [facility] Provider Note   CSN: [id number] Arrival date & time: 07/21/23  0636     History  Chief Complaint  Patient presents with   Post-op Problem    Anna Serrano is a 47 y.o. female.  47 year old female with history of panniculitis status post panniculectomy on 07/12/2023 and gastric sleeve 2 years ago who presents to the emergency department with nausea, dizziness, fatigue, and abdominal pain.  Over the past several days patient has had the above symptoms.  Also is having some chills.  No fevers at all.  No runny nose or sore throat or cough.  Says that her abdomen is tender across the lower portion near the incision sites but has not had any drainage.  Has been having serosanguineous output from her 2 JP drains.  Says that she ran out of her oxycodone recently and has been unable to sleep.  Says she has chronic constipation as well.  No vomiting though.  Also has had decreased p.o. intake.       Home Medications Prior to Admission medications   Medication Sig Start Date End Date Taking? Authorizing Provider  ARIPiprazole (ABILIFY) 2 MG tablet Take 2 mg by mouth daily. 06/22/23  Yes [provider]  atorvastatin (LIPITOR) 10 MG tablet Take 10 mg by mouth at bedtime. 05/18/23  Yes [provider]  cyclobenzaprine (FLEXERIL) 10 MG tablet Take 10 mg by mouth 3 (three) times daily as needed for muscle spasms. 06/03/20  Yes [provider]  DULoxetine (CYMBALTA) 60 MG capsule Take 60 mg by mouth daily. 05/05/23  Yes [provider]  escitalopram (LEXAPRO) 10 MG tablet Take 10 mg by mouth daily. 07/07/23  Yes [provider]  finasteride (PROSCAR) 5 MG tablet Take 5 mg by mouth daily. 05/31/23  Yes [provider]  ketoconazole (NIZORAL) 2 % shampoo Apply 1 Application topically once a week. Wednesdays 06/19/22  Yes [provider]  linaclotide Karlene Einstein) 290 MCG CAPS capsule Take 290  mcg by mouth daily before breakfast. 08/07/19  Yes [provider]  lovastatin (MEVACOR) 20 MG tablet Take 20 mg by mouth at bedtime. 09/01/20  Yes [provider]  lurasidone (LATUDA) 20 MG TABS tablet Take 20 mg by mouth daily. 07/07/23  Yes [provider]  nortriptyline (PAMELOR) 50 MG capsule Take 50 mg by mouth at bedtime. 07/19/23  Yes [provider]  oxyCODONE (OXY IR/ROXICODONE) 5 MG immediate release tablet Take 5 mg by mouth every 6 (six) hours as needed for moderate pain. 07/12/23  Yes [provider]  polyethylene glycol (MIRALAX / GLYCOLAX) 17 g packet Take 17 g by mouth daily. Patient taking differently: Take 17 g by mouth as needed for mild constipation. 10/23/21  Yes Milagros Loll, MD  promethazine (PHENERGAN) 25 MG tablet Take 25 mg by mouth every 8 (eight) hours as needed for nausea or vomiting. 12/02/22  Yes [provider]  rizatriptan (MAXALT) 10 MG tablet Take 10 mg by mouth as needed for migraine. 02/13/20  Yes [provider]  valACYclovir (VALTREX) 1000 MG tablet Take 1 tablet (1,000 mg total) by mouth daily. 12/14/18  Yes Aldean Baker, NP  zolpidem (AMBIEN) 10 MG tablet Take 15 mg by mouth at bedtime as needed for sleep. 04/19/19  Yes [provider]  ALPRAZolam Prudy Feeler) 1 MG tablet Take 1 tablet (1 mg total) by mouth 2 (two) times daily as needed for anxiety. Patient not taking: Reported  on 07/21/2023 12/13/18   Aldean Baker, NP      Allergies    Gabapentin, Topiramate, Zonisamide, Tizanidine, Latex, and Rimegepant    Review of Systems   Review of Systems  Physical Exam Updated Vital Signs BP (!) 90/49   Pulse 75   Temp 98 F (36.7 C) (Oral)   Resp 18   Ht 5\' 3"  (1.6 m)   Wt 74.8 kg   SpO2 100%   BMI 29.23 kg/m  Physical Exam Vitals and nursing note reviewed.  Constitutional:      General: She is not in acute distress.    Appearance: She is well-developed.  HENT:     Head:  Normocephalic and atraumatic.     Right Ear: External ear normal.     Left Ear: External ear normal.     Nose: Nose normal.  Eyes:     Extraocular Movements: Extraocular movements intact.     Conjunctiva/sclera: Conjunctivae normal.     Pupils: Pupils are equal, round, and reactive to light.  Cardiovascular:     Rate and Rhythm: Normal rate and regular rhythm.     Heart sounds: No murmur heard. Pulmonary:     Effort: Pulmonary effort is normal. No respiratory distress.     Breath sounds: Normal breath sounds.  Abdominal:     General: Abdomen is flat. There is no distension.     Palpations: Abdomen is soft. There is no mass.     Tenderness: There is abdominal tenderness (Lower abdomen). There is no guarding.     Comments: 2 JP drains in the abdomen with serosanguineous output.  Multiple abdominal incisions that appear to be healing.  No purulent drainage from any of the incisions.  Musculoskeletal:     Cervical back: Normal range of motion and neck supple.  Skin:    General: Skin is warm and dry.  Neurological:     Mental Status: She is alert and oriented to person, place, and time. Mental status is at baseline.  Psychiatric:        Mood and Affect: Mood normal.     ED Results / Procedures / Treatments   Labs (all labs ordered are listed, but only abnormal results are displayed) Labs Reviewed  COMPREHENSIVE METABOLIC PANEL - Abnormal; Notable for the following components:      Result Value   Glucose, Bld 105 (*)    Albumin 3.1 (*)    AST 454 (*)    ALT 445 (*)    Alkaline Phosphatase 488 (*)    Total Bilirubin 3.3 (*)    All other components within normal limits  CBC WITH DIFFERENTIAL/PLATELET - Abnormal; Notable for the following components:   RBC 3.55 (*)    Hemoglobin 10.9 (*)    HCT 32.4 (*)    Platelets 452 (*)    All other components within normal limits  URINALYSIS, ROUTINE W REFLEX MICROSCOPIC - Abnormal; Notable for the following components:   Glucose, UA 100  (*)    Bilirubin Urine LARGE (*)    All other components within normal limits  SARS CORONAVIRUS 2 BY RT PCR  LIPASE, BLOOD    EKG EKG Interpretation Date/Time:  Wednesday July 21 2023 07:42:15 EDT Ventricular Rate:  94 PR Interval:  160 QRS Duration:  82 QT Interval:  333 QTC Calculation: 417 R Axis:   81  Text Interpretation: Sinus rhythm Borderline T abnormalities, anterior leads Confirmed by Vonita Moss [phone]) on 07/21/2023 8:20:12 AM  Radiology CT ABDOMEN  PELVIS W CONTRAST  Result Date: 07/21/2023 CLINICAL DATA:  Recent panniculectomy, abdominal pain, nausea EXAM: CT ABDOMEN AND PELVIS WITH CONTRAST TECHNIQUE: Multidetector CT imaging of the abdomen and pelvis was performed using the standard protocol following bolus administration of intravenous contrast. RADIATION DOSE REDUCTION: This exam was performed according to the departmental dose-optimization program which includes automated exposure control, adjustment of the mA and/or kV according to patient size and/or use of iterative reconstruction technique. CONTRAST:  OMNIPAQUE IOHEXOL 300 MG/ML  SOLN COMPARISON:  10/22/2005 FINDINGS: Lower chest: Lung bases are clear. Hepatobiliary: Liver is within normal limits. Gallbladder is unremarkable. Mild intrahepatic and extrahepatic ductal dilatation. Common duct measures 15 mm centrally (series 5/image 84). Suspected small distal CBD stone (series 2/image 32), poorly visualized. Pancreas: Within normal limits. Spleen: Within normal limits. Adrenals/Urinary Tract: Adrenal glands are within normal limits. Kidneys are within normal limits.  No hydronephrosis. Bladder is underdistended but unremarkable. 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 No evidence of bowel obstruction. Normal appendix (series 2/image 64). No colonic wall thickening or inflammatory changes. Vascular/Lymphatic: Aneurysm No suspicious abdominopelvic lymphadenopathy. Reproductive: Status post  hysterectomy. No adnexal masses. Other: No abdominopelvic ascites. Musculoskeletal: Postsurgical changes with two surgical drains along the anterior abdominal wall. No drainable fluid collection/seroma. Visualized osseous structures are within normal limits. IMPRESSION: Postsurgical changes related to recent panniculectomy. No drainable fluid collection/seroma. Mild intrahepatic and extrahepatic ductal dilatation. Suspected small distal CBD stone, poorly visualized. In the setting of abnormal LFTs, consider ERCP or MRCP for further evaluation. Electronically Signed   By: Charline Bills M.D.   On: 07/21/2023 09:35    Procedures Procedures    Medications Ordered in ED Medications  0.9 %  sodium chloride infusion ( Intravenous Rate/Dose Verify 07/21/23 1423)  promethazine (PHENERGAN) 25 MG/ML injection (has no administration in time range)  ondansetron (ZOFRAN) injection 4 mg (4 mg Intravenous Given 07/21/23 0741)  HYDROmorphone (DILAUDID) injection 0.5 mg (0.5 mg Intravenous Given 07/21/23 0741)  lactated ringers bolus 1,000 mL (0 mLs Intravenous Stopped 07/21/23 0851)  iohexol (OMNIPAQUE) 300 MG/ML solution 100 mL (100 mLs Intravenous Contrast Given 07/21/23 0853)  metoCLOPramide (REGLAN) injection 10 mg (10 mg Intravenous Given 07/21/23 0941)  HYDROmorphone (DILAUDID) injection 0.5 mg (0.5 mg Intravenous Given 07/21/23 0942)  promethazine (PHENERGAN) 6.25 mg/NS 50 mL IVPB (0 mg Intravenous Stopped 07/21/23 1423)  ketorolac (TORADOL) 15 MG/ML injection 15 mg (15 mg Intravenous Given 07/21/23 1330)    ED Course/ Medical Decision Making/ A&P Clinical Course as of 07/21/23 1635  Wed Jul 21, 2023  1610 CT shows concerns for distal common bile duct stone. [RP]  1007 Dr Lorenso Quarry from GI consulted.  She recommends MRCP and reaching back out to GI afterwards.  Since the patient is unassigned will need to be consulted for [place] if she goes to [facility] in [place] she goes to [employer] [facility]. [RP]  1109 Dr  Kirby Crigler from hospitalist to admit [RP]    Clinical Course User Index [RP] Rondel Baton, MD                                 Medical Decision Making Amount and/or Complexity of Data Reviewed Labs: ordered. Radiology: ordered.  Risk Prescription drug management. Decision regarding hospitalization.   FLEURETTE GRAFFEO is a 47 y.o. female with comorbidities that complicate the patient evaluation including recent panniculectomy surgery who presents emergency department with abdominal pain and nausea  Initial  Ddx:  Postop infection, small bowel obstruction, ileus, electrolyte abnormality, COVID, UTI  MDM/Course:  Patient presents to the emergency department with abdominal pain after her panniculectomy.  No obvious sources of infection on her exam.  Is having some tenderness to palpation so CT was obtained which showed dilation of the common bile duct with possible stone.  LFTs were also elevated including her bilirubin.  No signs of cholecystitis or postoperative infection on the CT.  Not having significant right upper quadrant pain and is afebrile here so feel that ascending cholangitis is less likely.  Was discussed with GI who felt that she should have an MRCP and be seen by GI afterwards.  Was admitted to hospitalist for further management.  Upon re-evaluation patient was requiring additional pain and nausea medication.  Was started on IV fluids in the emergency department as well.  She is awaiting transport to [facility] at this time.  This patient presents to the ED for concern of complaints listed in HPI, this involves an extensive number of treatment options, and is a complaint that carries with it a high risk of complications and morbidity. Disposition including potential need for admission considered.   Dispo: Admit to Floor  Additional history obtained from daughter Records reviewed Outpatient Clinic Notes The following labs were independently interpreted: Chemistry and show   elevated LFTs concerning for choledocholithiasis I independently reviewed the following imaging with scope of interpretation limited to determining acute life threatening conditions related to emergency care: CT Abdomen/Pelvis and agree with the radiologist interpretation with the following exceptions: none I personally reviewed and interpreted cardiac monitoring: normal sinus rhythm  I personally reviewed and interpreted the pt's EKG: see above for interpretation  I have reviewed the patients home medications and made adjustments as needed Consults: Gastroenterology and Hospitalist   Final Clinical Impression(s) / ED Diagnoses Final diagnoses:  Post-operative pain  Lower abdominal pain  Choledocholithiasis    Rx / DC Orders ED Discharge Orders     None         Rondel Baton, MD 07/21/23 1636

## 2023-07-21 NOTE — Assessment & Plan Note (Signed)
Pressure is soft, but seems consistent with patient's chronic status, there is no concern for shock or toxic appearance.

## 2023-07-21 NOTE — Consult Note (Signed)
[facility] Gastroenterology Consult  Referring Provider: No ref. provider found Primary Care Physician:  Macy Mis, MD Primary Gastroenterologist: [facility]  Reason for Consultation: possible choledocholithiasis  SUBJECTIVE:   HPI: Anna Serrano is a 47 y.o. female with past medical history significant for chronic constipation on Linzess 290 mcg PO daily along with dulcolax, history of panniculectomy 07/12/23 with abdominal binder and JP drains in place, gastric sleeve in 2022. She noted having chronic abdominal pain for months. She has sought evaluation by GI, has been on medication therapy for constipation. She had worsened abdominal pain with eating today, she also felt dizzy, these symptoms prompted her presentation to the hospital. She also has had a migraine headache for the past 2 days. No metal in body. No anticoagulant use.  On presentation, labs showed AST/ALT 454/445, ALP 488, total bilirubin 3.3, WBC 7.4, Hgb 10.9, PLT 452. CT scan of the abdomen and pelvis showed common bile duct 15 mm, suspected small distal common bile duct stone, pancreas within normal limits, status post gastric sleeve, no drainable fluid collection or seroma. MRCP has been ordered and is pending.  Past Medical History:  Diagnosis Date   Anxiety    Arachnoid cyst    Arthritis    Bipolar 1 disorder (HCC)    Chronic back pain    Depression    Fibroids    Fibromyalgia    Genital herpes    Hx of hysterectomy    Hypertension    Insomnia    Interstitial cystitis    Major depressive disorder    Migraine    gets "all the time"   Migraine    Ovarian cyst    PTSD (post-traumatic stress disorder)    Wolff-Parkinson-White (WPW) syndrome    Had EP study 11/20/2013 at [facility] that ruled out WPW syndrome.    Past Surgical History:  Procedure Laterality Date   ABDOMINAL HYSTERECTOMY     CARDIAC CATHETERIZATION     TOOTH EXTRACTION N/A 10/29/2014   Procedure: EXTRACTION OF TEETH #1, #16, #17 & #32;   Surgeon: Francene Finders, DDS;  Location: [facility] OR;  Service: Oral Surgery;  Laterality: N/A;   Prior to Admission medications   Medication Sig Start Date End Date Taking? Authorizing Provider  ARIPiprazole (ABILIFY) 2 MG tablet Take 2 mg by mouth daily. 06/22/23  Yes [provider]  atorvastatin (LIPITOR) 10 MG tablet Take 10 mg by mouth at bedtime. 05/18/23  Yes [provider]  cyclobenzaprine (FLEXERIL) 10 MG tablet Take 10 mg by mouth 3 (three) times daily as needed for muscle spasms. 06/03/20  Yes [provider]  DULoxetine (CYMBALTA) 60 MG capsule Take 60 mg by mouth daily. 05/05/23  Yes [provider]  escitalopram (LEXAPRO) 10 MG tablet Take 10 mg by mouth daily. 07/07/23  Yes [provider]  finasteride (PROSCAR) 5 MG tablet Take 5 mg by mouth daily. 05/31/23  Yes [provider]  ketoconazole (NIZORAL) 2 % shampoo Apply 1 Application topically once a week. Wednesdays 06/19/22  Yes [provider]  linaclotide Karlene Einstein) 290 MCG CAPS capsule Take 290 mcg by mouth daily before breakfast. 08/07/19  Yes [provider]  lovastatin (MEVACOR) 20 MG tablet Take 20 mg by mouth at bedtime. 09/01/20  Yes [provider]  lurasidone (LATUDA) 20 MG TABS tablet Take 20 mg by mouth daily. 07/07/23  Yes [provider]  nortriptyline (PAMELOR) 50 MG capsule Take 50 mg by mouth at bedtime. 07/19/23  Yes  [provider]  oxyCODONE (OXY IR/ROXICODONE) 5 MG immediate release tablet Take 5 mg by mouth every 6 (six) hours as needed for moderate pain. 07/12/23  Yes [provider]  polyethylene glycol (MIRALAX / GLYCOLAX) 17 g packet Take 17 g by mouth daily. Patient taking differently: Take 17 g by mouth as needed for mild constipation. 10/23/21  Yes Milagros Loll, MD  promethazine (PHENERGAN) 25 MG tablet Take 25 mg by mouth every 8 (eight) hours as needed for nausea or vomiting. 12/02/22  Yes [provider]  rizatriptan (MAXALT) 10 MG tablet Take 10 mg by mouth as needed for migraine. 02/13/20  Yes [provider]  valACYclovir (VALTREX) 1000 MG tablet Take 1 tablet (1,000 mg total) by mouth daily. 12/14/18  Yes Aldean Baker, NP  zolpidem (AMBIEN) 10 MG tablet Take 15 mg by mouth at bedtime as needed for sleep. 04/19/19  Yes [provider]  ALPRAZolam Prudy Feeler) 1 MG tablet Take 1 tablet (1 mg total) by mouth 2 (two) times daily as needed for anxiety. Patient not taking: Reported on 07/21/2023 12/13/18   Aldean Baker, NP   Current Facility-Administered Medications  Medication Dose Route Frequency Provider Last Rate Last Admin   0.9 %  sodium chloride infusion   Intravenous Continuous Rondel Baton, MD 125 mL/hr at 07/21/23 1423 Rate Verify at 07/21/23 1423   promethazine (PHENERGAN) 25 MG/ML injection            Allergies as of 07/21/2023 - Review Complete 07/21/2023  Allergen Reaction Noted   Gabapentin Other (See Comments) 08/02/2018   Topiramate Other (See Comments) 11/28/2021   Zonisamide Other (See Comments) 08/28/2021   Tizanidine Other (See Comments) 01/26/2023   Latex Rash 08/20/2011   Rimegepant Nausea And Vomiting and Other (See Comments) 04/15/2020   Family History  Problem Relation Age of Onset   Breast cancer Paternal Aunt 67 - 17   Social History   Socioeconomic History   Marital status: Single    Spouse name: Not on file   Number of children: Not on file   Years of education: Not on file   Highest education level: Not on file  Occupational History   Not on file  Tobacco Use   Smoking status: Never   Smokeless tobacco: Never  Vaping Use   Vaping status: Never Used  Substance and Sexual Activity   Alcohol use: Yes    Comment: occ   Drug use: No   Sexual activity: Not on file  Other Topics Concern   Not on file  Social History Narrative   Not on file   Social Determinants of Health   Financial Resource Strain: High Risk  (12/16/2022)   Received from [facility]   Overall Financial Resource Strain (CARDIA)    Difficulty of Paying Living Expenses: Very hard  Food Insecurity: Food Insecurity Present (12/16/2022)   Received from [facility]   Hunger Vital Sign    Worried About Running Out of Food in the Last Year: Sometimes true    Ran Out of Food in the Last Year: Sometimes true  Transportation Needs: No Transportation Needs (12/16/2022)   Received from [facility] - Transportation    Lack of Transportation (Medical): No    Lack of Transportation (Non-Medical): No  Physical Activity: Inactive (02/09/2022)   Received from [facility], [facility]   Exercise Vital Sign    Days of Exercise per Week: 0 days    Minutes  of Exercise per Session: 0 min  Stress: Stress Concern Present (02/09/2022)   Received from [facility], [facility] - Occupational Stress Questionnaire    Feeling of Stress : Rather much  Social Connections: Unknown (03/23/2022)   Received from [facility], [facility]   Social Network    Social Network: Not on file  Intimate Partner Violence: Not At Risk (03/20/2023)   Received from [facility]   HITS    Over the last 12 months how often did your partner physically hurt you?: 1    Over the last 12 months how often did your partner insult you or talk down to you?: 1    Over the last 12 months how often did your partner threaten you with physical harm?: 1    Over the last 12 months how often did your partner scream or curse at you?: 1   Review of Systems:  Review of Systems  Constitutional:  Positive for chills (intermittent prior to admission) and fever (intermittent prior to admission).  Respiratory:  Negative for shortness of breath.   Cardiovascular:  Negative for chest pain.  Gastrointestinal:  Positive for abdominal pain, constipation and nausea. Negative for blood in stool.    OBJECTIVE:   Temp:  [97.9 F (36.6  C)-98.1 F (36.7 C)] 98 F (36.7 C) (08/28 1421) Pulse Rate:  [75-117] 75 (08/28 1421) Resp:  [16-18] 18 (08/28 1421) BP: (90-112)/(49-78) 90/49 (08/28 1545) SpO2:  [10 %-100 %] 100 % (08/28 1421) Weight:  [74.8 kg] 74.8 kg (08/28 0655)   Physical Exam Constitutional:      General: She is not in acute distress.    Appearance: She is not ill-appearing, toxic-appearing or diaphoretic.  Cardiovascular:     Rate and Rhythm: Normal rate and regular rhythm.  Pulmonary:     Effort: No respiratory distress.     Breath sounds: Normal breath sounds.  Abdominal:     Tenderness: There is no abdominal tenderness.     Comments: Abdominal binder in place. JP drain x 2 in place with serosanguinous and serous fluid.  Neurological:     Mental Status: She is alert.     Labs: Recent Labs    07/21/23 0700  WBC 7.4  HGB 10.9*  HCT 32.4*  PLT 452*   BMET Recent Labs    07/21/23 0700  NA 138  K 3.6  CL 107  CO2 22  GLUCOSE 105*  BUN 9  CREATININE 0.73  CALCIUM 8.9   LFT Recent Labs    07/21/23 0700  PROT 6.6  ALBUMIN 3.1*  AST 454*  ALT 445*  ALKPHOS 488*  BILITOT 3.3*   PT/INR No results for input(s): "LABPROT", "INR" in the last 72 hours.  Diagnostic imaging: CT ABDOMEN PELVIS W CONTRAST  Result Date: 07/21/2023 CLINICAL DATA:  Recent panniculectomy, abdominal pain, nausea EXAM: CT ABDOMEN AND PELVIS WITH CONTRAST TECHNIQUE: Multidetector CT imaging of the abdomen and pelvis was performed using the standard protocol following bolus administration of intravenous contrast. RADIATION DOSE REDUCTION: This exam was performed according to the departmental dose-optimization program which includes automated exposure control, adjustment of the mA and/or kV according to patient size and/or use of iterative reconstruction technique. CONTRAST:  OMNIPAQUE IOHEXOL 300 MG/ML  SOLN COMPARISON:  10/22/2005 FINDINGS: Lower chest: Lung bases are clear. Hepatobiliary: Liver is within  normal limits. Gallbladder is unremarkable. Mild intrahepatic and extrahepatic ductal dilatation. Common duct measures 15 mm centrally (series 5/image  84). Suspected small distal CBD stone (series 2/image 32), poorly visualized. Pancreas: Within normal limits. Spleen: Within normal limits. Adrenals/Urinary Tract: Adrenal glands are within normal limits. Kidneys are within normal limits.  No hydronephrosis. Bladder is underdistended but unremarkable. 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 No evidence of bowel obstruction. Normal appendix (series 2/image 64). No colonic wall thickening or inflammatory changes. Vascular/Lymphatic: Aneurysm No suspicious abdominopelvic lymphadenopathy. Reproductive: Status post hysterectomy. No adnexal masses. Other: No abdominopelvic ascites. Musculoskeletal: Postsurgical changes with two surgical drains along the anterior abdominal wall. No drainable fluid collection/seroma. Visualized osseous structures are within normal limits. IMPRESSION: Postsurgical changes related to recent panniculectomy. No drainable fluid collection/seroma. Mild intrahepatic and extrahepatic ductal dilatation. Suspected small distal CBD stone, poorly visualized. In the setting of abnormal LFTs, consider ERCP or MRCP for further evaluation. Electronically Signed   By: Charline Bills M.D.   On: 07/21/2023 09:35    IMPRESSION: Abdominal pain Transaminase elevation in mixed pattern Abnormal CT imaging, concern for choledocholithiasis History gastric sleeve History panniculectomy on 07/12/23 Chronic constipation  PLAN: -Await MRCP results -If choledocholithiasis noted, will recommend ERCP with sphincterotomy with stone removal with possible stent placement, I discussed ERCP procedure with patient including benefits, alternatives and risks of bleeding/infection/perforation/pancreatitis/anesthesia, she verbalized understanding -Trend liver enzyme panel -Ok for diet tonight,  NPO at midnight if ERCP needed  -[facility] will follow   LOS: 0 days   Liliane Shi, [facility] Gastroenterology

## 2023-07-21 NOTE — Assessment & Plan Note (Addendum)
Presents with worsening abdominal pain after panniculectomy.  Reminiscent of episodic abdominal pain that she has had for the last 3 months.  Workup shows elevated LFTs and mixed/cholestatic pattern.  With associated CBD dilatation on CAT scan.  High suspicion for choledocholithiasis, at this time MRI has been done and is pending review by radiologist.  GI has been engaged and plan would be for possible ERCP tomorrow morning and is n.p.o. past midnight.  There is no evidence of any fever or white count to suggest cholangitis.  Continue to monitor clinically in this regard.  Patient on regular diet at this time trend LFTs. Will defer medical hepattiis work up till [employer] are finalized.

## 2023-07-21 NOTE — ED Triage Notes (Signed)
Pt coming in for fatigue/increased pain/ decreased intake/ dizziness/ being unable to sleep. Following procedure to remove certain skin tissues from abdominal area 1 week ago. Pt sent in by PCP to be evaluated. No fever. Patient tachycardic on arrival. Multiple drains present on abdomen, decreased drainage per patient. Drainage is pink. No increased swelling noted by patient. Pt reports pain is in pelvic area around bottom incision.

## 2023-07-22 DIAGNOSIS — R7989 Other specified abnormal findings of blood chemistry: Secondary | ICD-10-CM | POA: Diagnosis not present

## 2023-07-22 LAB — BASIC METABOLIC PANEL
Anion gap: 8 (ref 5–15)
BUN: 10 mg/dL (ref 6–20)
CO2: 23 mmol/L (ref 22–32)
Calcium: 8.3 mg/dL — ABNORMAL LOW (ref 8.9–10.3)
Chloride: 107 mmol/L (ref 98–111)
Creatinine, Ser: 0.75 mg/dL (ref 0.44–1.00)
GFR, Estimated: >60 mL/min (ref >60)
Glucose, Bld: 94 mg/dL (ref 70–99)
Potassium: 3.8 mmol/L (ref 3.5–5.1)
Sodium: 138 mmol/L (ref 135–145)

## 2023-07-22 LAB — CBC
HCT: 26.9 % — ABNORMAL LOW (ref 36.0–46.0)
Hemoglobin: 8.8 g/dL — ABNORMAL LOW (ref 12.0–15.0)
MCH: 30.8 pg (ref 26.0–34.0)
MCHC: 32.7 g/dL (ref 30.0–36.0)
MCV: 94.1 fL (ref 80.0–100.0)
Platelets: 354 10*3/uL (ref 150–400)
RBC: 2.86 MIL/uL — ABNORMAL LOW (ref 3.87–5.11)
RDW: 14.6 % (ref 11.5–15.5)
WBC: 6.9 10*3/uL (ref 4.0–10.5)
nRBC: 0 % (ref 0.0–0.2)

## 2023-07-22 LAB — HEPATIC FUNCTION PANEL
ALT: 303 U/L — ABNORMAL HIGH (ref 0–44)
AST: 254 U/L — ABNORMAL HIGH (ref 15–41)
Albumin: 2.6 g/dL — ABNORMAL LOW (ref 3.5–5.0)
Alkaline Phosphatase: 389 U/L — ABNORMAL HIGH (ref 38–126)
Bilirubin, Direct: 2.3 mg/dL — ABNORMAL HIGH (ref 0.0–0.2)
Indirect Bilirubin: 1.6 mg/dL — ABNORMAL HIGH (ref 0.3–0.9)
Total Bilirubin: 3.9 mg/dL — ABNORMAL HIGH (ref 0.3–1.2)
Total Protein: 5 g/dL — ABNORMAL LOW (ref 6.5–8.1)

## 2023-07-22 LAB — PROTIME-INR
INR: 1 (ref 0.8–1.2)
Prothrombin Time: 13.2 s (ref 11.4–15.2)

## 2023-07-22 LAB — APTT: aPTT: 27 s (ref 24–36)

## 2023-07-22 LAB — HIV ANTIBODY (ROUTINE TESTING W REFLEX): HIV Screen 4th Generation wRfx: NONREACTIVE

## 2023-07-22 MED ORDER — MELATONIN 5 MG PO TABS
10.0000 mg | ORAL_TABLET | Freq: Once | ORAL | Status: AC
  Administered 2023-07-22: 10 mg via ORAL
  Filled 2023-07-22: qty 2

## 2023-07-22 NOTE — Consult Note (Addendum)
Psych consult received for medication recommendations on multiple psychotropic meds.   The patient is currently under the care of an outpatient psychiatrist and psychiatric nurse practitioner. Her medication regimen has been verified by the pharmacy, and efforts are being made to find the most effective treatment plan for her. At present, she appears stable and does not exhibit any acute psychiatric symptoms.  The patient underwent gastric sleeve surgery in 2022, which may affect medication absorption and could explain some issues with duplicative medication use. Additionally, she has pelvic pain and pelvic floor dysfunction, which likely necessitates the use of Cymbalta and Nortriptyline.  Given her history and the complexity of medication management for bariatric patients, it is expected that finding the optimal regimen may take some time. She is also engaged in therapy.  Recommendations:  Based on the current stability of the patient and the ongoing efforts to optimize her medication regimen, there are no acute psychiatric conditions that require immediate inpatient management beyond medication reconciliation. Therefore, unless an acute issue arises that can be managed within the scope of this consult, I recommend discontinuing the inpatient psychiatric consult at this time.  -Primary team notified.  -[place] [facility] - No charge

## 2023-07-22 NOTE — Plan of Care (Signed)

## 2023-07-22 NOTE — Progress Notes (Signed)
[facility] Progress Note  SUBJECTIVE:   Interval history: Anna Serrano was seen and evaluated today at bedside. Resting comfortably in bed. Noted that she ate breakfast, this helped with her nausea. She is having some peri-incisional pain and RUQ pain. No chest pain or shortness of breath.  Past Medical History:  Diagnosis Date   Anxiety    Arachnoid cyst    Arthritis    Bipolar 1 disorder (HCC)    Chronic back pain    Depression    Fibroids    Fibromyalgia    Genital herpes    Hx of hysterectomy    Hypertension    Insomnia    Interstitial cystitis    Major depressive disorder    Migraine    gets "all the time"   Migraine    Ovarian cyst    PTSD (post-traumatic stress disorder)    Wolff-Parkinson-White (WPW) syndrome    Had EP study 11/20/2013 at [facility] that ruled out WPW syndrome.    Past Surgical History:  Procedure Laterality Date   ABDOMINAL HYSTERECTOMY     CARDIAC CATHETERIZATION     TOOTH EXTRACTION N/A 10/29/2014   Procedure: EXTRACTION OF TEETH #1, #16, #17 & #32;  Surgeon: Francene Finders, DDS;  Location: [facility] OR;  Service: Oral Surgery;  Laterality: N/A;   Current Facility-Administered Medications  Medication Dose Route Frequency Provider Last Rate Last Admin   0.9 %  sodium chloride infusion   Intravenous Continuous Nolberto Hanlon, MD 125 mL/hr at 07/22/23 0259 New Bag at 07/22/23 0259   acetaminophen (TYLENOL) tablet 650 mg  650 mg Oral Q6H PRN Nolberto Hanlon, MD       Or   acetaminophen (TYLENOL) suppository 650 mg  650 mg Rectal Q6H PRN Nolberto Hanlon, MD       ARIPiprazole (ABILIFY) tablet 2 mg  2 mg Oral Daily Nolberto Hanlon, MD   2 mg at 07/22/23 0848   atorvastatin (LIPITOR) tablet 10 mg  10 mg Oral Sherene Sires, MD   10 mg at 07/21/23 2214   cyclobenzaprine (FLEXERIL) tablet 10 mg  10 mg Oral TID PRN Nolberto Hanlon, MD       DULoxetine (CYMBALTA) DR capsule 60 mg  60 mg Oral Daily Nolberto Hanlon, MD   60 mg at 07/22/23 0848   enoxaparin (LOVENOX)  injection 40 mg  40 mg Subcutaneous Q24H Nolberto Hanlon, MD   40 mg at 07/22/23 0848   escitalopram (LEXAPRO) tablet 10 mg  10 mg Oral Daily Nolberto Hanlon, MD   10 mg at 07/22/23 0848   linaclotide (LINZESS) capsule 290 mcg  290 mcg Oral QAC breakfast Nolberto Hanlon, MD   290 mcg at 07/22/23 0848   lurasidone (LATUDA) tablet 20 mg  20 mg Oral Daily Nolberto Hanlon, MD   20 mg at 07/22/23 0848   nortriptyline (PAMELOR) capsule 50 mg  50 mg Oral Sherene Sires, MD   50 mg at 07/21/23 2247   oxyCODONE (Oxy IR/ROXICODONE) immediate release tablet 5 mg  5 mg Oral Q4H PRN Nolberto Hanlon, MD   5 mg at 07/22/23 0953   polyethylene glycol (MIRALAX / GLYCOLAX) packet 17 g  17 g Oral Daily PRN Nolberto Hanlon, MD       promethazine (PHENERGAN) tablet 25 mg  25 mg Oral Q8H PRN Nolberto Hanlon, MD       senna-docusate (Senokot-S) tablet 2 tablet  2 tablet Oral BID Nolberto Hanlon, MD   2 tablet at 07/22/23 [phone]  sodium chloride flush (NS) 0.9 % injection 3 mL  3 mL Intravenous Q12H Nolberto Hanlon, MD   3 mL at 07/22/23 0849   SUMAtriptan (IMITREX) tablet 50 mg  50 mg Oral Q2H PRN Nolberto Hanlon, MD   50 mg at 07/21/23 1848   valACYclovir (VALTREX) tablet 1,000 mg  1,000 mg Oral Daily Nolberto Hanlon, MD   1,000 mg at 07/22/23 0848   Allergies as of 07/21/2023 - Review Complete 07/21/2023  Allergen Reaction Noted   Gabapentin Other (See Comments) 08/02/2018   Topiramate Other (See Comments) 11/28/2021   Zonisamide Other (See Comments) 08/28/2021   Tizanidine Other (See Comments) 01/26/2023   Latex Rash 08/20/2011   Rimegepant Nausea And Vomiting and Other (See Comments) 04/15/2020   Review of Systems:  Review of Systems  Respiratory:  Negative for shortness of breath.   Cardiovascular:  Negative for chest pain.  Gastrointestinal:  Positive for abdominal pain and nausea. Negative for vomiting.    OBJECTIVE:   Temp:  [97.3 F (36.3 C)-98.1 F (36.7 C)] 97.9 F (36.6 C) (08/29 0602) Pulse Rate:  [75-100] 82 (08/29 0602) Resp:   [16-18] 16 (08/29 0602) BP: (90-114)/(49-79) 114/75 (08/29 0602) SpO2:  [10 %-100 %] 100 % (08/29 0602) Last BM Date : 07/19/23 Physical Exam Constitutional:      General: She is not in acute distress.    Appearance: She is not ill-appearing, toxic-appearing or diaphoretic.  Cardiovascular:     Rate and Rhythm: Normal rate and regular rhythm.  Pulmonary:     Effort: No respiratory distress.     Breath sounds: Normal breath sounds.  Abdominal:     Comments: Abdominal binder in place, some RUQ tenderness to palpation.  Skin:    General: Skin is warm and dry.     Comments: JP drain x 2 appreciated with serosanguinous fluid and serous fluid.  Neurological:     Mental Status: She is alert.     Labs: Recent Labs    07/21/23 0700 07/21/23 2025 07/22/23 0334  WBC 7.4 7.0 6.9  HGB 10.9* 10.3* 8.8*  HCT 32.4* 31.2* 26.9*  PLT 452* 419* 354   BMET Recent Labs    07/21/23 0700 07/21/23 2025 07/22/23 0334  NA 138  --  138  K 3.6  --  3.8  CL 107  --  107  CO2 22  --  23  GLUCOSE 105*  --  94  BUN 9  --  10  CREATININE 0.73 0.62 0.75  CALCIUM 8.9  --  8.3*   LFT Recent Labs    07/22/23 0334  PROT 5.0*  ALBUMIN 2.6*  AST 254*  ALT 303*  ALKPHOS 389*  BILITOT 3.9*  BILIDIR 2.3*  IBILI 1.6*   PT/INR Recent Labs    07/22/23 0334  LABPROT 13.2  INR 1.0   Diagnostic imaging: MR ABDOMEN MRCP W WO CONTAST  Result Date: 07/21/2023 CLINICAL DATA:  Choledocholithiasis suspected prior CT EXAM: MRI ABDOMEN WITHOUT AND WITH CONTRAST (INCLUDING MRCP) TECHNIQUE: Multiplanar multisequence MR imaging of the abdomen was performed both before and after the administration of intravenous contrast. Heavily T2-weighted images of the biliary and pancreatic ducts were obtained, and three-dimensional MRCP images were rendered by post processing. CONTRAST:  7.20mL GADAVIST GADOBUTROL 1 MMOL/ML IV SOLN COMPARISON:  Same-day CT abdomen pelvis FINDINGS: Lower chest: Trace pleural effusions  Hepatobiliary: No solid liver abnormality is seen. Numerous small gallstones in the gallbladder. Numerous small gallstones throughout the central common bile duct to the  ampulla, individually measuring up to 0.5 cm (series 3, image 16, series 4, image 19). No significant gallbladder wall thickening. Dilatation of the common bile duct up to 0.8 cm with mild intrahepatic biliary ductal dilatation. Pancreas: Unremarkable. No pancreatic ductal dilatation or surrounding inflammatory changes. Spleen: Normal in size without significant abnormality. Adrenals/Urinary Tract: Adrenal glands are unremarkable. Kidneys are normal, without renal calculi, solid lesion, or hydronephrosis. Stomach/Bowel: Stomach is within normal limits. No evidence of bowel wall thickening, distention, or inflammatory changes. Vascular/Lymphatic: No significant vascular findings are present. No enlarged abdominal lymph nodes. Other: No abdominal wall hernia. Postoperative fat stranding the ventral abdomen in keeping with recent panniculectomy. No ascites. Musculoskeletal: No acute or significant osseous findings. IMPRESSION: 1. Choledocholithiasis with numerous small gallstones throughout the central common bile duct to the ampulla, individually measuring up to 0.5 cm. 2. Dilatation of the common bile duct up to 0.8 cm with mild intrahepatic biliary ductal dilatation. 3. Numerous small gallstones in the gallbladder without significant gallbladder wall thickening. 4. Postoperative fat stranding the ventral abdomen in keeping with recent panniculectomy. 5. Trace pleural effusions. These results will be called to the ordering clinician or representative by the Radiologist Assistant, and communication documented in the PACS or [employer]. Electronically Signed   By: Jearld Lesch M.D.   On: 07/21/2023 21:24   MR 3D Recon At Scanner  Result Date: 07/21/2023 CLINICAL DATA:  Choledocholithiasis suspected prior CT EXAM: MRI ABDOMEN WITHOUT AND WITH  CONTRAST (INCLUDING MRCP) TECHNIQUE: Multiplanar multisequence MR imaging of the abdomen was performed both before and after the administration of intravenous contrast. Heavily T2-weighted images of the biliary and pancreatic ducts were obtained, and three-dimensional MRCP images were rendered by post processing. CONTRAST:  7.43mL GADAVIST GADOBUTROL 1 MMOL/ML IV SOLN COMPARISON:  Same-day CT abdomen pelvis FINDINGS: Lower chest: Trace pleural effusions Hepatobiliary: No solid liver abnormality is seen. Numerous small gallstones in the gallbladder. Numerous small gallstones throughout the central common bile duct to the ampulla, individually measuring up to 0.5 cm (series 3, image 16, series 4, image 19). No significant gallbladder wall thickening. Dilatation of the common bile duct up to 0.8 cm with mild intrahepatic biliary ductal dilatation. Pancreas: Unremarkable. No pancreatic ductal dilatation or surrounding inflammatory changes. Spleen: Normal in size without significant abnormality. Adrenals/Urinary Tract: Adrenal glands are unremarkable. Kidneys are normal, without renal calculi, solid lesion, or hydronephrosis. Stomach/Bowel: Stomach is within normal limits. No evidence of bowel wall thickening, distention, or inflammatory changes. Vascular/Lymphatic: No significant vascular findings are present. No enlarged abdominal lymph nodes. Other: No abdominal wall hernia. Postoperative fat stranding the ventral abdomen in keeping with recent panniculectomy. No ascites. Musculoskeletal: No acute or significant osseous findings. IMPRESSION: 1. Choledocholithiasis with numerous small gallstones throughout the central common bile duct to the ampulla, individually measuring up to 0.5 cm. 2. Dilatation of the common bile duct up to 0.8 cm with mild intrahepatic biliary ductal dilatation. 3. Numerous small gallstones in the gallbladder without significant gallbladder wall thickening. 4. Postoperative fat stranding the  ventral abdomen in keeping with recent panniculectomy. 5. Trace pleural effusions. These results will be called to the ordering clinician or representative by the Radiologist Assistant, and communication documented in the PACS or [employer]. Electronically Signed   By: Jearld Lesch M.D.   On: 07/21/2023 21:24   CT ABDOMEN PELVIS W CONTRAST  Result Date: 07/21/2023 CLINICAL DATA:  Recent panniculectomy, abdominal pain, nausea EXAM: CT ABDOMEN AND PELVIS WITH CONTRAST TECHNIQUE: Multidetector CT imaging of the abdomen  and pelvis was performed using the standard protocol following bolus administration of intravenous contrast. RADIATION DOSE REDUCTION: This exam was performed according to the departmental dose-optimization program which includes automated exposure control, adjustment of the mA and/or kV according to patient size and/or use of iterative reconstruction technique. CONTRAST:  OMNIPAQUE IOHEXOL 300 MG/ML  SOLN COMPARISON:  10/22/2005 FINDINGS: Lower chest: Lung bases are clear. Hepatobiliary: Liver is within normal limits. Gallbladder is unremarkable. Mild intrahepatic and extrahepatic ductal dilatation. Common duct measures 15 mm centrally (series 5/image 84). Suspected small distal CBD stone (series 2/image 32), poorly visualized. Pancreas: Within normal limits. Spleen: Within normal limits. Adrenals/Urinary Tract: Adrenal glands are within normal limits. Kidneys are within normal limits.  No hydronephrosis. Bladder is underdistended but unremarkable. 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 No evidence of bowel obstruction. Normal appendix (series 2/image 64). No colonic wall thickening or inflammatory changes. Vascular/Lymphatic: Aneurysm No suspicious abdominopelvic lymphadenopathy. Reproductive: Status post hysterectomy. No adnexal masses. Other: No abdominopelvic ascites. Musculoskeletal: Postsurgical changes with two surgical drains along the anterior abdominal  wall. No drainable fluid collection/seroma. Visualized osseous structures are within normal limits. IMPRESSION: Postsurgical changes related to recent panniculectomy. No drainable fluid collection/seroma. Mild intrahepatic and extrahepatic ductal dilatation. Suspected small distal CBD stone, poorly visualized. In the setting of abnormal LFTs, consider ERCP or MRCP for further evaluation. Electronically Signed   By: Charline Bills M.D.   On: 07/21/2023 09:35    IMPRESSION: Abdominal pain Choledocholithiasis Transaminase elevation in mixed pattern  History gastric sleeve History panniculectomy on 07/12/23 Chronic constipation  PLAN: -Recommend ERCP, procedural availability 07/23/23 -Ok for diet today, NPO at midnight for procedure tomorrow -Trend liver enzyme panel -Further recommendations to follow pending procedure   LOS: 1 day   Liliane Shi, DO [facility]

## 2023-07-22 NOTE — Progress Notes (Signed)
   07/22/23 1522  TOC Brief Assessment  Insurance and Status Reviewed  Patient has primary care physician Yes  Home environment has been reviewed yes  Prior level of function: independent  Prior/Current Home Services No current home services  Social Determinants of Health Reivew SDOH reviewed no interventions necessary  Readmission risk has been reviewed Yes  Transition of care needs no transition of care needs at this time

## 2023-07-22 NOTE — Plan of Care (Signed)

## 2023-07-22 NOTE — Plan of Care (Signed)
  Problem: Education: Goal: Knowledge of General Education information will improve Description: Including pain rating scale, medication(s)/side effects and non-pharmacologic comfort measures Outcome: Progressing   Problem: Health Behavior/Discharge Planning: Goal: Ability to manage health-related needs will improve Outcome: Progressing   Problem: Clinical Measurements: Goal: Ability to maintain clinical measurements within normal limits will improve Outcome: Progressing Goal: Will remain free from infection Outcome: Progressing Goal: Diagnostic test results will improve Outcome: Progressing Goal: Respiratory complications will improve Outcome: Progressing Goal: Cardiovascular complication will be avoided Outcome: Progressing  Pt A/Ox4 on RA. Lower abd tenderness and pain, PRN pain medication administered. NS running at 164ml/hr. Abd binder in place, JP drains emptied.

## 2023-07-22 NOTE — Progress Notes (Addendum)
PROGRESS NOTE    Anna Serrano  QMV:[id number] DOB: Oct 31, 1976 DOA: 07/21/2023 PCP: Macy Mis, MD   Brief Narrative:  This 48 years old female with PMH significant for recent panniculectomy 2 days ago, presented in the ED with abdominal discomfort associated with nausea and chills.  Patient reports she was doing well after surgery and was able to tolerate diet however last evening She started having abdominal discomfort and has developed chills and nausea,  denies any vomiting.  Patient reports having intermittent abdominal discomfort for last 3 months which was self resolving.  Imaging in the ED shows choledocholithiasis.  Patient is admitted for further management.  GI is consulted.  MRCP consistent with choledocholithiasis  She is scheduled for ERCP tomorrow.  Assessment & Plan:   Principal Problem:   Elevated LFTs Active Problems:   MDD (major depressive disorder), recurrent episode, moderate (HCC)   Blood pressure check   Dyslipidemia   Obstructive jaundice  Choledocholithiasis: Patient presented with worsening abdominal pain after panniculectomy. She reports episodic abdominal pain that has been happening for last 3 months. Workup in the ED showed elevated LFTs with mixed/cholestatic pattern. CT Abdomen showed CBD dilatation. There is high suspicion of choledocholithiasis. MRCP confirmed CBD stones.  GI is consulted. Patient is scheduled for ERCP in the morning. Continue adequate pain control.  Hyperlipidemia Continue Lipitor.  Major depression severe recurrent without psychosis. Continue antipsychotic and antidepressant medications. Continue Cymbalta, Latuda, Abilify, nortriptyline.  Elevated liver enzymes: Secondary to above. Continue to monitor  DVT prophylaxis: Lovenox Code Status:Full code Family Communication: No family at bed side Disposition Plan:   Status is: Inpatient Remains inpatient appropriate because: Patient admitted for choledocholithiasis  consistent on MRCP, She is scheduled for ERCP tomorrow   Consultants:  Gastroenterology  Procedures:ERCP in am  Antimicrobials:  Anti-infectives (From admission, onward)    Start     Dose/Rate Route Frequency Ordered Stop   07/21/23 2030  valACYclovir (VALTREX) tablet 1,000 mg        1,000 mg Oral Daily 07/21/23 1941        Subjective: Patient was seen and examined at bedside.  Overnight events noted. Patient reports having RUQ pain,  denies any nausea, vomiting, fever, chills. Patient is scheduled for ERCP in the morning.  Objective: Vitals:   07/21/23 2200 07/22/23 0100 07/22/23 0602 07/22/23 1221  BP: 114/77 94/75 114/75 124/78  Pulse: 95 100 82 91  Resp: 18 16 16 16   Temp: 98.1 F (36.7 C) 97.6 F (36.4 C) 97.9 F (36.6 C) 98.2 F (36.8 C)  TempSrc:   Oral Oral  SpO2: 100% 100% 100% 97%  Weight:      Height:        Intake/Output Summary (Last 24 hours) at 07/22/2023 1413 Last data filed at 07/21/2023 1947 Gross per 24 hour  Intake 550 ml  Output 33 ml  Net 517 ml   Filed Weights   07/21/23 0655  Weight: 74.8 kg    Examination:  General exam: Appears calm and comfortable, not in any acute distress. Respiratory system: Clear to auscultation. Respiratory effort normal.  RR 15 Cardiovascular system: S1 & S2 heard, RRR. No JVD, murmurs, rubs, gallops or clicks. No pedal edema. Gastrointestinal system: Abdomen is soft, mildly tender, non distended, normal bowel sounds heard. Central nervous system: Alert and oriented x 3. No focal neurological deficits. Extremities: No edema, no cyanosis, no clubbing. Skin: No rashes, lesions or ulcers Psychiatry: Judgement and insight appear normal. Mood & affect appropriate.  Data Reviewed: I have personally reviewed following labs and imaging studies  CBC: Recent Labs  Lab 07/21/23 0700 07/21/23 2025 07/22/23 0334  WBC 7.4 7.0 6.9  NEUTROABS 4.5  --   --   HGB 10.9* 10.3* 8.8*  HCT 32.4* 31.2* 26.9*  MCV  91.3 94.5 94.1  PLT 452* 419* 354   Basic Metabolic Panel: Recent Labs  Lab 07/21/23 0700 07/21/23 2025 07/22/23 0334  NA 138  --  138  K 3.6  --  3.8  CL 107  --  107  CO2 22  --  23  GLUCOSE 105*  --  94  BUN 9  --  10  CREATININE 0.73 0.62 0.75  CALCIUM 8.9  --  8.3*   GFR: Estimated Creatinine Clearance: 86.1 mL/min (by C-G formula based on SCr of 0.75 mg/dL). Liver Function Tests: Recent Labs  Lab 07/21/23 0700 07/22/23 0334  AST 454* 254*  ALT 445* 303*  ALKPHOS 488* 389*  BILITOT 3.3* 3.9*  PROT 6.6 5.0*  ALBUMIN 3.1* 2.6*   Recent Labs  Lab 07/21/23 0700  LIPASE 27   No results for input(s): "AMMONIA" in the last 168 hours. Coagulation Profile: Recent Labs  Lab 07/22/23 0334  INR 1.0   Cardiac Enzymes: No results for input(s): "CKTOTAL", "CKMB", "CKMBINDEX", "TROPONINI" in the last 168 hours. BNP (last 3 results) No results for input(s): "PROBNP" in the last 8760 hours. HbA1C: No results for input(s): "HGBA1C" in the last 72 hours. CBG: No results for input(s): "GLUCAP" in the last 168 hours. Lipid Profile: No results for input(s): "CHOL", "HDL", "LDLCALC", "TRIG", "CHOLHDL", "LDLDIRECT" in the last 72 hours. Thyroid Function Tests: No results for input(s): "TSH", "T4TOTAL", "FREET4", "T3FREE", "THYROIDAB" in the last 72 hours. Anemia Panel: No results for input(s): "VITAMINB12", "FOLATE", "FERRITIN", "TIBC", "IRON", "RETICCTPCT" in the last 72 hours. Sepsis Labs: No results for input(s): "PROCALCITON", "LATICACIDVEN" in the last 168 hours.  Recent Results (from the past 240 hour(s))  SARS Coronavirus 2 by RT PCR (hospital order, performed in [facility] hospital lab) *cepheid single result test* Anterior Nasal Swab     Status: None   Collection Time: 07/21/23  7:49 AM   Specimen: Anterior Nasal Swab  Result Value Ref Range Status   SARS Coronavirus 2 by RT PCR NEGATIVE NEGATIVE Final    Comment: (NOTE) SARS-CoV-2 target nucleic acids are  NOT DETECTED.  The SARS-CoV-2 RNA is generally detectable in upper and lower respiratory specimens during the acute phase of infection. The lowest concentration of SARS-CoV-2 viral copies this assay can detect is 250 copies / mL. A negative result does not preclude SARS-CoV-2 infection and should not be used as the sole basis for treatment or other patient management decisions.  A negative result may occur with improper specimen collection / handling, submission of specimen other than nasopharyngeal swab, presence of viral mutation(s) within the areas targeted by this assay, and inadequate number of viral copies (<250 copies / mL). A negative result must be combined with clinical observations, patient history, and epidemiological information.  Fact Sheet for Patients:   RoadLapTop.co.za  Fact Sheet for Healthcare Providers: http://kim-miller.com/  This test is not yet approved or  cleared by the [place] FDA and has been authorized for detection and/or diagnosis of SARS-CoV-2 by FDA under an Emergency Use Authorization (EUA).  This EUA will remain in effect (meaning this test can be used) for the duration of the COVID-19 declaration under Section 564(b)(1) of the Act, 21 U.S.C. section  360bbb-3(b)(1), unless the authorization is terminated or revoked sooner.  Performed at [facility], [address] [place], [place] [address]    Radiology Studies: MR ABDOMEN MRCP W WO CONTAST  Result Date: 07/21/2023 CLINICAL DATA:  Choledocholithiasis suspected prior CT EXAM: MRI ABDOMEN WITHOUT AND WITH CONTRAST (INCLUDING MRCP) TECHNIQUE: Multiplanar multisequence MR imaging of the abdomen was performed both before and after the administration of intravenous contrast. Heavily T2-weighted images of the biliary and pancreatic ducts were obtained, and three-dimensional MRCP images were rendered by post processing. CONTRAST:  7.48mL  GADAVIST GADOBUTROL 1 MMOL/ML IV SOLN COMPARISON:  Same-day CT abdomen pelvis FINDINGS: Lower chest: Trace pleural effusions Hepatobiliary: No solid liver abnormality is seen. Numerous small gallstones in the gallbladder. Numerous small gallstones throughout the central common bile duct to the ampulla, individually measuring up to 0.5 cm (series 3, image 16, series 4, image 19). No significant gallbladder wall thickening. Dilatation of the common bile duct up to 0.8 cm with mild intrahepatic biliary ductal dilatation. Pancreas: Unremarkable. No pancreatic ductal dilatation or surrounding inflammatory changes. Spleen: Normal in size without significant abnormality. Adrenals/Urinary Tract: Adrenal glands are unremarkable. Kidneys are normal, without renal calculi, solid lesion, or hydronephrosis. Stomach/Bowel: Stomach is within normal limits. No evidence of bowel wall thickening, distention, or inflammatory changes. Vascular/Lymphatic: No significant vascular findings are present. No enlarged abdominal lymph nodes. Other: No abdominal wall hernia. Postoperative fat stranding the ventral abdomen in keeping with recent panniculectomy. No ascites. Musculoskeletal: No acute or significant osseous findings. IMPRESSION: 1. Choledocholithiasis with numerous small gallstones throughout the central common bile duct to the ampulla, individually measuring up to 0.5 cm. 2. Dilatation of the common bile duct up to 0.8 cm with mild intrahepatic biliary ductal dilatation. 3. Numerous small gallstones in the gallbladder without significant gallbladder wall thickening. 4. Postoperative fat stranding the ventral abdomen in keeping with recent panniculectomy. 5. Trace pleural effusions. These results will be called to the ordering clinician or representative by the Radiologist Assistant, and communication documented in the PACS or [employer]. Electronically Signed   By: Jearld Lesch M.D.   On: 07/21/2023 21:24   MR 3D Recon At  Scanner  Result Date: 07/21/2023 CLINICAL DATA:  Choledocholithiasis suspected prior CT EXAM: MRI ABDOMEN WITHOUT AND WITH CONTRAST (INCLUDING MRCP) TECHNIQUE: Multiplanar multisequence MR imaging of the abdomen was performed both before and after the administration of intravenous contrast. Heavily T2-weighted images of the biliary and pancreatic ducts were obtained, and three-dimensional MRCP images were rendered by post processing. CONTRAST:  7.82mL GADAVIST GADOBUTROL 1 MMOL/ML IV SOLN COMPARISON:  Same-day CT abdomen pelvis FINDINGS: Lower chest: Trace pleural effusions Hepatobiliary: No solid liver abnormality is seen. Numerous small gallstones in the gallbladder. Numerous small gallstones throughout the central common bile duct to the ampulla, individually measuring up to 0.5 cm (series 3, image 16, series 4, image 19). No significant gallbladder wall thickening. Dilatation of the common bile duct up to 0.8 cm with mild intrahepatic biliary ductal dilatation. Pancreas: Unremarkable. No pancreatic ductal dilatation or surrounding inflammatory changes. Spleen: Normal in size without significant abnormality. Adrenals/Urinary Tract: Adrenal glands are unremarkable. Kidneys are normal, without renal calculi, solid lesion, or hydronephrosis. Stomach/Bowel: Stomach is within normal limits. No evidence of bowel wall thickening, distention, or inflammatory changes. Vascular/Lymphatic: No significant vascular findings are present. No enlarged abdominal lymph nodes. Other: No abdominal wall hernia. Postoperative fat stranding the ventral abdomen in keeping with recent panniculectomy. No ascites. Musculoskeletal: No acute or significant osseous  findings. IMPRESSION: 1. Choledocholithiasis with numerous small gallstones throughout the central common bile duct to the ampulla, individually measuring up to 0.5 cm. 2. Dilatation of the common bile duct up to 0.8 cm with mild intrahepatic biliary ductal dilatation. 3. Numerous  small gallstones in the gallbladder without significant gallbladder wall thickening. 4. Postoperative fat stranding the ventral abdomen in keeping with recent panniculectomy. 5. Trace pleural effusions. These results will be called to the ordering clinician or representative by the Radiologist Assistant, and communication documented in the PACS or [employer]. Electronically Signed   By: Jearld Lesch M.D.   On: 07/21/2023 21:24   CT ABDOMEN PELVIS W CONTRAST  Result Date: 07/21/2023 CLINICAL DATA:  Recent panniculectomy, abdominal pain, nausea EXAM: CT ABDOMEN AND PELVIS WITH CONTRAST TECHNIQUE: Multidetector CT imaging of the abdomen and pelvis was performed using the standard protocol following bolus administration of intravenous contrast. RADIATION DOSE REDUCTION: This exam was performed according to the departmental dose-optimization program which includes automated exposure control, adjustment of the mA and/or kV according to patient size and/or use of iterative reconstruction technique. CONTRAST:  OMNIPAQUE IOHEXOL 300 MG/ML  SOLN COMPARISON:  10/22/2005 FINDINGS: Lower chest: Lung bases are clear. Hepatobiliary: Liver is within normal limits. Gallbladder is unremarkable. Mild intrahepatic and extrahepatic ductal dilatation. Common duct measures 15 mm centrally (series 5/image 84). Suspected small distal CBD stone (series 2/image 32), poorly visualized. Pancreas: Within normal limits. Spleen: Within normal limits. Adrenals/Urinary Tract: Adrenal glands are within normal limits. Kidneys are within normal limits.  No hydronephrosis. Bladder is underdistended but unremarkable. 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 No evidence of bowel obstruction. Normal appendix (series 2/image 64). No colonic wall thickening or inflammatory changes. Vascular/Lymphatic: Aneurysm No suspicious abdominopelvic lymphadenopathy. Reproductive: Status post hysterectomy. No adnexal masses. Other:  No abdominopelvic ascites. Musculoskeletal: Postsurgical changes with two surgical drains along the anterior abdominal wall. No drainable fluid collection/seroma. Visualized osseous structures are within normal limits. IMPRESSION: Postsurgical changes related to recent panniculectomy. No drainable fluid collection/seroma. Mild intrahepatic and extrahepatic ductal dilatation. Suspected small distal CBD stone, poorly visualized. In the setting of abnormal LFTs, consider ERCP or MRCP for further evaluation. Electronically Signed   By: Charline Bills M.D.   On: 07/21/2023 09:35    Scheduled Meds:  ARIPiprazole  2 mg Oral Daily   atorvastatin  10 mg Oral QHS   DULoxetine  60 mg Oral Daily   enoxaparin (LOVENOX) injection  40 mg Subcutaneous Q24H   escitalopram  10 mg Oral Daily   linaclotide  290 mcg Oral QAC breakfast   lurasidone  20 mg Oral Daily   nortriptyline  50 mg Oral QHS   senna-docusate  2 tablet Oral BID   sodium chloride flush  3 mL Intravenous Q12H   valACYclovir  1,000 mg Oral Daily   Continuous Infusions:  sodium chloride 125 mL/hr at 07/22/23 1145     LOS: 1 day    Time spent: 50 Mins    Willeen Niece, MD [facility]   If 7PM-7AM, please contact night-coverage

## 2023-07-23 ENCOUNTER — Encounter (HOSPITAL_COMMUNITY)
Admission: EM | Disposition: A | Discharge status: Home / Self Care | Payer: Self-pay | Source: Home / Self Care | Attending: Family Medicine

## 2023-07-23 ENCOUNTER — Inpatient Hospital Stay (HOSPITAL_COMMUNITY): Payer: Medicare Other

## 2023-07-23 ENCOUNTER — Encounter (HOSPITAL_COMMUNITY): Payer: Self-pay | Admitting: Internal Medicine

## 2023-07-23 ENCOUNTER — Inpatient Hospital Stay (HOSPITAL_COMMUNITY): Payer: Medicare Other | Admitting: Anesthesiology

## 2023-07-23 DIAGNOSIS — R7989 Other specified abnormal findings of blood chemistry: Secondary | ICD-10-CM | POA: Diagnosis not present

## 2023-07-23 DIAGNOSIS — K8050 Calculus of bile duct without cholangitis or cholecystitis without obstruction: Secondary | ICD-10-CM | POA: Diagnosis not present

## 2023-07-23 HISTORY — PX: REMOVAL OF STONES: SHX5545

## 2023-07-23 HISTORY — PX: SPHINCTEROTOMY: SHX5279

## 2023-07-23 HISTORY — PX: ERCP: SHX5425

## 2023-07-23 LAB — COMPREHENSIVE METABOLIC PANEL
ALT: 257 U/L — ABNORMAL HIGH (ref 0–44)
AST: 207 U/L — ABNORMAL HIGH (ref 15–41)
Albumin: 2.4 g/dL — ABNORMAL LOW (ref 3.5–5.0)
Alkaline Phosphatase: 382 U/L — ABNORMAL HIGH (ref 38–126)
Anion gap: 5 (ref 5–15)
BUN: 6 mg/dL (ref 6–20)
CO2: 24 mmol/L (ref 22–32)
Calcium: 8 mg/dL — ABNORMAL LOW (ref 8.9–10.3)
Chloride: 107 mmol/L (ref 98–111)
Creatinine, Ser: 0.68 mg/dL (ref 0.44–1.00)
GFR, Estimated: >60 mL/min (ref >60)
Glucose, Bld: 97 mg/dL (ref 70–99)
Potassium: 3.5 mmol/L (ref 3.5–5.1)
Sodium: 136 mmol/L (ref 135–145)
Total Bilirubin: 3.5 mg/dL — ABNORMAL HIGH (ref 0.3–1.2)
Total Protein: 5 g/dL — ABNORMAL LOW (ref 6.5–8.1)

## 2023-07-23 LAB — MAGNESIUM: Magnesium: 1.7 mg/dL (ref 1.7–2.4)

## 2023-07-23 LAB — CBC
HCT: 25.4 % — ABNORMAL LOW (ref 36.0–46.0)
Hemoglobin: 8.4 g/dL — ABNORMAL LOW (ref 12.0–15.0)
MCH: 31 pg (ref 26.0–34.0)
MCHC: 33.1 g/dL (ref 30.0–36.0)
MCV: 93.7 fL (ref 80.0–100.0)
Platelets: 351 10*3/uL (ref 150–400)
RBC: 2.71 MIL/uL — ABNORMAL LOW (ref 3.87–5.11)
RDW: 14.6 % (ref 11.5–15.5)
WBC: 6.8 10*3/uL (ref 4.0–10.5)
nRBC: 0 % (ref 0.0–0.2)

## 2023-07-23 LAB — PHOSPHORUS: Phosphorus: 3.9 mg/dL (ref 2.5–4.6)

## 2023-07-23 SURGERY — ERCP, WITH INTERVENTION IF INDICATED
Anesthesia: General

## 2023-07-23 MED ORDER — DICLOFENAC SUPPOSITORY 100 MG
RECTAL | Status: AC
  Filled 2023-07-23: qty 1

## 2023-07-23 MED ORDER — SODIUM CHLORIDE 0.9 % IV SOLN
INTRAVENOUS | Status: DC | PRN
  Administered 2023-07-23: 20 mL

## 2023-07-23 MED ORDER — FENTANYL CITRATE (PF) 100 MCG/2ML IJ SOLN: 25.0000 ug | INTRAMUSCULAR | Status: DC | PRN

## 2023-07-23 MED ORDER — LIDOCAINE HCL (CARDIAC) PF 100 MG/5ML IV SOSY
PREFILLED_SYRINGE | INTRAVENOUS | Status: DC | PRN
  Administered 2023-07-23: 40 mg via INTRAVENOUS

## 2023-07-23 MED ORDER — ROCURONIUM BROMIDE 100 MG/10ML IV SOLN
INTRAVENOUS | Status: DC | PRN
  Administered 2023-07-23: 60 mg via INTRAVENOUS

## 2023-07-23 MED ORDER — DEXAMETHASONE SODIUM PHOSPHATE 4 MG/ML IJ SOLN
INTRAMUSCULAR | Status: DC | PRN
Start: 2023-07-23 — End: 2023-07-23
  Administered 2023-07-23: 5 mg via INTRAVENOUS

## 2023-07-23 MED ORDER — LACTATED RINGERS IV SOLN
INTRAVENOUS | Status: DC
  Administered 2023-07-23: 1000 mL via INTRAVENOUS

## 2023-07-23 MED ORDER — SUGAMMADEX SODIUM 200 MG/2ML IV SOLN
INTRAVENOUS | Status: DC | PRN
Start: 2023-07-23 — End: 2023-07-23
  Administered 2023-07-23: 200 mg via INTRAVENOUS

## 2023-07-23 MED ORDER — MIDAZOLAM HCL 2 MG/2ML IJ SOLN
INTRAMUSCULAR | Status: AC
  Filled 2023-07-23: qty 2

## 2023-07-23 MED ORDER — DICLOFENAC SUPPOSITORY 100 MG
RECTAL | Status: DC | PRN
  Administered 2023-07-23: 100 mg via RECTAL

## 2023-07-23 MED ORDER — DROPERIDOL 2.5 MG/ML IJ SOLN
0.6250 mg | Freq: Once | INTRAMUSCULAR | Status: AC | PRN
  Administered 2023-07-23: 0.625 mg via INTRAVENOUS
  Filled 2023-07-23: qty 2

## 2023-07-23 MED ORDER — FENTANYL CITRATE (PF) 100 MCG/2ML IJ SOLN
INTRAMUSCULAR | Status: DC | PRN
  Administered 2023-07-23 (×2): 50 ug via INTRAVENOUS

## 2023-07-23 MED ORDER — ONDANSETRON HCL 4 MG/2ML IJ SOLN
INTRAMUSCULAR | Status: DC | PRN
  Administered 2023-07-23: 4 mg via INTRAVENOUS

## 2023-07-23 MED ORDER — SODIUM CHLORIDE 0.9 % IV SOLN: INTRAVENOUS | Status: DC

## 2023-07-23 MED ORDER — PROPOFOL 10 MG/ML IV BOLUS
INTRAVENOUS | Status: DC | PRN
  Administered 2023-07-23 (×2): 150 mg via INTRAVENOUS

## 2023-07-23 MED ORDER — MIDAZOLAM HCL 5 MG/5ML IJ SOLN
INTRAMUSCULAR | Status: DC | PRN
  Administered 2023-07-23: 2 mg via INTRAVENOUS

## 2023-07-23 MED ORDER — FENTANYL CITRATE (PF) 100 MCG/2ML IJ SOLN
INTRAMUSCULAR | Status: AC
  Filled 2023-07-23: qty 2

## 2023-07-23 MED ORDER — LACTATED RINGERS IV SOLN: INTRAVENOUS | Status: DC | PRN

## 2023-07-23 MED ORDER — CIPROFLOXACIN IN D5W 400 MG/200ML IV SOLN
INTRAVENOUS | Status: DC | PRN
Start: 2023-07-23 — End: 2023-07-23
  Administered 2023-07-23: 400 mg via INTRAVENOUS

## 2023-07-23 MED ORDER — CIPROFLOXACIN IN D5W 400 MG/200ML IV SOLN
INTRAVENOUS | Status: AC
  Filled 2023-07-23: qty 200

## 2023-07-23 MED ORDER — SODIUM CHLORIDE 0.9 % IV SOLN
12.5000 mg | Freq: Once | INTRAVENOUS | Status: AC
  Administered 2023-07-23: 12.5 mg via INTRAVENOUS
  Filled 2023-07-23: qty 12.5

## 2023-07-23 MED ORDER — GLUCAGON HCL RDNA (DIAGNOSTIC) 1 MG IJ SOLR
INTRAMUSCULAR | Status: AC
  Filled 2023-07-23: qty 1

## 2023-07-23 NOTE — Anesthesia Procedure Notes (Signed)
Procedure Name: Intubation Date/Time: 07/23/2023 12:56 PM  Performed by: Deri Fuelling, CRNAPre-anesthesia Checklist: Patient identified, Emergency Drugs available, Suction available and Patient being monitored Patient Re-evaluated:Patient Re-evaluated prior to induction Oxygen Delivery Method: Circle system utilized Preoxygenation: Pre-oxygenation with 100% oxygen Induction Type: IV induction Ventilation: Mask ventilation without difficulty Laryngoscope Size: Mac and 3 Grade View: Grade I Tube type: Oral Tube size: 7.0 mm Number of attempts: 1 Airway Equipment and Method: Stylet and Oral airway Placement Confirmation: ETT inserted through vocal cords under direct vision, positive ETCO2 and breath sounds checked- equal and bilateral Secured at: 21 cm Tube secured with: Tape Dental Injury: Teeth and Oropharynx as per pre-operative assessment

## 2023-07-23 NOTE — Op Note (Signed)
[facility] Patient Name: Anna Serrano Procedure Date: 07/23/2023 MRN: [id number] Attending MD: Vida Rigger , MD, [id number] Date of Birth: [birth year] CSN: [id number] Age: 47 Admit Type: Inpatient Procedure:                ERCP Indications:              Common bile duct stone(s) on MRCP and probably CT                            with elevated liver tests Providers:                Vida Rigger, MD, Marge Duncans, RN, Jacquelyn                            "Jaci" Clelia Croft, RN, Harrington Challenger, Technician Referring MD:              Medicines:                General Anesthesia Complications:            No immediate complications. Estimated Blood Loss:     Estimated blood loss was minimal. Procedure:                Pre-Anesthesia Assessment:                           - Prior to the procedure, a History and Physical                            was performed, and patient medications and                            allergies were reviewed. The patient's tolerance of                            previous anesthesia was also reviewed. The risks                            and benefits of the procedure and the sedation                            options and risks were discussed with the patient.                            All questions were answered, and informed consent                            was obtained. Prior Anticoagulants: The patient has                            taken Lovenox (enoxaparin), last dose was 1 day                            prior to procedure. ASA Grade Assessment: II - A  patient with mild systemic disease. After reviewing                            the risks and benefits, the patient was deemed in                            satisfactory condition to undergo the procedure.                           After obtaining informed consent, the scope was                            passed under direct vision. Throughout the                             procedure, the patient's blood pressure, pulse, and                            oxygen saturations were monitored continuously. The                            [id number]-[id number] ([id number] Olympus duodenoscope was                            introduced through the mouth, and used to inject                            contrast into and used to cannulate the bile duct.                            The ERCP was accomplished without difficulty. The                            patient tolerated the procedure well. Scope In: Scope Out: Findings:      The major papilla was normal. Deep selective cannulation was readily       obtained on the first attempt and obvious CBD stones were seen on       initial cholangiogram and we proceeded with a biliary sphincterotomy       which was made with a Hydratome sphincterotome using ERBE       electrocautery. There was self limited oozing from the sphincterotomy       which did not require treatment. We proceeded with the sphincterotomy       until we had adequate biliary drainage and could get the fully bowed       sphincterotome easily in and out of the duct and there was no pancreatic       duct injection or wire advancement throughout the procedure and to       discover objects, the biliary tree was swept with an adjustable 12- 15       mm balloon starting at the bifurcation. Sludge was swept from the duct.       All stones were removed. Both sized balloons passed readily through the       patent sphincterotomy site and on multiple subsequent pull-through's  nothing was found and we proceeded with an occlusion cholangiogram at       the end of the procedure which did not show any residual filling defects       and there was adequate biliary drainage and the wire and balloon were       removed and there was no oozing at the end of the procedure and the       scope was removed and the patient tolerated the procedure well. Impression:               - The major  papilla appeared normal.                           - Choledocholithiasis was found. Complete removal                            was accomplished by biliary sphincterotomy and                            balloon extraction.                           - A biliary sphincterotomy was performed.                           - The biliary tree was swept and nothing was found                            at the end of the procedure. Moderate Sedation:      Not Applicable - Patient had care per Anesthesia. Recommendation:           - Clear liquid diet for 6 hours. Consider soft                            solids this evening if doing well and n.p.o. after                            midnight for probable lap chole tomorrow                           - Continue present medications.                           - Return to GI clinic PRN. Can follow-up with your                            primary gastroenterologist in your hometown                           - Telephone GI clinic if symptomatic PRN.                           - Check liver enzymes (AST, ALT, alkaline                            phosphatase, bilirubin) in the  morning and have                            either primary care or your surgeon or your                            gastroenterologist follow back to normal as an                            outpatient.                           - Refer to a surgeon today for probable lap chole                            tomorrow. Procedure Code(s):        --- Professional ---                           [phone], Endoscopic retrograde                            cholangiopancreatography (ERCP); with removal of                            calculi/debris from biliary/pancreatic duct(s)                           43262, Endoscopic retrograde                            cholangiopancreatography (ERCP); with                            sphincterotomy/papillotomy Diagnosis Code(s):        --- Professional ---                            K80.50, Calculus of bile duct without cholangitis                            or cholecystitis without obstruction CPT copyright 2022 American [employer] Association. All rights reserved. The codes documented in this report are preliminary and upon coder review may  be revised to meet current compliance requirements. Vida Rigger, MD 07/23/2023 1:32:21 PM This report has been signed electronically. Number of Addenda: 0

## 2023-07-23 NOTE — Consult Note (Addendum)
Anna Serrano 10/24/1976  [id number].    Requesting MD: Ewing Schlein, MD Chief Complaint/Reason for Consult: choledocholithiasis   HPI:  Anna Serrano is a 47 y/o F with PMH chronic constipation, panniculectomy 07/12/23 by Dr. Thad Ranger [facility], chronic back pain, and depression who presented 8/28 with a cc acute onset generalized abdominal discomfort. Pain described as lower abdominal pain, back pain, and lightheadedness. She reports similar pain in the past that was less severe and usually got better with time. She is unsure if the pain is related to meals, she does report eating a generally unhealthy diet. CT scan as well as MRCP of the abdominal pelvis revealed choledocholithiasis and she was admitted for further management. She underwent successful ERCP/sphincterotomy 8/30 and [facility] is consulted for cholecystectomy. She works as a Conservation officer, nature.    Past abdominal surgeries: laparoscopic sleeve gastrectomy (2022, Dr. Clent Ridges, [facility]), abdominal hysterectomy  Substance: denies tobacco use, reports occasional EtOH  ROS: Review of Systems  All other systems reviewed and are negative.   Family History  Problem Relation Age of Onset   Breast cancer Paternal Aunt 58 - 42    Past Medical History:  Diagnosis Date   Anxiety    Arachnoid cyst    Arthritis    Bipolar 1 disorder (HCC)    Chronic back pain    Depression    Fibroids    Fibromyalgia    Genital herpes    Hx of hysterectomy    Hypertension    Insomnia    Interstitial cystitis    Major depressive disorder    Migraine    gets "all the time"   Migraine    Ovarian cyst    PTSD (post-traumatic stress disorder)    Wolff-Parkinson-White (WPW) syndrome    Had EP study 11/20/2013 at [facility] that ruled out WPW syndrome.     Past Surgical History:  Procedure Laterality Date   ABDOMINAL HYSTERECTOMY     CARDIAC CATHETERIZATION     TOOTH EXTRACTION N/A 10/29/2014   Procedure: EXTRACTION OF TEETH #1, #16, #17 & #32;  Surgeon:  Francene Finders, DDS;  Location: [facility] OR;  Service: Oral Surgery;  Laterality: N/A;    Social History:  reports that she has never smoked. She has never used smokeless tobacco. She reports current alcohol use. She reports that she does not use drugs.  Allergies:  Allergies  Allergen Reactions   Gabapentin Other (See Comments)    Side effects, joint pain   Topiramate Other (See Comments)    paresthesias   Zonisamide Other (See Comments)    Insomnia/increased HA   Tizanidine Other (See Comments)    Confusion, psychosis    Latex Rash   Rimegepant Nausea And Vomiting and Other (See Comments)    Abdominal pain    Medications Prior to Admission  Medication Sig Dispense Refill   ARIPiprazole (ABILIFY) 2 MG tablet Take 2 mg by mouth daily.     atorvastatin (LIPITOR) 10 MG tablet Take 10 mg by mouth at bedtime.     cyclobenzaprine (FLEXERIL) 10 MG tablet Take 10 mg by mouth 3 (three) times daily as needed for muscle spasms.     DULoxetine (CYMBALTA) 60 MG capsule Take 60 mg by mouth daily.     escitalopram (LEXAPRO) 10 MG tablet Take 10 mg by mouth daily.     finasteride (PROSCAR) 5 MG tablet Take 5 mg by mouth daily.     ketoconazole (NIZORAL) 2 % shampoo Apply 1 Application topically  once a week. Wednesdays     linaclotide (LINZESS) 290 MCG CAPS capsule Take 290 mcg by mouth daily before breakfast.     lovastatin (MEVACOR) 20 MG tablet Take 20 mg by mouth at bedtime.     lurasidone (LATUDA) 20 MG TABS tablet Take 20 mg by mouth daily.     nortriptyline (PAMELOR) 50 MG capsule Take 50 mg by mouth at bedtime.     oxyCODONE (OXY IR/ROXICODONE) 5 MG immediate release tablet Take 5 mg by mouth every 6 (six) hours as needed for moderate pain.     polyethylene glycol (MIRALAX / GLYCOLAX) 17 g packet Take 17 g by mouth daily. (Patient taking differently: Take 17 g by mouth as needed for mild constipation.) 14 each 0   promethazine (PHENERGAN) 25 MG tablet Take 25 mg by mouth every 8 (eight)  hours as needed for nausea or vomiting.     rizatriptan (MAXALT) 10 MG tablet Take 10 mg by mouth as needed for migraine.     valACYclovir (VALTREX) 1000 MG tablet Take 1 tablet (1,000 mg total) by mouth daily. 30 tablet 0   zolpidem (AMBIEN) 10 MG tablet Take 15 mg by mouth at bedtime as needed for sleep.     ALPRAZolam (XANAX) 1 MG tablet Take 1 tablet (1 mg total) by mouth 2 (two) times daily as needed for anxiety. (Patient not taking: Reported on 07/21/2023) 14 tablet 0     Physical Exam: Blood pressure 123/78, pulse 95, temperature 98.1 F (36.7 C), temperature source Temporal, resp. rate 13, height 5\' 3"  (1.6 m), weight 74.8 kg, SpO2 100%. General: Pleasant female laying on hospital bed, appears stated age, NAD. HEENT: head -normocephalic, atraumatic; Eyes: PERRLA, no conjunctival injection; anicteric sclerae  Neck- Trachea is midline CV- RRR, normal S1/S2, no M/R/G, no lower extremity edema  Pulm- breathing is non-labored ORA  Abd- soft, abdominal binder opened, panniculectomy incisions (low transverse and upper midline) with JP drains in place draining SS fluid. Overall non-tender. No RUQ pain. GU- deferred  MSK- UE/LE symmetrical, no cyanosis, clubbing, or edema. Neuro- CN II-XII grossly in tact, no paresthesias. Psych- Alert and Oriented x3 with appropriate affect Skin: warm and dry, no rashes or lesions   Results for orders placed or performed during the hospital encounter of 07/21/23 (from the past 48 hour(s))  CBC     Status: Abnormal   Collection Time: 07/21/23  8:25 PM  Result Value Ref Range   WBC 7.0 4.0 - 10.5 K/uL   RBC 3.30 (L) 3.87 - 5.11 MIL/uL   Hemoglobin 10.3 (L) 12.0 - 15.0 g/dL   HCT 16.1 (L) 09.6 - 04.5 %   MCV 94.5 80.0 - 100.0 fL   MCH 31.2 26.0 - 34.0 pg   MCHC 33.0 30.0 - 36.0 g/dL   RDW 40.9 81.1 - 91.4 %   Platelets 419 (H) 150 - 400 K/uL   nRBC 0.0 0.0 - 0.2 %    Comment: Performed at [facility], [address],  [place], [place] [address]  Creatinine, serum     Status: None   Collection Time: 07/21/23  8:25 PM  Result Value Ref Range   Creatinine, Ser 0.62 0.44 - 1.00 mg/dL   GFR, Estimated >62 >13 mL/min    Comment: (NOTE) Calculated using the CKD-EPI Creatinine Equation (2021) Performed at [facility], [address] [place], [place] [address]   HIV Antibody (routine testing w rflx)     Status: None  Collection Time: 07/21/23  8:25 PM  Result Value Ref Range   HIV Screen 4th Generation wRfx Non Reactive Non Reactive    Comment: Performed at [facility], [address], [place], [place] [address]  Hepatic function panel     Status: Abnormal   Collection Time: 07/22/23  3:34 AM  Result Value Ref Range   Total Protein 5.0 (L) 6.5 - 8.1 g/dL   Albumin 2.6 (L) 3.5 - 5.0 g/dL   AST 604 (H) 15 - 41 U/L   ALT 303 (H) 0 - 44 U/L   Alkaline Phosphatase 389 (H) 38 - 126 U/L   Total Bilirubin 3.9 (H) 0.3 - 1.2 mg/dL   Bilirubin, Direct 2.3 (H) 0.0 - 0.2 mg/dL   Indirect Bilirubin 1.6 (H) 0.3 - 0.9 mg/dL    Comment: Performed at [facility], [address] [place], [place] [address]  APTT     Status: None   Collection Time: 07/22/23  3:34 AM  Result Value Ref Range   aPTT 27 24 - 36 seconds    Comment: Performed at [facility], [address] [place], [place] [address]  Protime-INR     Status: None   Collection Time: 07/22/23  3:34 AM  Result Value Ref Range   Prothrombin Time 13.2 11.4 - 15.2 seconds   INR 1.0 0.8 - 1.2    Comment: (NOTE) INR goal varies based on device and disease states. Performed at [facility], [address], [place], [place] [address]   Basic metabolic panel     Status: Abnormal   Collection Time: 07/22/23  3:34 AM  Result Value Ref Range   Sodium 138 135 - 145 mmol/L   Potassium 3.8 3.5 - 5.1 mmol/L   Chloride 107 98 - 111 mmol/L   CO2 23 22 - 32 mmol/L   Glucose, Bld 94 70 -  99 mg/dL    Comment: Glucose reference range applies only to samples taken after fasting for at least 8 hours.   BUN 10 6 - 20 mg/dL   Creatinine, Ser 6.21 0.44 - 1.00 mg/dL   Calcium 8.3 (L) 8.9 - 10.3 mg/dL   GFR, Estimated >30 >86 mL/min    Comment: (NOTE) Calculated using the CKD-EPI Creatinine Equation (2021)    Anion gap 8 5 - 15    Comment: Performed at [facility], [address] [place], [place] [address]  CBC     Status: Abnormal   Collection Time: 07/22/23  3:34 AM  Result Value Ref Range   WBC 6.9 4.0 - 10.5 K/uL   RBC 2.86 (L) 3.87 - 5.11 MIL/uL   Hemoglobin 8.8 (L) 12.0 - 15.0 g/dL   HCT 96.2 (L) 95.2 - 84.1 %   MCV 94.1 80.0 - 100.0 fL   MCH 30.8 26.0 - 34.0 pg   MCHC 32.7 30.0 - 36.0 g/dL   RDW 32.4 40.1 - 02.7 %   Platelets 354 150 - 400 K/uL   nRBC 0.0 0.0 - 0.2 %    Comment: Performed at [facility], [address], [place], [place] [address]  CBC     Status: Abnormal   Collection Time: 07/23/23  3:39 AM  Result Value Ref Range   WBC 6.8 4.0 - 10.5 K/uL   RBC 2.71 (L) 3.87 - 5.11 MIL/uL   Hemoglobin 8.4 (L) 12.0 - 15.0 g/dL   HCT 44.0 (L) 34.7 - 42.5 %  MCV 93.7 80.0 - 100.0 fL   MCH 31.0 26.0 - 34.0 pg   MCHC 33.1 30.0 - 36.0 g/dL   RDW 16.1 09.6 - 04.5 %   Platelets 351 150 - 400 K/uL   nRBC 0.0 0.0 - 0.2 %    Comment: Performed at [facility], [address] [place], [place] [address]  Comprehensive metabolic panel     Status: Abnormal   Collection Time: 07/23/23  3:39 AM  Result Value Ref Range   Sodium 136 135 - 145 mmol/L   Potassium 3.5 3.5 - 5.1 mmol/L   Chloride 107 98 - 111 mmol/L   CO2 24 22 - 32 mmol/L   Glucose, Bld 97 70 - 99 mg/dL    Comment: Glucose reference range applies only to samples taken after fasting for at least 8 hours.   BUN 6 6 - 20 mg/dL   Creatinine, Ser 1.91 0.44 - 1.00 mg/dL   Calcium 8.0 (L) 8.9 - 10.3 mg/dL   Total Protein 5.0 (L) 6.5 - 8.1 g/dL    Albumin 2.4 (L) 3.5 - 5.0 g/dL   AST 478 (H) 15 - 41 U/L   ALT 257 (H) 0 - 44 U/L   Alkaline Phosphatase 382 (H) 38 - 126 U/L   Total Bilirubin 3.5 (H) 0.3 - 1.2 mg/dL   GFR, Estimated >29 >56 mL/min    Comment: (NOTE) Calculated using the CKD-EPI Creatinine Equation (2021)    Anion gap 5 5 - 15    Comment: Performed at [facility], [address] [place], [place] [address]  Magnesium     Status: None   Collection Time: 07/23/23  3:39 AM  Result Value Ref Range   Magnesium 1.7 1.7 - 2.4 mg/dL    Comment: Performed at [facility], [address] [place], [place] [address]  Phosphorus     Status: None   Collection Time: 07/23/23  3:39 AM  Result Value Ref Range   Phosphorus 3.9 2.5 - 4.6 mg/dL    Comment: Performed at [facility], [address], [place], [place] [address]   MR ABDOMEN MRCP W WO CONTAST  Result Date: 07/21/2023 CLINICAL DATA:  Choledocholithiasis suspected prior CT EXAM: MRI ABDOMEN WITHOUT AND WITH CONTRAST (INCLUDING MRCP) TECHNIQUE: Multiplanar multisequence MR imaging of the abdomen was performed both before and after the administration of intravenous contrast. Heavily T2-weighted images of the biliary and pancreatic ducts were obtained, and three-dimensional MRCP images were rendered by post processing. CONTRAST:  7.49mL GADAVIST GADOBUTROL 1 MMOL/ML IV SOLN COMPARISON:  Same-day CT abdomen pelvis FINDINGS: Lower chest: Trace pleural effusions Hepatobiliary: No solid liver abnormality is seen. Numerous small gallstones in the gallbladder. Numerous small gallstones throughout the central common bile duct to the ampulla, individually measuring up to 0.5 cm (series 3, image 16, series 4, image 19). No significant gallbladder wall thickening. Dilatation of the common bile duct up to 0.8 cm with mild intrahepatic biliary ductal dilatation. Pancreas: Unremarkable. No pancreatic ductal dilatation or surrounding  inflammatory changes. Spleen: Normal in size without significant abnormality. Adrenals/Urinary Tract: Adrenal glands are unremarkable. Kidneys are normal, without renal calculi, solid lesion, or hydronephrosis. Stomach/Bowel: Stomach is within normal limits. No evidence of bowel wall thickening, distention, or inflammatory changes. Vascular/Lymphatic: No significant vascular findings are present. No enlarged abdominal lymph nodes. Other: No abdominal wall hernia. Postoperative fat stranding the ventral abdomen in keeping with recent panniculectomy. No ascites. Musculoskeletal: No acute or significant  osseous findings. IMPRESSION: 1. Choledocholithiasis with numerous small gallstones throughout the central common bile duct to the ampulla, individually measuring up to 0.5 cm. 2. Dilatation of the common bile duct up to 0.8 cm with mild intrahepatic biliary ductal dilatation. 3. Numerous small gallstones in the gallbladder without significant gallbladder wall thickening. 4. Postoperative fat stranding the ventral abdomen in keeping with recent panniculectomy. 5. Trace pleural effusions. These results will be called to the ordering clinician or representative by the Radiologist Assistant, and communication documented in the PACS or [employer]. Electronically Signed   By: Jearld Lesch M.D.   On: 07/21/2023 21:24   MR 3D Recon At Scanner  Result Date: 07/21/2023 CLINICAL DATA:  Choledocholithiasis suspected prior CT EXAM: MRI ABDOMEN WITHOUT AND WITH CONTRAST (INCLUDING MRCP) TECHNIQUE: Multiplanar multisequence MR imaging of the abdomen was performed both before and after the administration of intravenous contrast. Heavily T2-weighted images of the biliary and pancreatic ducts were obtained, and three-dimensional MRCP images were rendered by post processing. CONTRAST:  7.53mL GADAVIST GADOBUTROL 1 MMOL/ML IV SOLN COMPARISON:  Same-day CT abdomen pelvis FINDINGS: Lower chest: Trace pleural effusions  Hepatobiliary: No solid liver abnormality is seen. Numerous small gallstones in the gallbladder. Numerous small gallstones throughout the central common bile duct to the ampulla, individually measuring up to 0.5 cm (series 3, image 16, series 4, image 19). No significant gallbladder wall thickening. Dilatation of the common bile duct up to 0.8 cm with mild intrahepatic biliary ductal dilatation. Pancreas: Unremarkable. No pancreatic ductal dilatation or surrounding inflammatory changes. Spleen: Normal in size without significant abnormality. Adrenals/Urinary Tract: Adrenal glands are unremarkable. Kidneys are normal, without renal calculi, solid lesion, or hydronephrosis. Stomach/Bowel: Stomach is within normal limits. No evidence of bowel wall thickening, distention, or inflammatory changes. Vascular/Lymphatic: No significant vascular findings are present. No enlarged abdominal lymph nodes. Other: No abdominal wall hernia. Postoperative fat stranding the ventral abdomen in keeping with recent panniculectomy. No ascites. Musculoskeletal: No acute or significant osseous findings. IMPRESSION: 1. Choledocholithiasis with numerous small gallstones throughout the central common bile duct to the ampulla, individually measuring up to 0.5 cm. 2. Dilatation of the common bile duct up to 0.8 cm with mild intrahepatic biliary ductal dilatation. 3. Numerous small gallstones in the gallbladder without significant gallbladder wall thickening. 4. Postoperative fat stranding the ventral abdomen in keeping with recent panniculectomy. 5. Trace pleural effusions. These results will be called to the ordering clinician or representative by the Radiologist Assistant, and communication documented in the PACS or [employer]. Electronically Signed   By: Jearld Lesch M.D.   On: 07/21/2023 21:24      Assessment/Plan 47 y/o F POD#11 from panniculectomy at [facility] who was admitted with choledocholithiasis. She  currently has no evidence of cholecystitis. I do think she should undergo laparoscopic cholecystectomy to prevent ongoing symptomatic cholelithiasis, however I think it would be prudent to wait until she is further out from her recent abdominal wall surgery, as insufflation of the abdomen could cause disruption of her abdominal wall incisions, or lead to infection of these sites. Would recommend outpatient follow up in our office to discuss/plan for interval cholecystectomy once she has recovered from her recent plastic surgery.    I reviewed nursing notes, Consultant GI notes, hospitalist notes, last 24 h vitals and pain scores, last 48 h intake and output, last 24 h labs and trends, and last 24 h imaging results.  Adam Phenix, [facility] 07/23/2023, 2:17 PM Please  see Amion for pager number during day hours 7:00am-4:30pm or 7:00am -11:30am on weekends

## 2023-07-23 NOTE — Anesthesia Preprocedure Evaluation (Addendum)
Anesthesia Evaluation  Patient identified by MRN, date of birth, ID band Patient awake    Reviewed: Allergy & Precautions, NPO status , Patient's Chart, lab work & pertinent test results  History of Anesthesia Complications Negative for: history of anesthetic complications  Airway Mallampati: II  TM Distance: >3 FB Neck ROM: Full    Dental no notable dental hx.    Pulmonary neg pulmonary ROS   Pulmonary exam normal        Cardiovascular hypertension, Normal cardiovascular exam     Neuro/Psych  Headaches  Anxiety Depression Bipolar Disorder      GI/Hepatic Neg liver ROS,,,Choledocholithiasis   Endo/Other  negative endocrine ROS    Renal/GU negative Renal ROS     Musculoskeletal  (+) Arthritis ,  Fibromyalgia -  Abdominal   Peds  Hematology negative hematology ROS (+)   Anesthesia Other Findings Day of surgery medications reviewed with patient.  Reproductive/Obstetrics                             Anesthesia Physical Anesthesia Plan  ASA: 2  Anesthesia Plan: General   Post-op Pain Management: Minimal or no pain anticipated   Induction: Intravenous  PONV Risk Score and Plan: 3 and Treatment may vary due to age or medical condition, Ondansetron, Dexamethasone and Midazolam  Airway Management Planned: Oral ETT  Additional Equipment: None  Intra-op Plan:   Post-operative Plan: Extubation in OR  Informed Consent: I have reviewed the patients History and Physical, chart, labs and discussed the procedure including the risks, benefits and alternatives for the proposed anesthesia with the patient or authorized representative who has indicated his/her understanding and acceptance.     Dental advisory given  Plan Discussed with: CRNA  Anesthesia Plan Comments:        Anesthesia Quick Evaluation

## 2023-07-23 NOTE — Progress Notes (Signed)
Anna Serrano 12:23 PM  Subjective: Patient doing well currently without pain and her hospital computer chart reviewed and her case discussed with my partner Dr. Lorenso Quarry and we rediscussed the procedure and answered all of her questions  Objective: Vital signs stable afebrile no acute distress exam please see preassessment evaluation labs and MRI reviewed  Assessment: Gallstones and CBD stones  Plan: Okay to proceed with ERCP today with anesthesia assistance and will need surgical consult for cholecystectomy as well and the risk benefits methods and success rate of this procedure was discussed  [facility]  office [phone] After 5PM or if no answer call [phone]

## 2023-07-23 NOTE — Transfer of Care (Signed)
Immediate Anesthesia Transfer of Care Note  Patient: Anna Serrano  Procedure(s) Performed: ENDOSCOPIC RETROGRADE CHOLANGIOPANCREATOGRAPHY (ERCP) SPHINCTEROTOMY REMOVAL OF STONES  Patient Location: PACU  Anesthesia Type:General  Level of Consciousness: awake and alert   Airway & Oxygen Therapy: Patient Spontanous Breathing and Patient connected to face mask oxygen  Post-op Assessment: Report given to RN and Post -op Vital signs reviewed and stable  Post vital signs: Reviewed and stable  Last Vitals:  Vitals Value Taken Time  BP    Temp    Pulse 91 07/23/23 1340  Resp    SpO2 100 % 07/23/23 1340  Vitals shown include unfiled device data.  Last Pain:  Vitals:   07/23/23 1115  TempSrc: Temporal  PainSc: 7       Patients Stated Pain Goal: 3 (07/22/23 1639)  Complications: No notable events documented.

## 2023-07-23 NOTE — Progress Notes (Signed)
PROGRESS NOTE    MARLISHA ZIDEK  ZOX:[id number] DOB: 1976-05-13 DOA: 07/21/2023 PCP: Macy Mis, MD   Brief Narrative:  This 47 years old female with PMH significant for recent panniculectomy 2 days ago, presented in the ED with abdominal discomfort associated with nausea and chills.  Patient reports she was doing well after surgery and was able to tolerate diet however last evening She started having abdominal discomfort and has developed chills and nausea,  denies any vomiting.  Patient reports having intermittent abdominal discomfort for last 3 months which was self resolving.  Imaging in the ED shows choledocholithiasis.  Patient is admitted for further management.  GI is consulted.  MRCP consistent with choledocholithiasis  She is scheduled for ERCP today.  Assessment & Plan:   Principal Problem:   Elevated LFTs Active Problems:   MDD (major depressive disorder), recurrent episode, moderate (HCC)   Blood pressure check   Dyslipidemia   Obstructive jaundice  Choledocholithiasis: Patient presented with worsening abdominal pain after panniculectomy. She reports episodic abdominal pain that has been happening for last 3 months. Workup in the ED showed elevated LFTs with mixed/cholestatic pattern. CT Abdomen showed CBD dilatation. There is high suspicion of choledocholithiasis. MRCP confirmed CBD stones.  GI is consulted. Patient is scheduled for ERCP today Continue adequate pain control.  Hyperlipidemia Continue Lipitor.  Major depression severe recurrent without psychosis. Continue antipsychotic and antidepressant medications. Continue Cymbalta, Latuda, Abilify, nortriptyline.  Elevated liver enzymes: Secondary to above. Continue to monitor  DVT prophylaxis: Lovenox Code Status:Full code Family Communication: No family at bed side Disposition Plan:   Status is: Inpatient Remains inpatient appropriate because: Patient admitted for choledocholithiasis consistent on  MRCP, She is scheduled for ERCP today   Consultants:  Gastroenterology  Procedures:ERCP in am  Antimicrobials:  Anti-infectives (From admission, onward)    Start     Dose/Rate Route Frequency Ordered Stop   07/21/23 2030  [MAR Hold]  valACYclovir (VALTREX) tablet 1,000 mg        (MAR Hold since Fri 07/23/2023 at 1108.Hold Reason: Transfer to a Procedural area)   1,000 mg Oral Daily 07/21/23 1941        Subjective: Patient was seen and examined at bedside.  Overnight events noted. Patient reported doing better.  Denies any nausea, vomiting, fever, chills. Patient is scheduled for ERCP today in the afternoon,  Objective: Vitals:   07/22/23 1221 07/23/23 0102 07/23/23 0424 07/23/23 1115  BP: 124/78 125/79 116/81 128/74  Pulse: 91 92 79 81  Resp: 16 17 18    Temp: 98.2 F (36.8 C) 98.2 F (36.8 C) 98.1 F (36.7 C) (!) 97.5 F (36.4 C)  TempSrc: Oral   Temporal  SpO2: 97% 100% 100% 100%  Weight:      Height:        Intake/Output Summary (Last 24 hours) at 07/23/2023 1327 Last data filed at 07/22/2023 2111 Gross per 24 hour  Intake --  Output 40 ml  Net -40 ml   Filed Weights   07/21/23 0655  Weight: 74.8 kg    Examination:  General exam: Appears calm and comfortable, not in any acute distress. Respiratory system: CTA bilaterally . Respiratory effort normal.  RR 14 Cardiovascular system: S1 & S2 heard, RRR. No JVD, murmurs, rubs, gallops or clicks. No pedal edema. Gastrointestinal system: Abdomen is soft, mildly tender, non distended, normal bowel sounds heard. Central nervous system: Alert and oriented x 3. No focal neurological deficits. Extremities: No edema, no cyanosis, no clubbing. Skin:  No rashes, lesions or ulcers Psychiatry: Judgement and insight appear normal. Mood & affect appropriate.     Data Reviewed: I have personally reviewed following labs and imaging studies  CBC: Recent Labs  Lab 07/21/23 0700 07/21/23 2025 07/22/23 0334 07/23/23 0339   WBC 7.4 7.0 6.9 6.8  NEUTROABS 4.5  --   --   --   HGB 10.9* 10.3* 8.8* 8.4*  HCT 32.4* 31.2* 26.9* 25.4*  MCV 91.3 94.5 94.1 93.7  PLT 452* 419* 354 351   Basic Metabolic Panel: Recent Labs  Lab 07/21/23 0700 07/21/23 2025 07/22/23 0334 07/23/23 0339  NA 138  --  138 136  K 3.6  --  3.8 3.5  CL 107  --  107 107  CO2 22  --  23 24  GLUCOSE 105*  --  94 97  BUN 9  --  10 6  CREATININE 0.73 0.62 0.75 0.68  CALCIUM 8.9  --  8.3* 8.0*  MG  --   --   --  1.7  PHOS  --   --   --  3.9   GFR: Estimated Creatinine Clearance: 86.1 mL/min (by C-G formula based on SCr of 0.68 mg/dL). Liver Function Tests: Recent Labs  Lab 07/21/23 0700 07/22/23 0334 07/23/23 0339  AST 454* 254* 207*  ALT 445* 303* 257*  ALKPHOS 488* 389* 382*  BILITOT 3.3* 3.9* 3.5*  PROT 6.6 5.0* 5.0*  ALBUMIN 3.1* 2.6* 2.4*   Recent Labs  Lab 07/21/23 0700  LIPASE 27   No results for input(s): "AMMONIA" in the last 168 hours. Coagulation Profile: Recent Labs  Lab 07/22/23 0334  INR 1.0   Cardiac Enzymes: No results for input(s): "CKTOTAL", "CKMB", "CKMBINDEX", "TROPONINI" in the last 168 hours. BNP (last 3 results) No results for input(s): "PROBNP" in the last 8760 hours. HbA1C: No results for input(s): "HGBA1C" in the last 72 hours. CBG: No results for input(s): "GLUCAP" in the last 168 hours. Lipid Profile: No results for input(s): "CHOL", "HDL", "LDLCALC", "TRIG", "CHOLHDL", "LDLDIRECT" in the last 72 hours. Thyroid Function Tests: No results for input(s): "TSH", "T4TOTAL", "FREET4", "T3FREE", "THYROIDAB" in the last 72 hours. Anemia Panel: No results for input(s): "VITAMINB12", "FOLATE", "FERRITIN", "TIBC", "IRON", "RETICCTPCT" in the last 72 hours. Sepsis Labs: No results for input(s): "PROCALCITON", "LATICACIDVEN" in the last 168 hours.  Recent Results (from the past 240 hour(s))  SARS Coronavirus 2 by RT PCR (hospital order, performed in [facility] hospital lab) *cepheid single  result test* Anterior Nasal Swab     Status: None   Collection Time: 07/21/23  7:49 AM   Specimen: Anterior Nasal Swab  Result Value Ref Range Status   SARS Coronavirus 2 by RT PCR NEGATIVE NEGATIVE Final    Comment: (NOTE) SARS-CoV-2 target nucleic acids are NOT DETECTED.  The SARS-CoV-2 RNA is generally detectable in upper and lower respiratory specimens during the acute phase of infection. The lowest concentration of SARS-CoV-2 viral copies this assay can detect is 250 copies / mL. A negative result does not preclude SARS-CoV-2 infection and should not be used as the sole basis for treatment or other patient management decisions.  A negative result may occur with improper specimen collection / handling, submission of specimen other than nasopharyngeal swab, presence of viral mutation(s) within the areas targeted by this assay, and inadequate number of viral copies (<250 copies / mL). A negative result must be combined with clinical observations, patient history, and epidemiological information.  Fact Sheet for Patients:  RoadLapTop.co.za  Fact Sheet for Healthcare Providers: http://kim-miller.com/  This test is not yet approved or  cleared by the [place] FDA and has been authorized for detection and/or diagnosis of SARS-CoV-2 by FDA under an Emergency Use Authorization (EUA).  This EUA will remain in effect (meaning this test can be used) for the duration of the COVID-19 declaration under Section 564(b)(1) of the Act, 21 U.S.C. section 360bbb-3(b)(1), unless the authorization is terminated or revoked sooner.  Performed at [facility], [address] [place], [place] [address]    Radiology Studies: MR ABDOMEN MRCP W WO CONTAST  Result Date: 07/21/2023 CLINICAL DATA:  Choledocholithiasis suspected prior CT EXAM: MRI ABDOMEN WITHOUT AND WITH CONTRAST (INCLUDING MRCP) TECHNIQUE: Multiplanar multisequence MR  imaging of the abdomen was performed both before and after the administration of intravenous contrast. Heavily T2-weighted images of the biliary and pancreatic ducts were obtained, and three-dimensional MRCP images were rendered by post processing. CONTRAST:  7.21mL GADAVIST GADOBUTROL 1 MMOL/ML IV SOLN COMPARISON:  Same-day CT abdomen pelvis FINDINGS: Lower chest: Trace pleural effusions Hepatobiliary: No solid liver abnormality is seen. Numerous small gallstones in the gallbladder. Numerous small gallstones throughout the central common bile duct to the ampulla, individually measuring up to 0.5 cm (series 3, image 16, series 4, image 19). No significant gallbladder wall thickening. Dilatation of the common bile duct up to 0.8 cm with mild intrahepatic biliary ductal dilatation. Pancreas: Unremarkable. No pancreatic ductal dilatation or surrounding inflammatory changes. Spleen: Normal in size without significant abnormality. Adrenals/Urinary Tract: Adrenal glands are unremarkable. Kidneys are normal, without renal calculi, solid lesion, or hydronephrosis. Stomach/Bowel: Stomach is within normal limits. No evidence of bowel wall thickening, distention, or inflammatory changes. Vascular/Lymphatic: No significant vascular findings are present. No enlarged abdominal lymph nodes. Other: No abdominal wall hernia. Postoperative fat stranding the ventral abdomen in keeping with recent panniculectomy. No ascites. Musculoskeletal: No acute or significant osseous findings. IMPRESSION: 1. Choledocholithiasis with numerous small gallstones throughout the central common bile duct to the ampulla, individually measuring up to 0.5 cm. 2. Dilatation of the common bile duct up to 0.8 cm with mild intrahepatic biliary ductal dilatation. 3. Numerous small gallstones in the gallbladder without significant gallbladder wall thickening. 4. Postoperative fat stranding the ventral abdomen in keeping with recent panniculectomy. 5. Trace  pleural effusions. These results will be called to the ordering clinician or representative by the Radiologist Assistant, and communication documented in the PACS or [employer]. Electronically Signed   By: Jearld Lesch M.D.   On: 07/21/2023 21:24   MR 3D Recon At Scanner  Result Date: 07/21/2023 CLINICAL DATA:  Choledocholithiasis suspected prior CT EXAM: MRI ABDOMEN WITHOUT AND WITH CONTRAST (INCLUDING MRCP) TECHNIQUE: Multiplanar multisequence MR imaging of the abdomen was performed both before and after the administration of intravenous contrast. Heavily T2-weighted images of the biliary and pancreatic ducts were obtained, and three-dimensional MRCP images were rendered by post processing. CONTRAST:  7.36mL GADAVIST GADOBUTROL 1 MMOL/ML IV SOLN COMPARISON:  Same-day CT abdomen pelvis FINDINGS: Lower chest: Trace pleural effusions Hepatobiliary: No solid liver abnormality is seen. Numerous small gallstones in the gallbladder. Numerous small gallstones throughout the central common bile duct to the ampulla, individually measuring up to 0.5 cm (series 3, image 16, series 4, image 19). No significant gallbladder wall thickening. Dilatation of the common bile duct up to 0.8 cm with mild intrahepatic biliary ductal dilatation. Pancreas: Unremarkable. No pancreatic ductal dilatation or surrounding inflammatory changes. Spleen: Normal in size without  significant abnormality. Adrenals/Urinary Tract: Adrenal glands are unremarkable. Kidneys are normal, without renal calculi, solid lesion, or hydronephrosis. Stomach/Bowel: Stomach is within normal limits. No evidence of bowel wall thickening, distention, or inflammatory changes. Vascular/Lymphatic: No significant vascular findings are present. No enlarged abdominal lymph nodes. Other: No abdominal wall hernia. Postoperative fat stranding the ventral abdomen in keeping with recent panniculectomy. No ascites. Musculoskeletal: No acute or significant osseous  findings. IMPRESSION: 1. Choledocholithiasis with numerous small gallstones throughout the central common bile duct to the ampulla, individually measuring up to 0.5 cm. 2. Dilatation of the common bile duct up to 0.8 cm with mild intrahepatic biliary ductal dilatation. 3. Numerous small gallstones in the gallbladder without significant gallbladder wall thickening. 4. Postoperative fat stranding the ventral abdomen in keeping with recent panniculectomy. 5. Trace pleural effusions. These results will be called to the ordering clinician or representative by the Radiologist Assistant, and communication documented in the PACS or [employer]. Electronically Signed   By: Jearld Lesch M.D.   On: 07/21/2023 21:24    Scheduled Meds:  [MAR Hold] ARIPiprazole  2 mg Oral Daily   [MAR Hold] atorvastatin  10 mg Oral QHS   [MAR Hold] DULoxetine  60 mg Oral Daily   [MAR Hold] enoxaparin (LOVENOX) injection  40 mg Subcutaneous Q24H   [MAR Hold] escitalopram  10 mg Oral Daily   [MAR Hold] linaclotide  290 mcg Oral QAC breakfast   [MAR Hold] lurasidone  20 mg Oral Daily   [MAR Hold] nortriptyline  50 mg Oral QHS   [MAR Hold] senna-docusate  2 tablet Oral BID   [MAR Hold] sodium chloride flush  3 mL Intravenous Q12H   [MAR Hold] valACYclovir  1,000 mg Oral Daily   Continuous Infusions:  sodium chloride 125 mL/hr at 07/23/23 0413   sodium chloride     lactated ringers 1,000 mL (07/23/23 1126)     LOS: 2 days    Time spent: 35 Mins    Willeen Niece, MD [facility]   If 7PM-7AM, please contact night-coverage

## 2023-07-23 NOTE — Plan of Care (Signed)

## 2023-07-24 ENCOUNTER — Inpatient Hospital Stay (HOSPITAL_COMMUNITY): Payer: Medicare Other

## 2023-07-24 DIAGNOSIS — R7989 Other specified abnormal findings of blood chemistry: Secondary | ICD-10-CM | POA: Diagnosis not present

## 2023-07-24 LAB — CBC WITH DIFFERENTIAL/PLATELET
Abs Immature Granulocytes: 0.11 10*3/uL — ABNORMAL HIGH (ref 0.00–0.07)
Basophils Absolute: 0.1 10*3/uL (ref 0.0–0.1)
Basophils Relative: 0 %
Eosinophils Absolute: 0.1 10*3/uL (ref 0.0–0.5)
Eosinophils Relative: 1 %
HCT: 19.2 % — ABNORMAL LOW (ref 36.0–46.0)
Hemoglobin: 6.3 g/dL — CL (ref 12.0–15.0)
Immature Granulocytes: 1 %
Lymphocytes Relative: 17 %
Lymphs Abs: 3.2 10*3/uL (ref 0.7–4.0)
MCH: 31.5 pg (ref 26.0–34.0)
MCHC: 32.8 g/dL (ref 30.0–36.0)
MCV: 96 fL (ref 80.0–100.0)
Monocytes Absolute: 1.2 10*3/uL — ABNORMAL HIGH (ref 0.1–1.0)
Monocytes Relative: 6 %
Neutro Abs: 14.1 10*3/uL — ABNORMAL HIGH (ref 1.7–7.7)
Neutrophils Relative %: 75 %
Platelets: 358 10*3/uL (ref 150–400)
RBC: 2 MIL/uL — ABNORMAL LOW (ref 3.87–5.11)
RDW: 15.1 % (ref 11.5–15.5)
WBC: 18.6 10*3/uL — ABNORMAL HIGH (ref 4.0–10.5)
nRBC: 0 % (ref 0.0–0.2)

## 2023-07-24 LAB — COMPREHENSIVE METABOLIC PANEL
ALT: 205 U/L — ABNORMAL HIGH (ref 0–44)
AST: 105 U/L — ABNORMAL HIGH (ref 15–41)
Albumin: 2.4 g/dL — ABNORMAL LOW (ref 3.5–5.0)
Alkaline Phosphatase: 241 U/L — ABNORMAL HIGH (ref 38–126)
Anion gap: 6 (ref 5–15)
BUN: 30 mg/dL — ABNORMAL HIGH (ref 6–20)
CO2: 23 mmol/L (ref 22–32)
Calcium: 8.2 mg/dL — ABNORMAL LOW (ref 8.9–10.3)
Chloride: 108 mmol/L (ref 98–111)
Creatinine, Ser: 0.7 mg/dL (ref 0.44–1.00)
GFR, Estimated: >60 mL/min (ref >60)
Glucose, Bld: 111 mg/dL — ABNORMAL HIGH (ref 70–99)
Potassium: 3.4 mmol/L — ABNORMAL LOW (ref 3.5–5.1)
Sodium: 137 mmol/L (ref 135–145)
Total Bilirubin: 1.5 mg/dL — ABNORMAL HIGH (ref 0.3–1.2)
Total Protein: 4.6 g/dL — ABNORMAL LOW (ref 6.5–8.1)

## 2023-07-24 LAB — PHOSPHORUS: Phosphorus: 2.6 mg/dL (ref 2.5–4.6)

## 2023-07-24 LAB — ABO/RH: ABO/RH(D): O POS

## 2023-07-24 LAB — MAGNESIUM: Magnesium: 1.5 mg/dL — ABNORMAL LOW (ref 1.7–2.4)

## 2023-07-24 LAB — PREPARE RBC (CROSSMATCH)

## 2023-07-24 MED ORDER — SODIUM CHLORIDE 0.9% IV SOLUTION: Freq: Once | INTRAVENOUS | Status: AC

## 2023-07-24 MED ORDER — SODIUM CHLORIDE 0.9 % IV BOLUS
500.0000 mL | Freq: Once | INTRAVENOUS | Status: AC
  Administered 2023-07-24: 500 mL via INTRAVENOUS

## 2023-07-24 MED ORDER — METOPROLOL TARTRATE 12.5 MG HALF TABLET
12.5000 mg | ORAL_TABLET | Freq: Two times a day (BID) | ORAL | Status: DC
  Administered 2023-07-24: 12.5 mg via ORAL
  Filled 2023-07-24: qty 1

## 2023-07-24 MED ORDER — KETOROLAC TROMETHAMINE 15 MG/ML IJ SOLN
15.0000 mg | Freq: Once | INTRAMUSCULAR | Status: AC
  Administered 2023-07-24: 15 mg via INTRAVENOUS
  Filled 2023-07-24: qty 1

## 2023-07-24 MED ORDER — ONDANSETRON HCL 4 MG/2ML IJ SOLN: 4.0000 mg | Freq: Four times a day (QID) | INTRAMUSCULAR | Status: DC | PRN

## 2023-07-24 MED ORDER — SODIUM CHLORIDE 0.9 % IV BOLUS
1000.0000 mL | Freq: Once | INTRAVENOUS | Status: AC
  Administered 2023-07-24: 1000 mL via INTRAVENOUS

## 2023-07-24 NOTE — Progress Notes (Addendum)
Entered room patient in the floor. Patient states "I was walking to the bathroom and fainted". Patient is alert and oriented x 4 , denies any headache, no bruising or abrasions noted , denies any pain. pt unaware if hit head during fall , does c/o dizziness. The  patient was able to stand  with one assist to bed. Bed is in lowest position, side rails up x 2, call bell within reach, bed alarm on. Patient was educated on falls and safety precautions and was encouraged to please use call bell when need any type of assistance. Patient verbalizes an understanding. Notified Abigal Chavez,NP new orders were entered  for orthostatic vs, labs, ct scan of the head, and ns ivf bolus x 1 liter. Patient being transferred to 5 east for remote telemetry monitoring .

## 2023-07-24 NOTE — Progress Notes (Signed)
PROGRESS NOTE    Anna Serrano  ZOX:[id number] DOB: 1976/05/07 DOA: 07/21/2023 PCP: Macy Mis, MD   Brief Narrative:  This 47 years old female with PMH significant for recent panniculectomy 2 days ago, presented in the ED with abdominal discomfort associated with nausea and chills.  Patient reports she was doing well after surgery and was able to tolerate diet however last evening She started having abdominal discomfort and has developed chills and nausea,  denies any vomiting.  Patient reports having intermittent abdominal discomfort for last 3 months which was self resolving.  Imaging in the ED shows choledocholithiasis.  Patient is admitted for further management.  GI is consulted.  MRCP consistent with choledocholithiasis patient underwent ERCP.  General surgery consulted for possible cholecystectomy.  General surgery recommended outpatient follow-up to plan cholecystectomy when she is recovering from panniculectomy or abdominoplasty.  Assessment & Plan:   Principal Problem:   Elevated LFTs Active Problems:   MDD (major depressive disorder), recurrent episode, moderate (HCC)   Blood pressure check   Dyslipidemia   Obstructive jaundice  Choledocholithiasis: Patient presented with worsening abdominal pain after panniculectomy. She reports episodic abdominal pain that has been happening for last 3 months. Workup in the ED showed elevated LFTs with mixed/cholestatic pattern. CT Abdomen showed CBD dilatation. There is high suspicion of choledocholithiasis. MRCP confirmed CBD stones.  GI is consulted. Patient underwent ERCP, gallstone successfully removed. Continue adequate pain control. General surgery consulted recommended outpatient follow-up to plan cholecystectomy when she fully recovered from panniculectomy or abdominoplasty.  Hyperlipidemia Continue Lipitor.  Major depression severe recurrent without psychosis. Continue antipsychotic and antidepressant  medications. Continue Cymbalta, Latuda, Abilify, nortriptyline.  Elevated liver enzymes: Secondary to above. Continue to monitor.  Acute blood loss anemia: Hemoglobin dropped to 10.9 > 8.8> 6.3. There is no obvious visible bleeding. Transfuse 2 unit PRBC. Obtain a stool for occult blood.  Hypokalemia / Hypomagnesemia: Replaced.  Continue to monitor.  Vasovagal syncope: Could be secondary to orthostatic hypotension due to anemia. Transfuse 2 units PRBC Check for orthostatic hypotension.   DVT prophylaxis: Lovenox Code Status:Full code Family Communication: No family at bed side Disposition Plan:   Status is: Inpatient Remains inpatient appropriate because: Patient admitted for choledocholithiasis consistent on MRCP, She underwent ERCP.  Cholecystectomy is delayed until patient fully recovers from abdominoplasty.   Consultants:  Gastroenterology General Surgery  Procedures:ERCP in am  Antimicrobials:  Anti-infectives (From admission, onward)    Start     Dose/Rate Route Frequency Ordered Stop   07/21/23 2030  valACYclovir (VALTREX) tablet 1,000 mg        1,000 mg Oral Daily 07/21/23 1941        Subjective: Patient was seen and examined at bedside.  Overnight events noted. Patient had syncopal episode twice last night. She still feels dizzy and lightheaded. She underwent ERCP yesterday,  stones were removed.  Tolerated well  Objective: Vitals:   07/24/23 0816 07/24/23 0917 07/24/23 0958 07/24/23 1254  BP:  (!) 89/54 (!) 84/61 101/60  Pulse:  96 94 99  Resp: 20 20  17   Temp:    98.2 F (36.8 C)  TempSrc:    Oral  SpO2:  100% 100% 100%  Weight:      Height:        Intake/Output Summary (Last 24 hours) at 07/24/2023 1336 Last data filed at 07/24/2023 0958 Gross per 24 hour  Intake 6286.42 ml  Output --  Net 6286.42 ml   [employer]   07/21/23  2202  Weight: 74.8 kg    Examination:  General exam: Appears calm and comfortable, not in any acute  distress. Respiratory system: CTA bilaterally . Respiratory effort normal.  RR 17 Cardiovascular system: S1 & S2 heard, RRR. No JVD, murmurs, rubs, gallops or clicks. No pedal edema. Gastrointestinal system: Abdomen is soft, mildly tender, non distended, normal bowel sounds heard. Central nervous system: Alert and oriented x 3. No focal neurological deficits. Extremities: No edema, no cyanosis, no clubbing. Skin: No rashes, lesions or ulcers Psychiatry: Judgement and insight appear normal. Mood & affect appropriate.     Data Reviewed: I have personally reviewed following labs and imaging studies  CBC: Recent Labs  Lab 07/21/23 0700 07/21/23 2025 07/22/23 0334 07/23/23 0339 07/24/23 1210  WBC 7.4 7.0 6.9 6.8 18.6*  NEUTROABS 4.5  --   --   --  14.1*  HGB 10.9* 10.3* 8.8* 8.4* 6.3*  HCT 32.4* 31.2* 26.9* 25.4* 19.2*  MCV 91.3 94.5 94.1 93.7 96.0  PLT 452* 419* 354 351 358   Basic Metabolic Panel: Recent Labs  Lab 07/21/23 0700 07/21/23 2025 07/22/23 0334 07/23/23 0339 07/24/23 1210  NA 138  --  138 136 137  K 3.6  --  3.8 3.5 3.4*  CL 107  --  107 107 108  CO2 22  --  23 24 23   GLUCOSE 105*  --  94 97 111*  BUN 9  --  10 6 30*  CREATININE 0.73 0.62 0.75 0.68 0.70  CALCIUM 8.9  --  8.3* 8.0* 8.2*  MG  --   --   --  1.7 1.5*  PHOS  --   --   --  3.9 2.6   GFR: Estimated Creatinine Clearance: 86.1 mL/min (by C-G formula based on SCr of 0.7 mg/dL). Liver Function Tests: Recent Labs  Lab 07/21/23 0700 07/22/23 0334 07/23/23 0339 07/24/23 1210  AST 454* 254* 207* 105*  ALT 445* 303* 257* 205*  ALKPHOS 488* 389* 382* 241*  BILITOT 3.3* 3.9* 3.5* 1.5*  PROT 6.6 5.0* 5.0* 4.6*  ALBUMIN 3.1* 2.6* 2.4* 2.4*   Recent Labs  Lab 07/21/23 0700  LIPASE 27   No results for input(s): "AMMONIA" in the last 168 hours. Coagulation Profile: Recent Labs  Lab 07/22/23 0334  INR 1.0   Cardiac Enzymes: No results for input(s): "CKTOTAL", "CKMB", "CKMBINDEX",  "TROPONINI" in the last 168 hours. BNP (last 3 results) No results for input(s): "PROBNP" in the last 8760 hours. HbA1C: No results for input(s): "HGBA1C" in the last 72 hours. CBG: No results for input(s): "GLUCAP" in the last 168 hours. Lipid Profile: No results for input(s): "CHOL", "HDL", "LDLCALC", "TRIG", "CHOLHDL", "LDLDIRECT" in the last 72 hours. Thyroid Function Tests: No results for input(s): "TSH", "T4TOTAL", "FREET4", "T3FREE", "THYROIDAB" in the last 72 hours. Anemia Panel: No results for input(s): "VITAMINB12", "FOLATE", "FERRITIN", "TIBC", "IRON", "RETICCTPCT" in the last 72 hours. Sepsis Labs: No results for input(s): "PROCALCITON", "LATICACIDVEN" in the last 168 hours.  Recent Results (from the past 240 hour(s))  SARS Coronavirus 2 by RT PCR (hospital order, performed in [facility] hospital lab) *cepheid single result test* Anterior Nasal Swab     Status: None   Collection Time: 07/21/23  7:49 AM   Specimen: Anterior Nasal Swab  Result Value Ref Range Status   SARS Coronavirus 2 by RT PCR NEGATIVE NEGATIVE Final    Comment: (NOTE) SARS-CoV-2 target nucleic acids are NOT DETECTED.  The SARS-CoV-2 RNA is generally detectable in upper  and lower respiratory specimens during the acute phase of infection. The lowest concentration of SARS-CoV-2 viral copies this assay can detect is 250 copies / mL. A negative result does not preclude SARS-CoV-2 infection and should not be used as the sole basis for treatment or other patient management decisions.  A negative result may occur with improper specimen collection / handling, submission of specimen other than nasopharyngeal swab, presence of viral mutation(s) within the areas targeted by this assay, and inadequate number of viral copies (<250 copies / mL). A negative result must be combined with clinical observations, patient history, and epidemiological information.  Fact Sheet for Patients:    RoadLapTop.co.za  Fact Sheet for Healthcare Providers: http://kim-miller.com/  This test is not yet approved or  cleared by the [place] FDA and has been authorized for detection and/or diagnosis of SARS-CoV-2 by FDA under an Emergency Use Authorization (EUA).  This EUA will remain in effect (meaning this test can be used) for the duration of the COVID-19 declaration under Section 564(b)(1) of the Act, 21 U.S.C. section 360bbb-3(b)(1), unless the authorization is terminated or revoked sooner.  Performed at [facility], [address] [place], [place] [address]    Radiology Studies: CT HEAD WO CONTRAST ( )  Result Date: 07/24/2023 CLINICAL DATA:  47 year old female status post fall while inpatient. Dizziness. EXAM: CT HEAD WITHOUT CONTRAST TECHNIQUE: Contiguous axial images were obtained from the base of the skull through the vertex without intravenous contrast. RADIATION DOSE REDUCTION: This exam was performed according to the departmental dose-optimization program which includes automated exposure control, adjustment of the mA and/or kV according to patient size and/or use of iterative reconstruction technique. COMPARISON:  Brain MRI 01/31/2011.  Head CT 08/20/2011 and earlier. 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 No superimposed midline shift, ventriculomegaly, acute mass effect, intracranial hemorrhage or evidence of cortically based acute infarction. Gray-white matter differentiation is within normal limits throughout the brain. Vascular: No suspicious intracranial vascular hyperdensity. Faint chronic basal ganglia vascular calcifications more apparent on the right, stable. Skull: Intact, negative. Sinuses/Orbits: Visualized paranasal sinuses and mastoids are clear. Other: No orbit or scalp soft tissue  injury identified. IMPRESSION: 1. No acute intracranial abnormality or acute traumatic injury identified. 2. 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 Electronically Signed   By: Odessa Fleming M.D.   On: 07/24/2023 05:10   DG ERCP  Result Date: 07/23/2023 CLINICAL DATA:  Choledocholithiasis [id number] Surgery, elective [id number] EXAM: ERCP COMPARISON:  MRCP, 07/13/2023.  CT AP, 07/13/2023. FLUOROSCOPY: Exposure Index (as provided by the fluoroscopic device): 31.4 mGy Kerma FINDINGS: Limited oblique planar images of the RIGHT upper quadrant obtained C-arm. Images demonstrating flexible endoscopy, biliary duct cannulation, sphincterotomy, retrograde cholangiogram and balloon sweep. Mild extrahepatic biliary ductal dilation without overt biliary filling defect is demonstrated. IMPRESSION: Fluoroscopic imaging for ERCP. For complete description of intra procedural findings, please see performing service dictation. Electronically Signed   By: Roanna Banning M.D.   On: 07/23/2023 15:11    Scheduled Meds:  sodium chloride   Intravenous Once   ARIPiprazole  2 mg Oral Daily   atorvastatin  10 mg Oral QHS   DULoxetine  60 mg Oral Daily   enoxaparin (LOVENOX) injection  40 mg Subcutaneous Q24H   escitalopram  10 mg Oral Daily   linaclotide  290 mcg Oral QAC breakfast   lurasidone  20 mg Oral Daily  nortriptyline  50 mg Oral QHS   senna-docusate  2 tablet Oral BID   sodium chloride flush  3 mL Intravenous Q12H   valACYclovir  1,000 mg Oral Daily   Continuous Infusions:  sodium chloride 125 mL/hr at 07/24/23 0325     LOS: 3 days    Time spent: 35 Mins    Willeen Niece, MD [facility]   If 7PM-7AM, please contact night-coverage

## 2023-07-24 NOTE — Plan of Care (Signed)

## 2023-07-24 NOTE — Progress Notes (Signed)
[facility] Progress Note  SUBJECTIVE:   Interval history: Anna Serrano was seen and evaluated today at bedside. Resting comfortably in bed. Overnight events reviewed, had loss of consciousness, CT head unremarkable. Having some lightheadedness and dizziness when standing, better when supine. No worsened abdominal pain. No nausea or vomiting. Tolerated dinner yesterday and breakfast today. No bowel movement since procedure.   Past Medical History:  Diagnosis Date   Anxiety    Arachnoid cyst    Arthritis    Bipolar 1 disorder (HCC)    Chronic back pain    Depression    Fibroids    Fibromyalgia    Genital herpes    Hx of hysterectomy    Hypertension    Insomnia    Interstitial cystitis    Major depressive disorder    Migraine    gets "all the time"   Migraine    Ovarian cyst    PTSD (post-traumatic stress disorder)    Wolff-Parkinson-White (WPW) syndrome    Had EP study 11/20/2013 at [facility] that ruled out WPW syndrome.    Past Surgical History:  Procedure Laterality Date   ABDOMINAL HYSTERECTOMY     CARDIAC CATHETERIZATION     TOOTH EXTRACTION N/A 10/29/2014   Procedure: EXTRACTION OF TEETH #1, #16, #17 & #32;  Surgeon: Francene Finders, DDS;  Location: [facility] OR;  Service: Oral Surgery;  Laterality: N/A;   Current Facility-Administered Medications  Medication Dose Route Frequency Provider Last Rate Last Admin   0.9 %  sodium chloride infusion   Intravenous Continuous Vida Rigger, MD 125 mL/hr at 07/24/23 0325 New Bag at 07/24/23 0325   acetaminophen (TYLENOL) tablet 650 mg  650 mg Oral Q6H PRN Vida Rigger, MD       Or   acetaminophen (TYLENOL) suppository 650 mg  650 mg Rectal Q6H PRN Vida Rigger, MD       ARIPiprazole (ABILIFY) tablet 2 mg  2 mg Oral Daily Vida Rigger, MD   2 mg at 07/24/23 0919   atorvastatin (LIPITOR) tablet 10 mg  10 mg Oral QHS Vida Rigger, MD   10 mg at 07/23/23 2139   cyclobenzaprine (FLEXERIL) tablet 10 mg  10 mg Oral TID PRN Vida Rigger,  MD       DULoxetine (CYMBALTA) DR capsule 60 mg  60 mg Oral Daily Vida Rigger, MD   60 mg at 07/24/23 0918   enoxaparin (LOVENOX) injection 40 mg  40 mg Subcutaneous Q24H Vida Rigger, MD   40 mg at 07/24/23 5621   escitalopram (LEXAPRO) tablet 10 mg  10 mg Oral Daily Vida Rigger, MD   10 mg at 07/24/23 0919   fentaNYL (SUBLIMAZE) injection 25-50 mcg  25-50 mcg Intravenous Q5 min PRN Kaylyn Layer, MD       linaclotide Karlene Einstein) capsule 290 mcg  290 mcg Oral QAC breakfast Vida Rigger, MD   290 mcg at 07/22/23 0848   lurasidone (LATUDA) tablet 20 mg  20 mg Oral Daily Vida Rigger, MD   20 mg at 07/24/23 3086   nortriptyline (PAMELOR) capsule 50 mg  50 mg Oral QHS Vida Rigger, MD   50 mg at 07/23/23 2159   ondansetron (ZOFRAN) injection 4 mg  4 mg Intravenous Q6H PRN Anthoney Harada, NP       oxyCODONE (Oxy IR/ROXICODONE) immediate release tablet 5 mg  5 mg Oral Q4H PRN Vida Rigger, MD   5 mg at 07/22/23 2120   polyethylene glycol (MIRALAX / GLYCOLAX) packet 17 g  17 g Oral Daily PRN Vida Rigger, MD       promethazine (PHENERGAN) tablet 25 mg  25 mg Oral Q8H PRN Vida Rigger, MD       senna-docusate (Senokot-S) tablet 2 tablet  2 tablet Oral BID Vida Rigger, MD   2 tablet at 07/24/23 4403   sodium chloride flush (NS) 0.9 % injection 3 mL  3 mL Intravenous Q12H Vida Rigger, MD   3 mL at 07/24/23 0919   SUMAtriptan (IMITREX) tablet 50 mg  50 mg Oral Q2H PRN Vida Rigger, MD   50 mg at 07/21/23 1848   valACYclovir (VALTREX) tablet 1,000 mg  1,000 mg Oral Daily Vida Rigger, MD   1,000 mg at 07/24/23 4742   Allergies as of 07/21/2023 - Review Complete 07/21/2023  Allergen Reaction Noted   Gabapentin Other (See Comments) 08/02/2018   Topiramate Other (See Comments) 11/28/2021   Zonisamide Other (See Comments) 08/28/2021   Tizanidine Other (See Comments) 01/26/2023   Latex Rash 08/20/2011   Rimegepant Nausea And Vomiting and Other (See Comments) 04/15/2020   Review of Systems:  Review of Systems   Respiratory:  Negative for shortness of breath.   Cardiovascular:  Negative for chest pain.  Gastrointestinal:  Positive for abdominal pain. Negative for nausea and vomiting.    OBJECTIVE:   Temp:  [97.5 F (36.4 C)-98.5 F (36.9 C)] 98.5 F (36.9 C) (08/31 0624) Pulse Rate:  [76-125] 94 (08/31 0958) Resp:  [12-33] 20 (08/31 0917) BP: (70-139)/(49-86) 84/61 (08/31 0958) SpO2:  [98 %-100 %] 100 % (08/31 0958) Last BM Date : 07/19/23 Physical Exam Constitutional:      General: She is not in acute distress.    Appearance: She is not ill-appearing, toxic-appearing or diaphoretic.  Cardiovascular:     Rate and Rhythm: Normal rate and regular rhythm.  Pulmonary:     Effort: No respiratory distress.     Breath sounds: Normal breath sounds.  Abdominal:     General: Bowel sounds are normal. There is no distension.     Palpations: Abdomen is soft.     Tenderness: There is abdominal tenderness (diffuse, mild).  Skin:    General: Skin is warm and dry.     Comments: Incisions on abdomen appear well healing  Neurological:     Mental Status: She is alert.     Labs: Recent Labs    07/21/23 2025 07/22/23 0334 07/23/23 0339  WBC 7.0 6.9 6.8  HGB 10.3* 8.8* 8.4*  HCT 31.2* 26.9* 25.4*  PLT 419* 354 351   BMET Recent Labs    07/21/23 2025 07/22/23 0334 07/23/23 0339  NA  --  138 136  K  --  3.8 3.5  CL  --  107 107  CO2  --  23 24  GLUCOSE  --  94 97  BUN  --  10 6  CREATININE 0.62 0.75 0.68  CALCIUM  --  8.3* 8.0*   LFT Recent Labs    07/22/23 0334 07/23/23 0339  PROT 5.0* 5.0*  ALBUMIN 2.6* 2.4*  AST 254* 207*  ALT 303* 257*  ALKPHOS 389* 382*  BILITOT 3.9* 3.5*  BILIDIR 2.3*  --   IBILI 1.6*  --    PT/INR Recent Labs    07/22/23 0334  LABPROT 13.2  INR 1.0   Diagnostic imaging: CT HEAD WO CONTRAST ( )  Result Date: 07/24/2023 CLINICAL DATA:  47 year old female status post fall while inpatient. Dizziness. EXAM: CT HEAD WITHOUT CONTRAST TECHNIQUE:  Contiguous  axial images were obtained from the base of the skull through the vertex without intravenous contrast. RADIATION DOSE REDUCTION: This exam was performed according to the departmental dose-optimization program which includes automated exposure control, adjustment of the mA and/or kV according to patient size and/or use of iterative reconstruction technique. COMPARISON:  Brain MRI 01/31/2011.  Head CT 08/20/2011 and earlier. 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 No superimposed midline shift, ventriculomegaly, acute mass effect, intracranial hemorrhage or evidence of cortically based acute infarction. Gray-white matter differentiation is within normal limits throughout the brain. Vascular: No suspicious intracranial vascular hyperdensity. Faint chronic basal ganglia vascular calcifications more apparent on the right, stable. Skull: Intact, negative. Sinuses/Orbits: Visualized paranasal sinuses and mastoids are clear. Other: No orbit or scalp soft tissue injury identified. IMPRESSION: 1. No acute intracranial abnormality or acute traumatic injury identified. 2. 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 Electronically Signed   By: Odessa Fleming M.D.   On: 07/24/2023 05:10   DG ERCP  Result Date: 07/23/2023 CLINICAL DATA:  Choledocholithiasis [id number] Surgery, elective [id number] EXAM: ERCP COMPARISON:  MRCP, 07/13/2023.  CT AP, 07/13/2023. FLUOROSCOPY: Exposure Index (as provided by the fluoroscopic device): 31.4 mGy Kerma FINDINGS: Limited oblique planar images of the RIGHT upper quadrant obtained C-arm. Images demonstrating flexible endoscopy, biliary duct cannulation, sphincterotomy, retrograde cholangiogram and balloon sweep. Mild extrahepatic biliary ductal dilation without overt biliary filling defect is demonstrated. IMPRESSION: Fluoroscopic imaging for  ERCP. For complete description of intra procedural findings, please see performing service dictation. Electronically Signed   By: Roanna Banning M.D.   On: 07/23/2023 15:11    IMPRESSION: Choledocholithiasis Abdominal pain Transaminase elevation in mixed pattern, pending AM labs History gastric sleeve History panniculectomy on 07/12/23 Chronic constipation   PLAN: -General Surgery recommendations reviewed, outpatient cholecystectomy -Trend liver enzymes -No worsened abdominal pain -Diet as tolerated -Your IM management lightheadedness/dizziness -[facility] will follow   LOS: 3 days   Liliane Shi, [facility]

## 2023-07-24 NOTE — Progress Notes (Signed)
MD was informed of the Pt's heart rate. PO Metoprolol was ordered and given. Pt was symptomatic, reported minimal dizziness. HR lowered but BP also dropped at 89/54, MD ordered bolus of NS . BP remained at 84/6. MD was notified, no additional order at the moment. Current BP is 101/60.  07/24/23 0800  Vitals  ECG Heart Rate (!) 132  Resp (!) 25  MEWS COLOR  MEWS Score Color Red  MEWS Score  MEWS Temp 0  MEWS Systolic 0  MEWS Pulse 3  MEWS RR 1  MEWS LOC 0  MEWS Score 4

## 2023-07-24 NOTE — Progress Notes (Signed)
    Patient Name: Anna Serrano           DOB: [birth year]  MRN: [id number]      Admission Date: 07/21/2023  Attending Provider: Willeen Niece, MD  Primary Diagnosis: Elevated LFTs   Level of care: Telemetry    CROSS COVER NOTE   Date of Service   07/24/2023   Anna Serrano, 47 y.o. female, was admitted on 07/21/2023 for Elevated LFTs.    HPI/Events of Note   Syncopal episode-history of vasovagal syncope.  Last episode was a year ago. Unwitnessed fall  Patient reports feeling lightheaded all day, especially when changing positions or ambulating.   Unwitnessed fall occurred while patient was ambulating to restroom.  Unclear as to how long patient was unconscious.  However nursing staff states that they were in and out of patient's room.  When they returned to patient's room after 1-2 min patient was found on the floor laying supine and regaining consciousness.  Patient was able to get up with assistance. Patient is currently A/O x4.  Reports slight frontal headache.  Main complaint is dizziness and lightheadedness.  There are no visible signs of injury or deformities.  Skin is intact.  ROM at baseline. No neuro deficits.   Initial vital signs stable.  Nursing staff was asked to obtain orthostatic vital signs.  BP dropped, 70/49, while sitting at the edge of the bed.  Patient was unable to complete orthostatic vital signs.  1 L IVF ordered for BP.   Interventions/ Plan   CT head ordered-no acute intercranial abnormality or acute traumatic injury Orthostatic vital signs Cardiac telemetry Nursing staff to continue monitoring for change in acute symptoms, mobility, and pain.        Anthoney Harada, DNP, ACNPC- AG [facility]

## 2023-07-24 NOTE — Progress Notes (Signed)
1 Day Post-Op   Subjective/Chief Complaint: Patient concerned about fainting spells overnight.  States she has a diagnosis of vasovagal syncope and has seen a cardiologist.  No major abdominal complaints this morning.   Objective: Vital signs in last 24 hours: Temp:  [97.5 F (36.4 C)-98.5 F (36.9 C)] 98.5 F (36.9 C) (08/31 0624) Pulse Rate:  [76-125] 116 (08/31 0624) Resp:  [12-33] 20 (08/31 0816) BP: (70-139)/(49-86) 101/67 (08/31 0624) SpO2:  [98 %-100 %] 100 % (08/31 0624) Last BM Date : 07/19/23  Intake/Output from previous day: 08/30 0701 - 08/31 0700 In: 5527.2 [I.V.:5463.9; IV Piggyback:63.3] Out: -  Intake/Output this shift: No intake/output data recorded.  Alert, well-appearing Unlabored respirations Abdomen is soft, nontender.  Surgical drains with serosanguineous output  Lab Results:  Recent Labs    07/22/23 0334 07/23/23 0339  WBC 6.9 6.8  HGB 8.8* 8.4*  HCT 26.9* 25.4*  PLT 354 351   BMET Recent Labs    07/22/23 0334 07/23/23 0339  NA 138 136  K 3.8 3.5  CL 107 107  CO2 23 24  GLUCOSE 94 97  BUN 10 6  CREATININE 0.75 0.68  CALCIUM 8.3* 8.0*   PT/INR Recent Labs    07/22/23 0334  LABPROT 13.2  INR 1.0   ABG No results for input(s): "PHART", "HCO3" in the last 72 hours.  Invalid input(s): "PCO2", "PO2"  Studies/Results: CT HEAD WO CONTRAST ( )  Result Date: 07/24/2023 CLINICAL DATA:  47 year old female status post fall while inpatient. Dizziness. EXAM: CT HEAD WITHOUT CONTRAST TECHNIQUE: Contiguous axial images were obtained from the base of the skull through the vertex without intravenous contrast. RADIATION DOSE REDUCTION: This exam was performed according to the departmental dose-optimization program which includes automated exposure control, adjustment of the mA and/or kV according to patient size and/or use of iterative reconstruction technique. COMPARISON:  Brain MRI 01/31/2011.  Head CT 08/20/2011 and earlier. 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 No superimposed midline shift, ventriculomegaly, acute mass effect, intracranial hemorrhage or evidence of cortically based acute infarction. Gray-white matter differentiation is within normal limits throughout the brain. Vascular: No suspicious intracranial vascular hyperdensity. Faint chronic basal ganglia vascular calcifications more apparent on the right, stable. Skull: Intact, negative. Sinuses/Orbits: Visualized paranasal sinuses and mastoids are clear. Other: No orbit or scalp soft tissue injury identified. IMPRESSION: 1. No acute intracranial abnormality or acute traumatic injury identified. 2. 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 Electronically Signed   By: Odessa Fleming M.D.   On: 07/24/2023 05:10   DG ERCP  Result Date: 07/23/2023 CLINICAL DATA:  Choledocholithiasis [id number] Surgery, elective [id number] EXAM: ERCP COMPARISON:  MRCP, 07/13/2023.  CT AP, 07/13/2023. FLUOROSCOPY: Exposure Index (as provided by the fluoroscopic device): 31.4 mGy Kerma FINDINGS: Limited oblique planar images of the RIGHT upper quadrant obtained C-arm. Images demonstrating flexible endoscopy, biliary duct cannulation, sphincterotomy, retrograde cholangiogram and balloon sweep. Mild extrahepatic biliary ductal dilation without overt biliary filling defect is demonstrated. IMPRESSION: Fluoroscopic imaging for ERCP. For complete description of intra procedural findings, please see performing service dictation. Electronically Signed   By: Roanna Banning M.D.   On: 07/23/2023 15:11    Anti-infectives: Anti-infectives (From admission, onward)    Start     Dose/Rate Route Frequency Ordered Stop   07/21/23 2030  valACYclovir (VALTREX) tablet 1,000 mg        1,000 mg Oral Daily  07/21/23 1941         Assessment/Plan: s/p Procedure(s): ENDOSCOPIC  RETROGRADE CHOLANGIOPANCREATOGRAPHY (ERCP) (N/A) SPHINCTEROTOMY REMOVAL OF STONES   She will need outpatient follow-up to plan cholecystectomy once she is further out from her panniculectomy/abdominoplasty. We will be happy to see her back in our office to arrange this or if she would like to keep her surgical care at [facility] would recommend a referral be placed to the general surgery service there.  She will be seeing her plastic surgeon next week.  I discussed with her that there is a risk of recurrent choledocholithiasis in the interim, but weighing that risk versus potentially incurring issues from her recent surgery favors delayed cholecystectomy at this point.  Defer further workup of her fainting events to primary team.  Surgery service will be available as needed.    LOS: 3 days    Berna Bue 07/24/2023

## 2023-07-24 NOTE — Progress Notes (Signed)
MEWS Progress Note  Patient Details Name: Anna Serrano MRN: [id number] DOB: 1976/07/19 Today's Date: 07/24/2023   MEWS Flowsheet Documentation:  Assess: MEWS Score Temp: 98.5 F (36.9 C) BP: 101/67 MAP (mmHg): 79 Pulse Rate: (!) 116 ECG Heart Rate: 90 Resp: 20 Level of Consciousness: Alert SpO2: 100 % O2 Device: Room Air O2 Flow Rate (L/min): 2 L/min Assess: MEWS Score MEWS Temp: 0 MEWS Systolic: 0 MEWS Pulse: 2 MEWS RR: 0 MEWS LOC: 0 MEWS Score: 2 MEWS Score Color: Yellow Assess: SIRS CRITERIA SIRS Temperature : 0 SIRS Respirations : 0 SIRS Pulse: 1 SIRS WBC: 0 SIRS Score Sum : 1 Assess: if the MEWS score is Yellow or Red Were vital signs accurate and taken at a resting state?: Yes Does the patient meet 2 or more of the SIRS criteria?: No MEWS guidelines implemented : Yes, yellow Treat MEWS Interventions: Considered administering scheduled or prn medications/treatments as ordered Take Vital Signs Increase Vital Sign Frequency : Yellow: Q2hr x1, continue Q4hrs until patient remains green for 12hrs Escalate MEWS: Escalate: Yellow: Discuss with charge nurse and consider notifying provider and/or RRT Notify: Charge Nurse/RN Name of Charge Nurse/RN Notified: Dwana Curd, RN      [employer] 07/24/2023, 7:23 AM

## 2023-07-25 DIAGNOSIS — R7989 Other specified abnormal findings of blood chemistry: Secondary | ICD-10-CM | POA: Diagnosis not present

## 2023-07-25 LAB — CBC WITH DIFFERENTIAL/PLATELET
Abs Immature Granulocytes: 0.07 10*3/uL (ref 0.00–0.07)
Basophils Absolute: 0.1 10*3/uL (ref 0.0–0.1)
Basophils Relative: 1 %
Eosinophils Absolute: 0.3 10*3/uL (ref 0.0–0.5)
Eosinophils Relative: 2 %
HCT: 21.5 % — ABNORMAL LOW (ref 36.0–46.0)
Hemoglobin: 7.2 g/dL — ABNORMAL LOW (ref 12.0–15.0)
Immature Granulocytes: 1 %
Lymphocytes Relative: 26 %
Lymphs Abs: 3.5 10*3/uL (ref 0.7–4.0)
MCH: 29.9 pg (ref 26.0–34.0)
MCHC: 33.5 g/dL (ref 30.0–36.0)
MCV: 89.2 fL (ref 80.0–100.0)
Monocytes Absolute: 0.6 10*3/uL (ref 0.1–1.0)
Monocytes Relative: 5 %
Neutro Abs: 8.7 10*3/uL — ABNORMAL HIGH (ref 1.7–7.7)
Neutrophils Relative %: 65 %
Platelets: 252 10*3/uL (ref 150–400)
RBC: 2.41 MIL/uL — ABNORMAL LOW (ref 3.87–5.11)
RDW: 16.2 % — ABNORMAL HIGH (ref 11.5–15.5)
WBC: 13.3 10*3/uL — ABNORMAL HIGH (ref 4.0–10.5)
nRBC: 0 % (ref 0.0–0.2)

## 2023-07-25 LAB — BASIC METABOLIC PANEL
Anion gap: 6 (ref 5–15)
BUN: 26 mg/dL — ABNORMAL HIGH (ref 6–20)
CO2: 23 mmol/L (ref 22–32)
Calcium: 7.6 mg/dL — ABNORMAL LOW (ref 8.9–10.3)
Chloride: 109 mmol/L (ref 98–111)
Creatinine, Ser: 0.73 mg/dL (ref 0.44–1.00)
GFR, Estimated: >60 mL/min (ref >60)
Glucose, Bld: 107 mg/dL — ABNORMAL HIGH (ref 70–99)
Potassium: 3.4 mmol/L — ABNORMAL LOW (ref 3.5–5.1)
Sodium: 138 mmol/L (ref 135–145)

## 2023-07-25 LAB — PHOSPHORUS: Phosphorus: 3.4 mg/dL (ref 2.5–4.6)

## 2023-07-25 LAB — HEPATIC FUNCTION PANEL
ALT: 147 U/L — ABNORMAL HIGH (ref 0–44)
AST: 69 U/L — ABNORMAL HIGH (ref 15–41)
Albumin: 2.1 g/dL — ABNORMAL LOW (ref 3.5–5.0)
Alkaline Phosphatase: 156 U/L — ABNORMAL HIGH (ref 38–126)
Bilirubin, Direct: 0.5 mg/dL — ABNORMAL HIGH (ref 0.0–0.2)
Indirect Bilirubin: 0.8 mg/dL (ref 0.3–0.9)
Total Bilirubin: 1.3 mg/dL — ABNORMAL HIGH (ref 0.3–1.2)
Total Protein: 4 g/dL — ABNORMAL LOW (ref 6.5–8.1)

## 2023-07-25 LAB — MAGNESIUM: Magnesium: 1.5 mg/dL — ABNORMAL LOW (ref 1.7–2.4)

## 2023-07-25 LAB — HEMOGLOBIN AND HEMATOCRIT, BLOOD
HCT: 22.4 % — ABNORMAL LOW (ref 36.0–46.0)
Hemoglobin: 7.5 g/dL — ABNORMAL LOW (ref 12.0–15.0)

## 2023-07-25 MED ORDER — MAGNESIUM SULFATE 2 GM/50ML IV SOLN
2.0000 g | Freq: Once | INTRAVENOUS | Status: AC
  Administered 2023-07-25: 2 g via INTRAVENOUS
  Filled 2023-07-25: qty 50

## 2023-07-25 MED ORDER — POTASSIUM CHLORIDE 20 MEQ PO PACK
40.0000 meq | PACK | Freq: Once | ORAL | Status: AC
  Administered 2023-07-25: 40 meq via ORAL
  Filled 2023-07-25: qty 2

## 2023-07-25 MED ORDER — SODIUM CHLORIDE 0.9 % IV BOLUS: 500.0000 mL | Freq: Once | INTRAVENOUS | Status: DC

## 2023-07-25 MED ORDER — PANTOPRAZOLE SODIUM 40 MG IV SOLR
40.0000 mg | Freq: Two times a day (BID) | INTRAVENOUS | Status: DC
  Administered 2023-07-25 – 2023-07-29 (×9): 40 mg via INTRAVENOUS
  Filled 2023-07-25 (×9): qty 10

## 2023-07-25 NOTE — Progress Notes (Signed)
[facility] Progress Note  SUBJECTIVE:   Interval history: Anna Serrano was seen and evaluated today at bedside. Resting comfortably in bed. Denied worsened abdominal pain. No nausea or vomiting, tolerated breakfast this AM. No chest pain or shortness of breath. Had moderate sized melanic stool this AM, visualized in commode. AM labs of CBC and hepatic function panel pending add-on and pending results.   Past Medical History:  Diagnosis Date   Anxiety    Arachnoid cyst    Arthritis    Bipolar 1 disorder (HCC)    Chronic back pain    Depression    Fibroids    Fibromyalgia    Genital herpes    Hx of hysterectomy    Hypertension    Insomnia    Interstitial cystitis    Major depressive disorder    Migraine    gets "all the time"   Migraine    Ovarian cyst    PTSD (post-traumatic stress disorder)    Wolff-Parkinson-White (WPW) syndrome    Had EP study 11/20/2013 at [facility] that ruled out WPW syndrome.    Past Surgical History:  Procedure Laterality Date   ABDOMINAL HYSTERECTOMY     CARDIAC CATHETERIZATION     TOOTH EXTRACTION N/A 10/29/2014   Procedure: EXTRACTION OF TEETH #1, #16, #17 & #32;  Surgeon: Francene Finders, DDS;  Location: [facility] OR;  Service: Oral Surgery;  Laterality: N/A;   Current Facility-Administered Medications  Medication Dose Route Frequency Provider Last Rate Last Admin   0.9 %  sodium chloride infusion   Intravenous Continuous Vida Rigger, MD 125 mL/hr at 07/24/23 1603 New Bag at 07/24/23 1603   acetaminophen (TYLENOL) tablet 650 mg  650 mg Oral Q6H PRN Vida Rigger, MD       Or   acetaminophen (TYLENOL) suppository 650 mg  650 mg Rectal Q6H PRN Vida Rigger, MD       ARIPiprazole (ABILIFY) tablet 2 mg  2 mg Oral Daily Vida Rigger, MD   2 mg at 07/25/23 2130   atorvastatin (LIPITOR) tablet 10 mg  10 mg Oral QHS Vida Rigger, MD   10 mg at 07/24/23 2125   cyclobenzaprine (FLEXERIL) tablet 10 mg  10 mg Oral TID PRN Vida Rigger, MD       DULoxetine  (CYMBALTA) DR capsule 60 mg  60 mg Oral Daily Vida Rigger, MD   60 mg at 07/25/23 0934   enoxaparin (LOVENOX) injection 40 mg  40 mg Subcutaneous Q24H Vida Rigger, MD   40 mg at 07/25/23 0803   escitalopram (LEXAPRO) tablet 10 mg  10 mg Oral Daily Vida Rigger, MD   10 mg at 07/25/23 0935   fentaNYL (SUBLIMAZE) injection 25-50 mcg  25-50 mcg Intravenous Q5 min PRN Kaylyn Layer, MD       linaclotide Karlene Einstein) capsule 290 mcg  290 mcg Oral QAC breakfast Vida Rigger, MD   290 mcg at 07/25/23 0803   lurasidone (LATUDA) tablet 20 mg  20 mg Oral Daily Vida Rigger, MD   20 mg at 07/25/23 0934   nortriptyline (PAMELOR) capsule 50 mg  50 mg Oral QHS Vida Rigger, MD   50 mg at 07/24/23 2125   ondansetron (ZOFRAN) injection 4 mg  4 mg Intravenous Q6H PRN Anthoney Harada, NP       oxyCODONE (Oxy IR/ROXICODONE) immediate release tablet 5 mg  5 mg Oral Q4H PRN Vida Rigger, MD   5 mg at 07/22/23 2120   polyethylene glycol (MIRALAX / GLYCOLAX)  packet 17 g  17 g Oral Daily PRN Vida Rigger, MD       promethazine (PHENERGAN) tablet 25 mg  25 mg Oral Q8H PRN Vida Rigger, MD       senna-docusate (Senokot-S) tablet 2 tablet  2 tablet Oral BID Vida Rigger, MD   2 tablet at 07/25/23 0935   sodium chloride flush (NS) 0.9 % injection 3 mL  3 mL Intravenous Q12H Vida Rigger, MD   3 mL at 07/25/23 1018   SUMAtriptan (IMITREX) tablet 50 mg  50 mg Oral Q2H PRN Vida Rigger, MD   50 mg at 07/24/23 1143   valACYclovir (VALTREX) tablet 1,000 mg  1,000 mg Oral Daily Vida Rigger, MD   1,000 mg at 07/25/23 2355   Allergies as of 07/21/2023 - Review Complete 07/21/2023  Allergen Reaction Noted   Gabapentin Other (See Comments) 08/02/2018   Topiramate Other (See Comments) 11/28/2021   Zonisamide Other (See Comments) 08/28/2021   Tizanidine Other (See Comments) 01/26/2023   Latex Rash 08/20/2011   Rimegepant Nausea And Vomiting and Other (See Comments) 04/15/2020   Review of Systems:  Review of Systems  Respiratory:  Negative  for shortness of breath.   Cardiovascular:  Negative for chest pain.  Gastrointestinal:  Positive for melena. Negative for abdominal pain, nausea and vomiting.    OBJECTIVE:   Temp:  [97.8 F (36.6 C)-98.5 F (36.9 C)] 97.9 F (36.6 C) (09/01 0808) Pulse Rate:  [99-108] 99 (09/01 0808) Resp:  [16-19] 18 (09/01 0808) BP: (88-114)/(54-76) 100/62 (09/01 0808) SpO2:  [100 %] 100 % (09/01 0808) Last BM Date : 07/25/23 Physical Exam Constitutional:      General: She is not in acute distress.    Appearance: She is not ill-appearing, toxic-appearing or diaphoretic.  Cardiovascular:     Rate and Rhythm: Normal rate and regular rhythm.  Pulmonary:     Effort: No respiratory distress.     Breath sounds: Normal breath sounds.  Abdominal:     Tenderness: There is no abdominal tenderness.     Comments: Abdominal binder in place.  Skin:    General: Skin is warm and dry.  Neurological:     Mental Status: She is alert.     Labs: Recent Labs    07/23/23 0339 07/24/23 1210  WBC 6.8 18.6*  HGB 8.4* 6.3*  HCT 25.4* 19.2*  PLT 351 358   BMET Recent Labs    07/23/23 0339 07/24/23 1210 07/25/23 0626  NA 136 137 138  K 3.5 3.4* 3.4*  CL 107 108 109  CO2 24 23 23   GLUCOSE 97 111* 107*  BUN 6 30* 26*  CREATININE 0.68 0.70 0.73  CALCIUM 8.0* 8.2* 7.6*   LFT Recent Labs    07/24/23 1210  PROT 4.6*  ALBUMIN 2.4*  AST 105*  ALT 205*  ALKPHOS 241*  BILITOT 1.5*   PT/INR No results for input(s): "LABPROT", "INR" in the last 72 hours. Diagnostic imaging: CT HEAD WO CONTRAST ( )  Result Date: 07/24/2023 CLINICAL DATA:  47 year old female status post fall while inpatient. Dizziness. EXAM: CT HEAD WITHOUT CONTRAST TECHNIQUE: Contiguous axial images were obtained from the base of the skull through the vertex without intravenous contrast. RADIATION DOSE REDUCTION: This exam was performed according to the departmental dose-optimization program which includes automated exposure  control, adjustment of the mA and/or kV according to patient size and/or use of iterative reconstruction technique. COMPARISON:  Brain MRI 01/31/2011.  Head CT 08/20/2011 and earlier. FINDINGS: Brain: Chronic  cavum velum interpositum (normal variant, favored) versus midline arachnoid cyst (series 3, image 19), unchanged since at least 2011. No superimposed midline shift, ventriculomegaly, acute mass effect, intracranial hemorrhage or evidence of cortically based acute infarction. Gray-white matter differentiation is within normal limits throughout the brain. Vascular: No suspicious intracranial vascular hyperdensity. Faint chronic basal ganglia vascular calcifications more apparent on the right, stable. Skull: Intact, negative. Sinuses/Orbits: Visualized paranasal sinuses and mastoids are clear. Other: No orbit or scalp soft tissue injury identified. IMPRESSION: 1. No acute intracranial abnormality or acute traumatic injury identified. 2. 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 Electronically Signed   By: Odessa Fleming M.D.   On: 07/24/2023 05:10   DG ERCP  Result Date: 07/23/2023 CLINICAL DATA:  Choledocholithiasis [id number] Surgery, elective [id number] EXAM: ERCP COMPARISON:  MRCP, 07/13/2023.  CT AP, 07/13/2023. FLUOROSCOPY: Exposure Index (as provided by the fluoroscopic device): 31.4 mGy Kerma FINDINGS: Limited oblique planar images of the RIGHT upper quadrant obtained C-arm. Images demonstrating flexible endoscopy, biliary duct cannulation, sphincterotomy, retrograde cholangiogram and balloon sweep. Mild extrahepatic biliary ductal dilation without overt biliary filling defect is demonstrated. IMPRESSION: Fluoroscopic imaging for ERCP. For complete description of intra procedural findings, please see performing service dictation. Electronically Signed   By: Roanna Banning M.D.   On: 07/23/2023 15:11    IMPRESSION: Choledocholithiasis  -S/p ERCP 07/23/23 with sphincterotomy and  balloon sweep with Dr. Ewing Schlein Melena Normocytic anemia Abdominal pain, improved  Transaminase elevation in mixed pattern  History gastric sleeve History panniculectomy on 07/12/23 Chronic constipation  PLAN: -Melena and anemia after ERCP with sphincterotomy raises suspicion for post-sphincterotomy bleed -Trend H/H, transfuse for Hgb < 7  -Monitor for further melena  -I have communicated above concern with advanced endoscopy provider covering weekend, [facility] Dr. Marina Goodell, awaiting further recommendations -Ok for diet today, make NPO at midnight if procedure needed 07/26/23 -Discussed above concerns with patient, she is understanding    LOS: 4 days   Liliane Shi, [facility]

## 2023-07-25 NOTE — Plan of Care (Signed)

## 2023-07-25 NOTE — Plan of Care (Signed)

## 2023-07-25 NOTE — Progress Notes (Signed)
PROGRESS NOTE    Anna Serrano  GHW:[id number] DOB: 11-25-1975 DOA: 07/21/2023 PCP: Macy Mis, MD   Brief Narrative:  This 47 years old female with PMH significant for recent panniculectomy 2 days ago, presented in the ED with abdominal discomfort associated with nausea and chills.  Patient reports she was doing well after surgery and was able to tolerate diet however last evening She started having abdominal discomfort and has developed chills and nausea,  denies any vomiting.  Patient reports having intermittent abdominal discomfort for last 3 months which was self resolving.  Imaging in the ED shows choledocholithiasis.  Patient is admitted for further management.  GI is consulted.  MRCP consistent with choledocholithiasis patient underwent ERCP.  General surgery consulted for possible cholecystectomy.  General surgery recommended outpatient follow-up to plan cholecystectomy when she is recovering from panniculectomy or abdominoplasty.  Assessment & Plan:   Principal Problem:   Elevated LFTs Active Problems:   MDD (major depressive disorder), recurrent episode, moderate (HCC)   Blood pressure check   Dyslipidemia   Obstructive jaundice  Choledocholithiasis: Patient presented with worsening abdominal pain after panniculectomy. She reports episodic abdominal pain that has been happening for last 3 months. Workup in the ED showed elevated LFTs with mixed/cholestatic pattern. CT Abdomen showed CBD dilatation. There is high suspicion of choledocholithiasis. MRCP confirmed CBD stones.  GI is consulted. Patient underwent ERCP, gallstone successfully removed. Continue adequate pain control. General surgery consulted recommended outpatient follow-up to plan cholecystectomy when she fully recovered from panniculectomy or abdominoplasty.  Hyperlipidemia: Continue Lipitor.  Major depression severe recurrent without psychosis. Continue antipsychotic and antidepressant  medications. Continue Cymbalta, Latuda, Abilify, nortriptyline.  Elevated liver enzymes: Secondary to above. Continue to monitor. LFTS improving.  Acute blood loss anemia: Hemoglobin dropped to 10.9 > 8.8> 6.3. There is no obvious visible bleeding. S/p Transfuse 2 unit PRBC. Hb 7.5 Melena and anemia after ERCP raises suspicion for post sphincterectomy bleeding. GI is aware.  N.p.o. Monday night for possible procedure on Tuesday.  Hypokalemia / Hypomagnesemia: Replaced.  Continue to monitor.  Vasovagal syncope: Could be secondary to orthostatic hypotension due to anemia. Transfuse 2 units PRBC Patient reports feeling better going to the bathroom without any dizziness.   DVT prophylaxis: Lovenox Code Status:Full code Family Communication: No family at bed side Disposition Plan:   Status is: Inpatient Remains inpatient appropriate because: Patient admitted for choledocholithiasis consistent on MRCP, She underwent ERCP.  Cholecystectomy is delayed until patient fully recovers from abdominoplasty.   Consultants:  Gastroenterology General Surgery  Procedures:ERCP in am  Antimicrobials:  Anti-infectives (From admission, onward)    Start     Dose/Rate Route Frequency Ordered Stop   07/21/23 2030  valACYclovir (VALTREX) tablet 1,000 mg        1,000 mg Oral Daily 07/21/23 1941        Subjective: Patient was seen and examined at bedside.  Overnight events noted. Patient reports doing better. She denies further dizziness or lightheadedness.   She has been going to bathroom without any symptoms.   Objective: Vitals:   07/25/23 0126 07/25/23 0330 07/25/23 0808 07/25/23 1125  BP: 107/60 (!) 94/56 100/62 105/75  Pulse: (!) 102 (!) 103 99 77  Resp: 19 16 18 16   Temp: 97.8 F (36.6 C) 98.5 F (36.9 C) 97.9 F (36.6 C) 99 F (37.2 C)  TempSrc: Oral   Oral  SpO2: 100% 100% 100% 100%  Weight:      Height:  Intake/Output Summary (Last 24 hours) at 07/25/2023  1424 Last data filed at 07/25/2023 0936 Gross per 24 hour  Intake 1470 ml  Output 40 ml  Net 1430 ml   Filed Weights   07/21/23 0655  Weight: 74.8 kg    Examination:  General exam: Appears comfortable, deconditioned, not in any acute distress. Respiratory system: CTA bilaterally . Respiratory effort normal.  RR 17 Cardiovascular system: S1 & S2 heard, RRR. No JVD, murmurs, rubs, gallops or clicks. No pedal edema. Gastrointestinal system: Abdomen is soft, mildly tender, non distended, normal bowel sounds heard. Central nervous system: Alert and oriented x 3. No focal neurological deficits. Extremities: No edema, no cyanosis, no clubbing. Skin: No rashes, lesions or ulcers Psychiatry: Judgement and insight appear normal. Mood & affect appropriate.     Data Reviewed: I have personally reviewed following labs and imaging studies  CBC: Recent Labs  Lab 07/21/23 0700 07/22/23 0334 07/23/23 0339 07/24/23 1210 07/25/23 0626 07/25/23 1316  WBC 7.4 6.9 6.8 18.6* 13.3*  --   NEUTROABS 4.5  --   --  14.1* 8.7*  --   HGB 10.9* 8.8* 8.4* 6.3* 7.2* 7.5*  HCT 32.4* 26.9* 25.4* 19.2* 21.5* 22.4*  MCV 91.3 94.1 93.7 96.0 89.2  --   PLT 452* 354 351 358 252  --    Basic Metabolic Panel: Recent Labs  Lab 07/21/23 0700 07/22/23 0334 07/23/23 0339 07/24/23 1210 07/25/23 0626  NA 138 138 136 137 138  K 3.6 3.8 3.5 3.4* 3.4*  CL 107 107 107 108 109  CO2 22 23 24 23 23   GLUCOSE 105* 94 97 111* 107*  BUN 9 10 6  30* 26*  CREATININE 0.73 0.75 0.68 0.70 0.73  CALCIUM 8.9 8.3* 8.0* 8.2* 7.6*  MG  --   --  1.7 1.5* 1.5*  PHOS  --   --  3.9 2.6 3.4   GFR: Estimated Creatinine Clearance: 86.1 mL/min (by C-G formula based on SCr of 0.73 mg/dL). Liver Function Tests: Recent Labs  Lab 07/21/23 0700 07/22/23 0334 07/23/23 0339 07/24/23 1210 07/25/23 0626  AST 454* 254* 207* 105* 69*  ALT 445* 303* 257* 205* 147*  ALKPHOS 488* 389* 382* 241* 156*  BILITOT 3.3* 3.9* 3.5* 1.5* 1.3*   PROT 6.6 5.0* 5.0* 4.6* 4.0*  ALBUMIN 3.1* 2.6* 2.4* 2.4* 2.1*   Recent Labs  Lab 07/21/23 0700  LIPASE 27   No results for input(s): "AMMONIA" in the last 168 hours. Coagulation Profile: Recent Labs  Lab 07/22/23 0334  INR 1.0   Cardiac Enzymes: No results for input(s): "CKTOTAL", "CKMB", "CKMBINDEX", "TROPONINI" in the last 168 hours. BNP (last 3 results) No results for input(s): "PROBNP" in the last 8760 hours. HbA1C: No results for input(s): "HGBA1C" in the last 72 hours. CBG: No results for input(s): "GLUCAP" in the last 168 hours. Lipid Profile: No results for input(s): "CHOL", "HDL", "LDLCALC", "TRIG", "CHOLHDL", "LDLDIRECT" in the last 72 hours. Thyroid Function Tests: No results for input(s): "TSH", "T4TOTAL", "FREET4", "T3FREE", "THYROIDAB" in the last 72 hours. Anemia Panel: No results for input(s): "VITAMINB12", "FOLATE", "FERRITIN", "TIBC", "IRON", "RETICCTPCT" in the last 72 hours. Sepsis Labs: No results for input(s): "PROCALCITON", "LATICACIDVEN" in the last 168 hours.  Recent Results (from the past 240 hour(s))  SARS Coronavirus 2 by RT PCR (hospital order, performed in [facility] hospital lab) *cepheid single result test* Anterior Nasal Swab     Status: None   Collection Time: 07/21/23  7:49 AM   Specimen: Anterior  Nasal Swab  Result Value Ref Range Status   SARS Coronavirus 2 by RT PCR NEGATIVE NEGATIVE Final    Comment: (NOTE) SARS-CoV-2 target nucleic acids are NOT DETECTED.  The SARS-CoV-2 RNA is generally detectable in upper and lower respiratory specimens during the acute phase of infection. The lowest concentration of SARS-CoV-2 viral copies this assay can detect is 250 copies / mL. A negative result does not preclude SARS-CoV-2 infection and should not be used as the sole basis for treatment or other patient management decisions.  A negative result may occur with improper specimen collection / handling, submission of specimen other than  nasopharyngeal swab, presence of viral mutation(s) within the areas targeted by this assay, and inadequate number of viral copies (<250 copies / mL). A negative result must be combined with clinical observations, patient history, and epidemiological information.  Fact Sheet for Patients:   RoadLapTop.co.za  Fact Sheet for Healthcare Providers: http://kim-miller.com/  This test is not yet approved or  cleared by the [place] FDA and has been authorized for detection and/or diagnosis of SARS-CoV-2 by FDA under an Emergency Use Authorization (EUA).  This EUA will remain in effect (meaning this test can be used) for the duration of the COVID-19 declaration under Section 564(b)(1) of the Act, 21 U.S.C. section 360bbb-3(b)(1), unless the authorization is terminated or revoked sooner.  Performed at [facility], [address] [place], [place] [address]    Radiology Studies: CT HEAD WO CONTRAST ( )  Result Date: 07/24/2023 CLINICAL DATA:  47 year old female status post fall while inpatient. Dizziness. EXAM: CT HEAD WITHOUT CONTRAST TECHNIQUE: Contiguous axial images were obtained from the base of the skull through the vertex without intravenous contrast. RADIATION DOSE REDUCTION: This exam was performed according to the departmental dose-optimization program which includes automated exposure control, adjustment of the mA and/or kV according to patient size and/or use of iterative reconstruction technique. COMPARISON:  Brain MRI 01/31/2011.  Head CT 08/20/2011 and earlier. 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 No superimposed midline shift, ventriculomegaly, acute mass effect, intracranial hemorrhage or evidence of cortically based acute infarction. Gray-white matter differentiation is within normal limits throughout  the brain. Vascular: No suspicious intracranial vascular hyperdensity. Faint chronic basal ganglia vascular calcifications more apparent on the right, stable. Skull: Intact, negative. Sinuses/Orbits: Visualized paranasal sinuses and mastoids are clear. Other: No orbit or scalp soft tissue injury identified. IMPRESSION: 1. No acute intracranial abnormality or acute traumatic injury identified. 2. 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 Electronically Signed   By: Odessa Fleming M.D.   On: 07/24/2023 05:10    Scheduled Meds:  ARIPiprazole  2 mg Oral Daily   atorvastatin  10 mg Oral QHS   DULoxetine  60 mg Oral Daily   escitalopram  10 mg Oral Daily   linaclotide  290 mcg Oral QAC breakfast   lurasidone  20 mg Oral Daily   nortriptyline  50 mg Oral QHS   pantoprazole (PROTONIX) IV  40 mg Intravenous Q12H   senna-docusate  2 tablet Oral BID   sodium chloride flush  3 mL Intravenous Q12H   valACYclovir  1,000 mg Oral Daily   Continuous Infusions:  sodium chloride 125 mL/hr at 07/24/23 1603     LOS: 4 days    Time spent: 35 Mins    Willeen Niece, MD [facility]   If 7PM-7AM, please contact  night-coverage

## 2023-07-26 ENCOUNTER — Encounter (HOSPITAL_COMMUNITY): Payer: Self-pay | Admitting: Gastroenterology

## 2023-07-26 DIAGNOSIS — R7989 Other specified abnormal findings of blood chemistry: Secondary | ICD-10-CM | POA: Diagnosis not present

## 2023-07-26 LAB — CBC
HCT: 18.4 % — ABNORMAL LOW (ref 36.0–46.0)
Hemoglobin: 6.1 g/dL — CL (ref 12.0–15.0)
MCH: 30.5 pg (ref 26.0–34.0)
MCHC: 33.2 g/dL (ref 30.0–36.0)
MCV: 92 fL (ref 80.0–100.0)
Platelets: 229 10*3/uL (ref 150–400)
RBC: 2 MIL/uL — ABNORMAL LOW (ref 3.87–5.11)
RDW: 16.7 % — ABNORMAL HIGH (ref 11.5–15.5)
WBC: 12.1 10*3/uL — ABNORMAL HIGH (ref 4.0–10.5)
nRBC: 0 % (ref 0.0–0.2)

## 2023-07-26 LAB — PHOSPHORUS: Phosphorus: 3.5 mg/dL (ref 2.5–4.6)

## 2023-07-26 LAB — COMPREHENSIVE METABOLIC PANEL
ALT: 114 U/L — ABNORMAL HIGH (ref 0–44)
AST: 51 U/L — ABNORMAL HIGH (ref 15–41)
Albumin: 1.9 g/dL — ABNORMAL LOW (ref 3.5–5.0)
Alkaline Phosphatase: 116 U/L (ref 38–126)
Anion gap: 4 — ABNORMAL LOW (ref 5–15)
BUN: 19 mg/dL (ref 6–20)
CO2: 24 mmol/L (ref 22–32)
Calcium: 7.3 mg/dL — ABNORMAL LOW (ref 8.9–10.3)
Chloride: 107 mmol/L (ref 98–111)
Creatinine, Ser: 0.51 mg/dL (ref 0.44–1.00)
GFR, Estimated: >60 mL/min (ref >60)
Glucose, Bld: 107 mg/dL — ABNORMAL HIGH (ref 70–99)
Potassium: 3.6 mmol/L (ref 3.5–5.1)
Sodium: 135 mmol/L (ref 135–145)
Total Bilirubin: 1.2 mg/dL (ref 0.3–1.2)
Total Protein: 3.7 g/dL — ABNORMAL LOW (ref 6.5–8.1)

## 2023-07-26 LAB — MAGNESIUM: Magnesium: 1.8 mg/dL (ref 1.7–2.4)

## 2023-07-26 LAB — PREPARE RBC (CROSSMATCH)

## 2023-07-26 MED ORDER — SODIUM CHLORIDE 0.9% IV SOLUTION: Freq: Once | INTRAVENOUS | Status: AC

## 2023-07-26 MED ORDER — ZOLPIDEM TARTRATE 5 MG PO TABS
5.0000 mg | ORAL_TABLET | Freq: Once | ORAL | Status: AC
  Administered 2023-07-26: 5 mg via ORAL
  Filled 2023-07-26: qty 1

## 2023-07-26 NOTE — Progress Notes (Signed)
2nd Unit of blood stopped at 2101, unable to stop blood in the blood admin documentation due to a previous error in the documentation.

## 2023-07-26 NOTE — Plan of Care (Signed)

## 2023-07-26 NOTE — Progress Notes (Addendum)
[facility] Progress Note  SUBJECTIVE:   Interval history: Anna Serrano was seen and evaluated today at bedside. Resting comfortably in bed. Hgb drop to 6.1 this AM, blood transfusion pending. Denied abdominal pain. No nausea or vomiting. No further melena since yesterday. No chest pain or shortness of breath.   Past Medical History:  Diagnosis Date   Anxiety    Arachnoid cyst    Arthritis    Bipolar 1 disorder (HCC)    Chronic back pain    Depression    Fibroids    Fibromyalgia    Genital herpes    Hx of hysterectomy    Hypertension    Insomnia    Interstitial cystitis    Major depressive disorder    Migraine    gets "all the time"   Migraine    Ovarian cyst    PTSD (post-traumatic stress disorder)    Wolff-Parkinson-White (WPW) syndrome    Had EP study 11/20/2013 at [facility] that ruled out WPW syndrome.    Past Surgical History:  Procedure Laterality Date   ABDOMINAL HYSTERECTOMY     CARDIAC CATHETERIZATION     TOOTH EXTRACTION N/A 10/29/2014   Procedure: EXTRACTION OF TEETH #1, #16, #17 & #32;  Surgeon: Francene Finders, DDS;  Location: [facility] OR;  Service: Oral Surgery;  Laterality: N/A;   Current Facility-Administered Medications  Medication Dose Route Frequency Provider Last Rate Last Admin   0.9 %  sodium chloride infusion (Manually program via Guardrails IV Fluids)   Intravenous Once Willeen Niece, MD       0.9 %  sodium chloride infusion   Intravenous Continuous Vida Rigger, MD 125 mL/hr at 07/26/23 0209 New Bag at 07/26/23 0209   acetaminophen (TYLENOL) tablet 650 mg  650 mg Oral Q6H PRN Vida Rigger, MD   650 mg at 07/26/23 1610   Or   acetaminophen (TYLENOL) suppository 650 mg  650 mg Rectal Q6H PRN Vida Rigger, MD       ARIPiprazole (ABILIFY) tablet 2 mg  2 mg Oral Daily Vida Rigger, MD   2 mg at 07/25/23 9604   atorvastatin (LIPITOR) tablet 10 mg  10 mg Oral QHS Vida Rigger, MD   10 mg at 07/25/23 2126   cyclobenzaprine (FLEXERIL) tablet 10 mg  10  mg Oral TID PRN Vida Rigger, MD       DULoxetine (CYMBALTA) DR capsule 60 mg  60 mg Oral Daily Vida Rigger, MD   60 mg at 07/25/23 0934   escitalopram (LEXAPRO) tablet 10 mg  10 mg Oral Daily Vida Rigger, MD   10 mg at 07/25/23 0935   fentaNYL (SUBLIMAZE) injection 25-50 mcg  25-50 mcg Intravenous Q5 min PRN Kaylyn Layer, MD       linaclotide Karlene Einstein) capsule 290 mcg  290 mcg Oral QAC breakfast Vida Rigger, MD   290 mcg at 07/25/23 0803   lurasidone (LATUDA) tablet 20 mg  20 mg Oral Daily Vida Rigger, MD   20 mg at 07/25/23 0934   nortriptyline (PAMELOR) capsule 50 mg  50 mg Oral QHS Vida Rigger, MD   50 mg at 07/25/23 2125   ondansetron (ZOFRAN) injection 4 mg  4 mg Intravenous Q6H PRN Anthoney Harada, NP       oxyCODONE (Oxy IR/ROXICODONE) immediate release tablet 5 mg  5 mg Oral Q4H PRN Vida Rigger, MD   5 mg at 07/22/23 2120   pantoprazole (PROTONIX) injection 40 mg  40 mg Intravenous Q12H Lynann Bologna,  DO   40 mg at 07/25/23 2126   polyethylene glycol (MIRALAX / GLYCOLAX) packet 17 g  17 g Oral Daily PRN Vida Rigger, MD       promethazine (PHENERGAN) tablet 25 mg  25 mg Oral Q8H PRN Vida Rigger, MD       senna-docusate (Senokot-S) tablet 2 tablet  2 tablet Oral BID Vida Rigger, MD   2 tablet at 07/25/23 2125   sodium chloride flush (NS) 0.9 % injection 3 mL  3 mL Intravenous Q12H Vida Rigger, MD   3 mL at 07/25/23 2126   SUMAtriptan (IMITREX) tablet 50 mg  50 mg Oral Q2H PRN Vida Rigger, MD   50 mg at 07/24/23 1143   valACYclovir (VALTREX) tablet 1,000 mg  1,000 mg Oral Daily Vida Rigger, MD   1,000 mg at 07/25/23 0981   Allergies as of 07/21/2023 - Review Complete 07/21/2023  Allergen Reaction Noted   Gabapentin Other (See Comments) 08/02/2018   Topiramate Other (See Comments) 11/28/2021   Zonisamide Other (See Comments) 08/28/2021   Tizanidine Other (See Comments) 01/26/2023   Latex Rash 08/20/2011   Rimegepant Nausea And Vomiting and Other (See Comments) 04/15/2020    Review of Systems:  Review of Systems  Respiratory:  Negative for shortness of breath.   Cardiovascular:  Negative for chest pain.  Gastrointestinal:  Negative for abdominal pain, melena (since yesterday), nausea and vomiting.    OBJECTIVE:   Temp:  [97.6 F (36.4 C)-99 F (37.2 C)] 98.5 F (36.9 C) (09/02 0525) Pulse Rate:  [77-116] 98 (09/02 0525) Resp:  [16-17] 16 (09/02 0525) BP: (96-117)/(53-75) 97/53 (09/02 0525) SpO2:  [100 %] 100 % (09/02 0525) Last BM Date : 07/25/23 Physical Exam Constitutional:      General: She is not in acute distress.    Appearance: She is not ill-appearing, toxic-appearing or diaphoretic.  Cardiovascular:     Rate and Rhythm: Regular rhythm. Tachycardia present.  Pulmonary:     Effort: No respiratory distress.     Breath sounds: Normal breath sounds.  Abdominal:     General: Bowel sounds are normal. There is no distension.     Palpations: Abdomen is soft.     Tenderness: There is no abdominal tenderness. There is no guarding.  Skin:    General: Skin is warm and dry.     Comments: Midline abdomina; incision clean/dry.   Neurological:     Mental Status: She is alert.     Labs: Recent Labs    07/24/23 1210 07/25/23 0626 07/25/23 1316 07/26/23 0714  WBC 18.6* 13.3*  --  12.1*  HGB 6.3* 7.2* 7.5* 6.1*  HCT 19.2* 21.5* 22.4* 18.4*  PLT 358 252  --  229   BMET Recent Labs    07/24/23 1210 07/25/23 0626 07/26/23 0522  NA 137 138 135  K 3.4* 3.4* 3.6  CL 108 109 107  CO2 23 23 24   GLUCOSE 111* 107* 107*  BUN 30* 26* 19  CREATININE 0.70 0.73 0.51  CALCIUM 8.2* 7.6* 7.3*   LFT Recent Labs    07/25/23 0626 07/26/23 0522  PROT 4.0* 3.7*  ALBUMIN 2.1* 1.9*  AST 69* 51*  ALT 147* 114*  ALKPHOS 156* 116  BILITOT 1.3* 1.2  BILIDIR 0.5*  --   IBILI 0.8  --    PT/INR No results for input(s): "LABPROT", "INR" in the last 72 hours. Diagnostic imaging: No results found.  IMPRESSION: Melena Acute blood loss  anemia Choledocholithiasis             -  S/p ERCP 07/23/23 with sphincterotomy and balloon sweep with Dr. Ewing Schlein Abdominal pain, improved  Transaminase elevation in mixed pattern  History gastric sleeve History panniculectomy on 07/12/23 Chronic constipation   PLAN: -Hgb drop, pending blood transfusion, no further melena since yesterday, Lovenox discontinued and IV PPI started -Trend H/H after transfusion -Continue IV PPI -Maintain NPO pending formal recommendations from advanced endoscopy, Dr. Marina Goodell -Further recommendations to follow   LOS: 5 days   Liliane Shi, DO [facility]  Update 10:38 AM -Per discussion with Dr. Marina Goodell, with no further melena and BUN down, suspect bleeding has stopped. Ok to continue to hold Lovenox, continue IV PPI, transfuse 2 units PRBC -No plan for procedure today -Ok for clear liquid diet today -[facility] will follow, Dr. Bosie Clos tomorrow  Liliane Shi, DO [facility]

## 2023-07-26 NOTE — Anesthesia Postprocedure Evaluation (Signed)
Anesthesia Post Note  Patient: Anna Serrano  Procedure(s) Performed: ENDOSCOPIC RETROGRADE CHOLANGIOPANCREATOGRAPHY (ERCP) SPHINCTEROTOMY REMOVAL OF STONES     Patient location during evaluation: PACU Anesthesia Type: General Level of consciousness: awake and alert Pain management: pain level controlled Vital Signs Assessment: post-procedure vital signs reviewed and stable Respiratory status: spontaneous breathing, nonlabored ventilation and respiratory function stable Cardiovascular status: blood pressure returned to baseline Postop Assessment: no apparent nausea or vomiting Anesthetic complications: no   No notable events documented.      Shanda Howells

## 2023-07-26 NOTE — Progress Notes (Signed)
PROGRESS NOTE    Anna Serrano  FAO:[id number] DOB: Aug 12, 1976 DOA: 07/21/2023 PCP: Macy Mis, MD   Brief Narrative:  This 47 years old female with PMH significant for recent panniculectomy 2 days ago, presented in the ED with abdominal discomfort associated with nausea and chills.  Patient reports she was doing well after surgery and was able to tolerate diet however last evening She started having abdominal discomfort and has developed chills and nausea,  denies any vomiting.  Patient reports having intermittent abdominal discomfort for last 3 months which was self resolving.  Imaging in the ED shows choledocholithiasis.  Patient is admitted for further management.  GI is consulted.  MRCP consistent with choledocholithiasis patient underwent ERCP.  General surgery consulted for possible cholecystectomy.  General surgery recommended outpatient follow-up to plan cholecystectomy when she is recovering from panniculectomy or abdominoplasty.  Assessment & Plan:   Principal Problem:   Elevated LFTs Active Problems:   MDD (major depressive disorder), recurrent episode, moderate (HCC)   Blood pressure check   Dyslipidemia   Obstructive jaundice  Choledocholithiasis: Patient presented with worsening abdominal pain after panniculectomy. She reports episodic abdominal pain that has been happening for last 3 months. Workup in the ED showed elevated LFTs with mixed/cholestatic pattern. CT Abdomen showed CBD dilatation. There is high suspicion of choledocholithiasis. MRCP confirmed CBD stones.  GI is consulted. Patient underwent ERCP, gallstone successfully removed. Continue adequate pain control. General surgery consulted recommended outpatient follow-up to plan cholecystectomy when she fully recovered from panniculectomy or abdominoplasty.  Hyperlipidemia: Continue Lipitor.  Major depression severe recurrent without psychosis. Continue antipsychotic and antidepressant  medications. Continue Cymbalta, Latuda, Abilify, nortriptyline.  Elevated liver enzymes: Secondary to above. Continue to monitor. LFTS improving.  Acute blood loss anemia: Hemoglobin dropped to 10.9 > 8.8> 6.3 > 7.2 > 7.5 > 6.1 There is no obvious visible bleeding. S/p Transfuse 2 unit PRBC. Hb 7.5 Hb dropped again to 6.1 , transfuse 2 units PRBC. Melena and anemia after ERCP raises suspicion for post sphincterectomy bleeding. GI is aware.  NPO Monday night for possible procedure on Tuesday.  Hypokalemia / Hypomagnesemia: Replaced.  Continue to monitor.  Vasovagal syncope: Could be secondary to orthostatic hypotension due to anemia. Transfuse 2 units PRBC Patient reports feeling better,  going to the bathroom without any dizziness.   DVT prophylaxis: Lovenox Code Status:Full code Family Communication: No family at bed side Disposition Plan:   Status is: Inpatient Remains inpatient appropriate because: Patient admitted for choledocholithiasis consistent on MRCP, She underwent ERCP.  Cholecystectomy is delayed until patient fully recovers from abdominoplasty.   Consultants:  Gastroenterology General Surgery  Procedures:ERCP in am  Antimicrobials:  Anti-infectives (From admission, onward)    Start     Dose/Rate Route Frequency Ordered Stop   07/21/23 2030  valACYclovir (VALTREX) tablet 1,000 mg        1,000 mg Oral Daily 07/21/23 1941        Subjective: Patient was seen and examined at bedside.  Overnight events noted. Patient reports doing better. She denies any further dizziness or lightheadedness. She has been going to bathroom without any symptoms.   Objective: Vitals:   07/26/23 0525 07/26/23 1031 07/26/23 1112 07/26/23 1138  BP: (!) 97/53 108/60 107/66 97/61  Pulse: 98 99 (!) 103 (!) 102  Resp: 16  20 20   Temp: 98.5 F (36.9 C) 97.7 F (36.5 C) 97.8 F (36.6 C) 98.2 F (36.8 C)  TempSrc:   Oral   SpO2: 100% 100%  100% 100%  Weight:      Height:         Intake/Output Summary (Last 24 hours) at 07/26/2023 1332 Last data filed at 07/26/2023 0615 Gross per 24 hour  Intake 270 ml  Output 25 ml  Net 245 ml   Filed Weights   07/21/23 0655  Weight: 74.8 kg    Examination:  General exam: Appears comfortable, deconditioned, not in any acute distress. Respiratory system: CTA bilaterally . Respiratory effort normal.  RR 14 Cardiovascular system: S1 & S2 heard, RRR. No JVD, murmurs, rubs, gallops or clicks. No pedal edema. Gastrointestinal system: Abdomen is soft, mildly tender, non distended, normal bowel sounds heard. Central nervous system: Alert and oriented x 3. No focal neurological deficits. Extremities: No edema, no cyanosis, no clubbing. Skin: No rashes, lesions or ulcers Psychiatry: Judgement and insight appear normal. Mood & affect appropriate.     Data Reviewed: I have personally reviewed following labs and imaging studies  CBC: Recent Labs  Lab 07/21/23 0700 07/22/23 0334 07/23/23 0339 07/24/23 1210 07/25/23 0626 07/25/23 1316 07/26/23 0714  WBC 7.4 6.9 6.8 18.6* 13.3*  --  12.1*  NEUTROABS 4.5  --   --  14.1* 8.7*  --   --   HGB 10.9* 8.8* 8.4* 6.3* 7.2* 7.5* 6.1*  HCT 32.4* 26.9* 25.4* 19.2* 21.5* 22.4* 18.4*  MCV 91.3 94.1 93.7 96.0 89.2  --  92.0  PLT 452* 354 351 358 252  --  229   Basic Metabolic Panel: Recent Labs  Lab 07/22/23 0334 07/23/23 0339 07/24/23 1210 07/25/23 0626 07/26/23 0522  NA 138 136 137 138 135  K 3.8 3.5 3.4* 3.4* 3.6  CL 107 107 108 109 107  CO2 23 24 23 23 24   GLUCOSE 94 97 111* 107* 107*  BUN 10 6 30* 26* 19  CREATININE 0.75 0.68 0.70 0.73 0.51  CALCIUM 8.3* 8.0* 8.2* 7.6* 7.3*  MG  --  1.7 1.5* 1.5* 1.8  PHOS  --  3.9 2.6 3.4 3.5   GFR: Estimated Creatinine Clearance: 86.1 mL/min (by C-G formula based on SCr of 0.51 mg/dL). Liver Function Tests: Recent Labs  Lab 07/22/23 0334 07/23/23 0339 07/24/23 1210 07/25/23 0626 07/26/23 0522  AST 254* 207* 105* 69* 51*   ALT 303* 257* 205* 147* 114*  ALKPHOS 389* 382* 241* 156* 116  BILITOT 3.9* 3.5* 1.5* 1.3* 1.2  PROT 5.0* 5.0* 4.6* 4.0* 3.7*  ALBUMIN 2.6* 2.4* 2.4* 2.1* 1.9*   Recent Labs  Lab 07/21/23 0700  LIPASE 27   No results for input(s): "AMMONIA" in the last 168 hours. Coagulation Profile: Recent Labs  Lab 07/22/23 0334  INR 1.0   Cardiac Enzymes: No results for input(s): "CKTOTAL", "CKMB", "CKMBINDEX", "TROPONINI" in the last 168 hours. BNP (last 3 results) No results for input(s): "PROBNP" in the last 8760 hours. HbA1C: No results for input(s): "HGBA1C" in the last 72 hours. CBG: No results for input(s): "GLUCAP" in the last 168 hours. Lipid Profile: No results for input(s): "CHOL", "HDL", "LDLCALC", "TRIG", "CHOLHDL", "LDLDIRECT" in the last 72 hours. Thyroid Function Tests: No results for input(s): "TSH", "T4TOTAL", "FREET4", "T3FREE", "THYROIDAB" in the last 72 hours. Anemia Panel: No results for input(s): "VITAMINB12", "FOLATE", "FERRITIN", "TIBC", "IRON", "RETICCTPCT" in the last 72 hours. Sepsis Labs: No results for input(s): "PROCALCITON", "LATICACIDVEN" in the last 168 hours.  Recent Results (from the past 240 hour(s))  SARS Coronavirus 2 by RT PCR (hospital order, performed in [facility] hospital lab) *cepheid single  result test* Anterior Nasal Swab     Status: None   Collection Time: 07/21/23  7:49 AM   Specimen: Anterior Nasal Swab  Result Value Ref Range Status   SARS Coronavirus 2 by RT PCR NEGATIVE NEGATIVE Final    Comment: (NOTE) SARS-CoV-2 target nucleic acids are NOT DETECTED.  The SARS-CoV-2 RNA is generally detectable in upper and lower respiratory specimens during the acute phase of infection. The lowest concentration of SARS-CoV-2 viral copies this assay can detect is 250 copies / mL. A negative result does not preclude SARS-CoV-2 infection and should not be used as the sole basis for treatment or other patient management decisions.  A negative  result may occur with improper specimen collection / handling, submission of specimen other than nasopharyngeal swab, presence of viral mutation(s) within the areas targeted by this assay, and inadequate number of viral copies (<250 copies / mL). A negative result must be combined with clinical observations, patient history, and epidemiological information.  Fact Sheet for Patients:   RoadLapTop.co.za  Fact Sheet for Healthcare Providers: http://kim-miller.com/  This test is not yet approved or  cleared by the [place] FDA and has been authorized for detection and/or diagnosis of SARS-CoV-2 by FDA under an Emergency Use Authorization (EUA).  This EUA will remain in effect (meaning this test can be used) for the duration of the COVID-19 declaration under Section 564(b)(1) of the Act, 21 U.S.C. section 360bbb-3(b)(1), unless the authorization is terminated or revoked sooner.  Performed at [facility], [address], [place], [place] [address]    Radiology Studies: No results found.  Scheduled Meds:  ARIPiprazole  2 mg Oral Daily   atorvastatin  10 mg Oral QHS   DULoxetine  60 mg Oral Daily   escitalopram  10 mg Oral Daily   linaclotide  290 mcg Oral QAC breakfast   lurasidone  20 mg Oral Daily   nortriptyline  50 mg Oral QHS   pantoprazole (PROTONIX) IV  40 mg Intravenous Q12H   senna-docusate  2 tablet Oral BID   sodium chloride flush  3 mL Intravenous Q12H   valACYclovir  1,000 mg Oral Daily   Continuous Infusions:  sodium chloride 125 mL/hr at 07/26/23 0209     LOS: 5 days    Time spent: 35 Mins    Willeen Niece, MD [facility]   If 7PM-7AM, please contact night-coverage

## 2023-07-26 NOTE — Progress Notes (Signed)
Date and time results received: 07/26/23 0754 Test: Hemoglobin Critical Value: 6.1  Name of Provider Notified: Willeen Niece, MD  Orders Received? Or Actions Taken?: Orders Received - See Orders for details Blood transfusion ordered.

## 2023-07-27 DIAGNOSIS — R7989 Other specified abnormal findings of blood chemistry: Secondary | ICD-10-CM | POA: Diagnosis not present

## 2023-07-27 LAB — BPAM RBC
Blood Product Expiration Date: 202409252359
Blood Product Expiration Date: 202409252359
Blood Product Expiration Date: 202409252359
Blood Product Expiration Date: 202409252359
Blood Product Expiration Date: 202409262359
ISSUE DATE / TIME: 202408311758
ISSUE DATE / TIME: 202408312200
ISSUE DATE / TIME: 202409021118
ISSUE DATE / TIME: 202409021608
ISSUE DATE / TIME: 202409021756
Unit Type and Rh: 5100
Unit Type and Rh: 5100
Unit Type and Rh: 5100
Unit Type and Rh: 5100
Unit Type and Rh: 5100

## 2023-07-27 LAB — TYPE AND SCREEN
ABO/RH(D): O POS
Antibody Screen: NEGATIVE
Unit division: 0
Unit division: 0
Unit division: 0
Unit division: 0
Unit division: 0

## 2023-07-27 LAB — BASIC METABOLIC PANEL
Anion gap: 6 (ref 5–15)
BUN: 8 mg/dL (ref 6–20)
CO2: 24 mmol/L (ref 22–32)
Calcium: 8 mg/dL — ABNORMAL LOW (ref 8.9–10.3)
Chloride: 105 mmol/L (ref 98–111)
Creatinine, Ser: 0.64 mg/dL (ref 0.44–1.00)
GFR, Estimated: >60 mL/min (ref >60)
Glucose, Bld: 103 mg/dL — ABNORMAL HIGH (ref 70–99)
Potassium: 3.1 mmol/L — ABNORMAL LOW (ref 3.5–5.1)
Sodium: 135 mmol/L (ref 135–145)

## 2023-07-27 LAB — CBC
HCT: 27.5 % — ABNORMAL LOW (ref 36.0–46.0)
Hemoglobin: 9.1 g/dL — ABNORMAL LOW (ref 12.0–15.0)
MCH: 29.9 pg (ref 26.0–34.0)
MCHC: 33.1 g/dL (ref 30.0–36.0)
MCV: 90.5 fL (ref 80.0–100.0)
Platelets: 235 10*3/uL (ref 150–400)
RBC: 3.04 MIL/uL — ABNORMAL LOW (ref 3.87–5.11)
RDW: 17 % — ABNORMAL HIGH (ref 11.5–15.5)
WBC: 8.6 10*3/uL (ref 4.0–10.5)
nRBC: 0 % (ref 0.0–0.2)

## 2023-07-27 MED ORDER — POTASSIUM CHLORIDE 10 MEQ/100ML IV SOLN
10.0000 meq | INTRAVENOUS | Status: DC
  Administered 2023-07-27: 10 meq via INTRAVENOUS
  Filled 2023-07-27: qty 100

## 2023-07-27 MED ORDER — POTASSIUM CHLORIDE 10 MEQ/100ML IV SOLN
10.0000 meq | INTRAVENOUS | Status: AC
  Administered 2023-07-27 (×2): 10 meq via INTRAVENOUS
  Filled 2023-07-27 (×2): qty 100

## 2023-07-27 MED ORDER — POTASSIUM CHLORIDE CRYS ER 10 MEQ PO TBCR
40.0000 meq | EXTENDED_RELEASE_TABLET | Freq: Once | ORAL | Status: AC
  Administered 2023-07-27: 40 meq via ORAL
  Filled 2023-07-27: qty 4

## 2023-07-27 MED ORDER — POTASSIUM CHLORIDE 20 MEQ PO PACK: 40.0000 meq | PACK | Freq: Once | ORAL | Status: DC

## 2023-07-27 NOTE — TOC Progression Note (Signed)
Transition of Care [facility]) - Progression Note    Patient Details  Name: Anna Serrano MRN: [id number] Date of Birth: 10/08/1976  Transition of Care [facility]) CM/SW Contact  Darleene Cleaver, [place] Phone Number: 07/27/2023, 11:14 AM  Clinical Narrative:     CSW was informed by bedside nurse that patient wanted to speak to [facility].  CSW spoke to patient, and she was asking how to have medical records sent to her employer.  CSW informed her that she should be able to access it through My Chart.  CSW provided her contact information for My Chart help and also Medical Records phone number.  Per patient she was appreciative of information given.  Patient did not express any other concerns or issues.        Expected Discharge Plan and Services                                               Social Determinants of Health (SDOH) Interventions SDOH Screenings   Food Insecurity: No Food Insecurity (07/21/2023)  Housing: Low Risk  (07/21/2023)  Transportation Needs: No Transportation Needs (07/21/2023)  Utilities: Not At Risk (07/21/2023)  Alcohol Screen: Low Risk  (12/10/2018)  Financial Resource Strain: High Risk (12/16/2022)   Received from [facility]  Physical Activity: Inactive (02/09/2022)   Received from [facility], [facility]  Social Connections: Unknown (03/23/2022)   Received from [facility], [facility]  Stress: Stress Concern Present (02/09/2022)   Received from [facility], [facility]  Tobacco Use: Low Risk  (07/23/2023)    Readmission Risk Interventions     No data to display

## 2023-07-27 NOTE — H&P (View-Only) (Signed)
[facility] Gastroenterology Progress Note  Anna Serrano 47 y.o. 10-24-1976   Subjective: Black stool this morning. Denies abdominal pain.  Objective: Vital signs: Vitals:   07/27/23 0505 07/27/23 0506  BP: (!) 131/93 130/89  Pulse: (!) 109 (!) 108  Resp: 16 18  Temp: 97.9 F (36.6 C) 97.9 F (36.6 C)  SpO2: 100% 92%    Physical Exam: Gen: alert, no acute distress, well-nourished, pleasant HEENT: anicteric sclera CV: RRR Chest: CTA B Abd: midline surgical incision tenderness, soft, nondistended, +BS Ext: no edema  Lab Results: Recent Labs    07/25/23 0626 07/26/23 0522 07/27/23 0508  NA 138 135 135  K 3.4* 3.6 3.1*  CL 109 107 105  CO2 23 24 24   GLUCOSE 107* 107* 103*  BUN 26* 19 8  CREATININE 0.73 0.51 0.64  CALCIUM 7.6* 7.3* 8.0*  MG 1.5* 1.8  --   PHOS 3.4 3.5  --    Recent Labs    07/25/23 0626 07/26/23 0522  AST 69* 51*  ALT 147* 114*  ALKPHOS 156* 116  BILITOT 1.3* 1.2  PROT 4.0* 3.7*  ALBUMIN 2.1* 1.9*   Recent Labs    07/24/23 1210 07/25/23 0626 07/25/23 1316 07/26/23 0714  WBC 18.6* 13.3*  --  12.1*  NEUTROABS 14.1* 8.7*  --   --   HGB 6.3* 7.2* 7.5* 6.1*  HCT 19.2* 21.5* 22.4* 18.4*  MCV 96.0 89.2  --  92.0  PLT 358 252  --  229      Assessment/Plan: Melenic stools following ERCP with sphincterotomy on 8/30. 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 Clear liquid diet. NPO p MN. Supportive care.   Shirley Friar 07/27/2023, 11:50 AM  Questions please call [phone] ID: Anna Serrano, female   DOB: [birth year], 47 y.o.   MRN: [id number]

## 2023-07-27 NOTE — Care Management Important Message (Signed)
Important Message  Patient Details IM Letter given. Name: Anna Serrano MRN: [id number] Date of Birth: [birth year]   Medicare Important Message Given:  Yes     Caren Macadam 07/27/2023, 12:46 PM

## 2023-07-27 NOTE — Plan of Care (Signed)
Alert and oriented. Labs rechecked after transfusion. Plan for EGD and ERCP tomorrow. Educated on plan of care.   Problem: Education: Goal: Knowledge of General Education information will improve Description: Including pain rating scale, medication(s)/side effects and non-pharmacologic comfort measures Outcome: Progressing   Problem: Health Behavior/Discharge Planning: Goal: Ability to manage health-related needs will improve Outcome: Progressing   Problem: Clinical Measurements: Goal: Ability to maintain clinical measurements within normal limits will improve Outcome: Progressing Goal: Will remain free from infection Outcome: Progressing Goal: Diagnostic test results will improve Outcome: Progressing Goal: Respiratory complications will improve Outcome: Progressing Goal: Cardiovascular complication will be avoided Outcome: Progressing   Problem: Activity: Goal: Risk for activity intolerance will decrease Outcome: Progressing   Problem: Nutrition: Goal: Adequate nutrition will be maintained Outcome: Progressing   Problem: Coping: Goal: Level of anxiety will decrease Outcome: Progressing   Problem: Elimination: Goal: Will not experience complications related to bowel motility Outcome: Progressing Goal: Will not experience complications related to urinary retention Outcome: Progressing   Problem: Pain Managment: Goal: General experience of comfort will improve Outcome: Progressing   Problem: Safety: Goal: Ability to remain free from injury will improve Outcome: Progressing   Problem: Skin Integrity: Goal: Risk for impaired skin integrity will decrease Outcome: Progressing

## 2023-07-27 NOTE — Progress Notes (Signed)
Mobility Specialist - Progress Note   07/27/23 1000  Mobility  Activity Ambulated with assistance in hallway  Level of Assistance Modified independent, requires aide device or extra time  Assistive Device None  Distance Ambulated (ft) 500 ft  Range of Motion/Exercises Active  Activity Response Tolerated well  Mobility Referral Yes  $Mobility charge 1 Mobility  Mobility Specialist Start Time (ACUTE ONLY) 1015  Mobility Specialist Stop Time (ACUTE ONLY) 1027  Mobility Specialist Time Calculation (min) (ACUTE ONLY) 12 min   Pt received in bed and agreed to mobility, had no issues throughout session and returned to bed with all needs met.  Anna Serrano Mobility Specialist

## 2023-07-27 NOTE — Progress Notes (Signed)
Patient states she is unable to drink the ordered potassium chloride packets. She states that she was unable to earlier in the admission as well. Dr. Idelle Leech Attending notified and unit Pharmacist Marcelino Duster.  Awaiting new orders.

## 2023-07-27 NOTE — Progress Notes (Signed)
PROGRESS NOTE    Anna Serrano  ION:[id number] DOB: March 11, 1976 DOA: 07/21/2023 PCP: Macy Mis, MD   Brief Narrative:  This 47 years old female with PMH significant for recent panniculectomy 2 days ago, presented in the ED with abdominal discomfort associated with nausea and chills.  Patient reports she was doing well after surgery and was able to tolerate diet however last evening She started having abdominal discomfort and has developed chills and nausea,  denies any vomiting.  Patient reports having intermittent abdominal discomfort for last 3 months which was self resolving.  Imaging in the ED shows choledocholithiasis.  Patient is admitted for further management.  GI is consulted.  MRCP consistent with choledocholithiasis,  Patient underwent ERCP.  General surgery consulted for possible cholecystectomy.  General surgery recommended outpatient follow-up to plan cholecystectomy when she is recovering from panniculectomy or abdominoplasty.  Assessment & Plan:   Principal Problem:   Elevated LFTs Active Problems:   MDD (major depressive disorder), recurrent episode, moderate (HCC)   Blood pressure check   Dyslipidemia   Obstructive jaundice  Choledocholithiasis: Patient presented with worsening abdominal pain after panniculectomy. She reports episodic abdominal pain that has been happening for last 3 months. Workup in the ED showed elevated LFTs with mixed/cholestatic pattern. CT Abdomen showed CBD dilatation. There is high suspicion of choledocholithiasis. MRCP confirmed CBD stones.  GI is consulted. Patient underwent ERCP, gallstone successfully removed. Continue adequate pain control. General surgery consulted recommended outpatient follow-up to plan cholecystectomy when she fully recovered from panniculectomy or abdominoplasty.  Hyperlipidemia: Continue Lipitor.  Major depression severe recurrent without psychosis. Continue antipsychotic and antidepressant  medications. Continue Cymbalta, Latuda, Abilify, nortriptyline.  Elevated liver enzymes: Secondary to above. Continue to monitor. LFTS improving.  Acute blood loss anemia: Melena : Hemoglobin dropped from 10.9 > 8.8> 6.3 S/p Transfuse 2 unit PRBC. Hb 7.5 Hb dropped again to 6.1 , transfuse 2 units PRBC.  GI is Melena and anemia after ERCP raises suspicion for post sphincterectomy bleeding. GI Consulted.  EGD tomorrow afternoon and if ampullary bleeding noted,  will need ERCP as well. Monitor H&H.  Hypokalemia / Hypomagnesemia: Replaced.  Continue to monitor.  Vasovagal syncope: Could be secondary to orthostatic hypotension due to anemia. S/p Transfuse 2 units PRBC. Hb improved. Patient reports feeling better,  going to the bathroom without any dizziness.   DVT prophylaxis: Lovenox Code Status:Full code Family Communication: No family at bed side Disposition Plan:   Status is: Inpatient Remains inpatient appropriate because: Patient admitted for choledocholithiasis consistent on MRCP, She underwent ERCP.  Cholecystectomy is delayed until patient fully recovers from abdominoplasty.   Consultants:  Gastroenterology General Surgery  Procedures:ERCP in am  Antimicrobials:  Anti-infectives (From admission, onward)    Start     Dose/Rate Route Frequency Ordered Stop   07/21/23 2030  valACYclovir (VALTREX) tablet 1,000 mg        1,000 mg Oral Daily 07/21/23 1941        Subjective: Patient was seen and examined at bedside.  Overnight events noted. Patient reports doing better.  She denies any further dizziness or lightheadedness. She is scheduled for EGD possible ERCP tomorrow.   Objective: Vitals:   07/26/23 2139 07/26/23 2335 07/27/23 0505 07/27/23 0506  BP: 116/79 124/77 (!) 131/93 130/89  Pulse: 85 98 (!) 109 (!) 108  Resp: 18 18 16 18   Temp: 99 F (37.2 C) 97.9 F (36.6 C) 97.9 F (36.6 C) 97.9 F (36.6 C)  TempSrc: Oral Oral Oral  SpO2: 100% 100% 100%  92%  Weight:      Height:        Intake/Output Summary (Last 24 hours) at 07/27/2023 1531 Last data filed at 07/27/2023 1112 Gross per 24 hour  Intake 286.74 ml  Output 80 ml  Net 206.74 ml   Filed Weights   07/21/23 0655  Weight: 74.8 kg    Examination:  General exam: Appears comfortable, deconditioned, not in any acute distress. Respiratory system: CTA bilaterally . Respiratory effort normal.  RR 13 Cardiovascular system: S1 & S2 heard, RRR. No JVD, murmurs, rubs, gallops or clicks. No pedal edema. Gastrointestinal system: Abdomen is soft, mildly tender, non distended, normal bowel sounds heard. Central nervous system: Alert and oriented x 3. No focal neurological deficits. Extremities: No edema, no cyanosis, no clubbing. Skin: No rashes, lesions or ulcers Psychiatry: Judgement and insight appear normal. Mood & affect appropriate.     Data Reviewed: I have personally reviewed following labs and imaging studies  CBC: Recent Labs  Lab 07/21/23 0700 07/22/23 0334 07/23/23 0339 07/24/23 1210 07/25/23 0626 07/25/23 1316 07/26/23 0714  WBC 7.4 6.9 6.8 18.6* 13.3*  --  12.1*  NEUTROABS 4.5  --   --  14.1* 8.7*  --   --   HGB 10.9* 8.8* 8.4* 6.3* 7.2* 7.5* 6.1*  HCT 32.4* 26.9* 25.4* 19.2* 21.5* 22.4* 18.4*  MCV 91.3 94.1 93.7 96.0 89.2  --  92.0  PLT 452* 354 351 358 252  --  229   Basic Metabolic Panel: Recent Labs  Lab 07/23/23 0339 07/24/23 1210 07/25/23 0626 07/26/23 0522 07/27/23 0508  NA 136 137 138 135 135  K 3.5 3.4* 3.4* 3.6 3.1*  CL 107 108 109 107 105  CO2 24 23 23 24 24   GLUCOSE 97 111* 107* 107* 103*  BUN 6 30* 26* 19 8  CREATININE 0.68 0.70 0.73 0.51 0.64  CALCIUM 8.0* 8.2* 7.6* 7.3* 8.0*  MG 1.7 1.5* 1.5* 1.8  --   PHOS 3.9 2.6 3.4 3.5  --    GFR: Estimated Creatinine Clearance: 86.1 mL/min (by C-G formula based on SCr of 0.64 mg/dL). Liver Function Tests: Recent Labs  Lab 07/22/23 0334 07/23/23 0339 07/24/23 1210 07/25/23 0626  07/26/23 0522  AST 254* 207* 105* 69* 51*  ALT 303* 257* 205* 147* 114*  ALKPHOS 389* 382* 241* 156* 116  BILITOT 3.9* 3.5* 1.5* 1.3* 1.2  PROT 5.0* 5.0* 4.6* 4.0* 3.7*  ALBUMIN 2.6* 2.4* 2.4* 2.1* 1.9*   Recent Labs  Lab 07/21/23 0700  LIPASE 27   No results for input(s): "AMMONIA" in the last 168 hours. Coagulation Profile: Recent Labs  Lab 07/22/23 0334  INR 1.0   Cardiac Enzymes: No results for input(s): "CKTOTAL", "CKMB", "CKMBINDEX", "TROPONINI" in the last 168 hours. BNP (last 3 results) No results for input(s): "PROBNP" in the last 8760 hours. HbA1C: No results for input(s): "HGBA1C" in the last 72 hours. CBG: No results for input(s): "GLUCAP" in the last 168 hours. Lipid Profile: No results for input(s): "CHOL", "HDL", "LDLCALC", "TRIG", "CHOLHDL", "LDLDIRECT" in the last 72 hours. Thyroid Function Tests: No results for input(s): "TSH", "T4TOTAL", "FREET4", "T3FREE", "THYROIDAB" in the last 72 hours. Anemia Panel: No results for input(s): "VITAMINB12", "FOLATE", "FERRITIN", "TIBC", "IRON", "RETICCTPCT" in the last 72 hours. Sepsis Labs: No results for input(s): "PROCALCITON", "LATICACIDVEN" in the last 168 hours.  Recent Results (from the past 240 hour(s))  SARS Coronavirus 2 by RT PCR (hospital order, performed in [facility] hospital  lab) *cepheid single result test* Anterior Nasal Swab     Status: None   Collection Time: 07/21/23  7:49 AM   Specimen: Anterior Nasal Swab  Result Value Ref Range Status   SARS Coronavirus 2 by RT PCR NEGATIVE NEGATIVE Final    Comment: (NOTE) SARS-CoV-2 target nucleic acids are NOT DETECTED.  The SARS-CoV-2 RNA is generally detectable in upper and lower respiratory specimens during the acute phase of infection. The lowest concentration of SARS-CoV-2 viral copies this assay can detect is 250 copies / mL. A negative result does not preclude SARS-CoV-2 infection and should not be used as the sole basis for treatment or  other patient management decisions.  A negative result may occur with improper specimen collection / handling, submission of specimen other than nasopharyngeal swab, presence of viral mutation(s) within the areas targeted by this assay, and inadequate number of viral copies (<250 copies / mL). A negative result must be combined with clinical observations, patient history, and epidemiological information.  Fact Sheet for Patients:   RoadLapTop.co.za  Fact Sheet for Healthcare Providers: http://kim-miller.com/  This test is not yet approved or  cleared by the [place] FDA and has been authorized for detection and/or diagnosis of SARS-CoV-2 by FDA under an Emergency Use Authorization (EUA).  This EUA will remain in effect (meaning this test can be used) for the duration of the COVID-19 declaration under Section 564(b)(1) of the Act, 21 U.S.C. section 360bbb-3(b)(1), unless the authorization is terminated or revoked sooner.  Performed at [facility], [address], [place], [place] [address]    Radiology Studies: No results found.  Scheduled Meds:  ARIPiprazole  2 mg Oral Daily   atorvastatin  10 mg Oral QHS   DULoxetine  60 mg Oral Daily   escitalopram  10 mg Oral Daily   linaclotide  290 mcg Oral QAC breakfast   lurasidone  20 mg Oral Daily   nortriptyline  50 mg Oral QHS   pantoprazole (PROTONIX) IV  40 mg Intravenous Q12H   potassium chloride  40 mEq Oral Once   senna-docusate  2 tablet Oral BID   sodium chloride flush  3 mL Intravenous Q12H   valACYclovir  1,000 mg Oral Daily   Continuous Infusions:  sodium chloride 10 mL/hr at 07/27/23 1056   potassium chloride 10 mEq (07/27/23 1425)     LOS: 6 days    Time spent: 35 Mins    Willeen Niece, MD [facility]   If 7PM-7AM, please contact night-coverage

## 2023-07-27 NOTE — Progress Notes (Signed)
[facility] Gastroenterology Progress Note  Anna Serrano 47 y.o. 10-24-1976   Subjective: Black stool this morning. Denies abdominal pain.  Objective: Vital signs: Vitals:   07/27/23 0505 07/27/23 0506  BP: (!) 131/93 130/89  Pulse: (!) 109 (!) 108  Resp: 16 18  Temp: 97.9 F (36.6 C) 97.9 F (36.6 C)  SpO2: 100% 92%    Physical Exam: Gen: alert, no acute distress, well-nourished, pleasant HEENT: anicteric sclera CV: RRR Chest: CTA B Abd: midline surgical incision tenderness, soft, nondistended, +BS Ext: no edema  Lab Results: Recent Labs    07/25/23 0626 07/26/23 0522 07/27/23 0508  NA 138 135 135  K 3.4* 3.6 3.1*  CL 109 107 105  CO2 23 24 24   GLUCOSE 107* 107* 103*  BUN 26* 19 8  CREATININE 0.73 0.51 0.64  CALCIUM 7.6* 7.3* 8.0*  MG 1.5* 1.8  --   PHOS 3.4 3.5  --    Recent Labs    07/25/23 0626 07/26/23 0522  AST 69* 51*  ALT 147* 114*  ALKPHOS 156* 116  BILITOT 1.3* 1.2  PROT 4.0* 3.7*  ALBUMIN 2.1* 1.9*   Recent Labs    07/24/23 1210 07/25/23 0626 07/25/23 1316 07/26/23 0714  WBC 18.6* 13.3*  --  12.1*  NEUTROABS 14.1* 8.7*  --   --   HGB 6.3* 7.2* 7.5* 6.1*  HCT 19.2* 21.5* 22.4* 18.4*  MCV 96.0 89.2  --  92.0  PLT 358 252  --  229      Assessment/Plan: Melenic stools following ERCP with sphincterotomy on 8/30. 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 Clear liquid diet. NPO p MN. Supportive care.   Shirley Friar 07/27/2023, 11:50 AM  Questions please call [phone] ID: Anna Serrano, female   DOB: [birth year], 47 y.o.   MRN: [id number]

## 2023-07-28 ENCOUNTER — Inpatient Hospital Stay (HOSPITAL_COMMUNITY): Payer: Medicare Other | Admitting: Anesthesiology

## 2023-07-28 ENCOUNTER — Encounter (HOSPITAL_COMMUNITY)
Admission: EM | Disposition: A | Discharge status: Home / Self Care | Payer: Self-pay | Source: Home / Self Care | Attending: Family Medicine

## 2023-07-28 ENCOUNTER — Encounter (HOSPITAL_COMMUNITY): Payer: Self-pay | Admitting: Internal Medicine

## 2023-07-28 DIAGNOSIS — K921 Melena: Secondary | ICD-10-CM | POA: Insufficient documentation

## 2023-07-28 DIAGNOSIS — K449 Diaphragmatic hernia without obstruction or gangrene: Secondary | ICD-10-CM

## 2023-07-28 DIAGNOSIS — R7989 Other specified abnormal findings of blood chemistry: Secondary | ICD-10-CM | POA: Diagnosis not present

## 2023-07-28 DIAGNOSIS — E785 Hyperlipidemia, unspecified: Secondary | ICD-10-CM

## 2023-07-28 DIAGNOSIS — K2970 Gastritis, unspecified, without bleeding: Secondary | ICD-10-CM

## 2023-07-28 HISTORY — PX: ESOPHAGOGASTRODUODENOSCOPY (EGD) WITH PROPOFOL: SHX5813

## 2023-07-28 LAB — CBC
HCT: 27.9 % — ABNORMAL LOW (ref 36.0–46.0)
Hemoglobin: 9.1 g/dL — ABNORMAL LOW (ref 12.0–15.0)
MCH: 30.1 pg (ref 26.0–34.0)
MCHC: 32.6 g/dL (ref 30.0–36.0)
MCV: 92.4 fL (ref 80.0–100.0)
Platelets: 261 10*3/uL (ref 150–400)
RBC: 3.02 MIL/uL — ABNORMAL LOW (ref 3.87–5.11)
RDW: 17.1 % — ABNORMAL HIGH (ref 11.5–15.5)
WBC: 7.8 10*3/uL (ref 4.0–10.5)
nRBC: 0 % (ref 0.0–0.2)

## 2023-07-28 LAB — COMPREHENSIVE METABOLIC PANEL
ALT: 106 U/L — ABNORMAL HIGH (ref 0–44)
AST: 46 U/L — ABNORMAL HIGH (ref 15–41)
Albumin: 2.5 g/dL — ABNORMAL LOW (ref 3.5–5.0)
Alkaline Phosphatase: 124 U/L (ref 38–126)
Anion gap: 6 (ref 5–15)
BUN: <5 mg/dL — ABNORMAL LOW (ref 6–20)
CO2: 25 mmol/L (ref 22–32)
Calcium: 7.9 mg/dL — ABNORMAL LOW (ref 8.9–10.3)
Chloride: 103 mmol/L (ref 98–111)
Creatinine, Ser: 0.69 mg/dL (ref 0.44–1.00)
GFR, Estimated: >60 mL/min (ref >60)
Glucose, Bld: 94 mg/dL (ref 70–99)
Potassium: 3.7 mmol/L (ref 3.5–5.1)
Sodium: 134 mmol/L — ABNORMAL LOW (ref 135–145)
Total Bilirubin: 1.2 mg/dL (ref 0.3–1.2)
Total Protein: 4.5 g/dL — ABNORMAL LOW (ref 6.5–8.1)

## 2023-07-28 LAB — PHOSPHORUS: Phosphorus: 4.5 mg/dL (ref 2.5–4.6)

## 2023-07-28 LAB — MAGNESIUM: Magnesium: 1.7 mg/dL (ref 1.7–2.4)

## 2023-07-28 SURGERY — ESOPHAGOGASTRODUODENOSCOPY (EGD) WITH PROPOFOL
Anesthesia: General

## 2023-07-28 MED ORDER — LACTATED RINGERS IV SOLN: INTRAVENOUS | Status: DC | PRN

## 2023-07-28 MED ORDER — FENTANYL CITRATE (PF) 100 MCG/2ML IJ SOLN
INTRAMUSCULAR | Status: AC
  Filled 2023-07-28: qty 2

## 2023-07-28 MED ORDER — LIDOCAINE HCL (CARDIAC) PF 100 MG/5ML IV SOSY
PREFILLED_SYRINGE | INTRAVENOUS | Status: DC | PRN
  Administered 2023-07-28: 50 mg via INTRAVENOUS

## 2023-07-28 MED ORDER — POLYETHYLENE GLYCOL 3350 17 G PO PACK
17.0000 g | PACK | Freq: Two times a day (BID) | ORAL | Status: DC
  Administered 2023-07-28 – 2023-07-29 (×3): 17 g via ORAL
  Filled 2023-07-28 (×3): qty 1

## 2023-07-28 MED ORDER — PROPOFOL 500 MG/50ML IV EMUL
INTRAVENOUS | Status: DC | PRN
Start: 2023-07-28 — End: 2023-07-28
  Administered 2023-07-28: 100 ug/kg/min via INTRAVENOUS

## 2023-07-28 SURGICAL SUPPLY — 15 items

## 2023-07-28 NOTE — Progress Notes (Signed)
PROGRESS NOTE    Anna Serrano  RXV:[id number] DOB: [birth year] DOA: 07/21/2023 PCP: Macy Mis, MD   Brief Narrative:  This 47 years old female with PMH significant for recent panniculectomy 2 days ago, presented in the ED with abdominal discomfort associated with nausea and chills.  Patient reports she was doing well after surgery and was able to tolerate diet however the evening prior to admission she notes worsening abdominal discomfort chills nausea but no vomiting.  MRCP consistent with choledocholithiasis, ERCP with stone removal unremarkable.  Unfortunately postoperatively patient had notably worsening pain, downtrending hemoglobin concern for GI bleed.  General surgery initially consulted for possible cholecystectomy but recommended outpatient follow-up given recent panniculectomy and abdominoplasty.  Plan to follow-up with your surgeon at outside facility earlier this week has been delayed due to hospitalization.  Assessment & Plan:   Principal Problem:   Elevated LFTs Active Problems:   MDD (major depressive disorder), recurrent episode, moderate (HCC)   Blood pressure check   Dyslipidemia   Obstructive jaundice   Choledocholithiasis: -Patient presented with worsening abdominal pain fluctuating over the past 3 months -Given elevated LFTs CT abdomen showed common bile duct dilatation GI consulted for further evaluation, MRCP positive with ERCP performed and gallstone successfully removed without complication -Postoperatively patient noted to have worsening anemia, repeat ERCP planned -Initial discussion with surgery in regards to cholecystectomy has been postponed given recent outpatient surgery -will await for recovery from prior surgery before attempting lap chole.  Acute symptomatic blood loss anemia: Melena : -Hemoglobin dropped from 10.9 > 8.8> 6.3 -Status post 4 unit PRBC since admit -Repeat EGD/ERCP pending to rule out source of  bleeding  Hyperlipidemia: Continue Lipitor.   Major depression severe recurrent without psychosis. Continue antipsychotic and antidepressant medications including Cymbalta, Latuda, Abilify, nortriptyline.   Elevated liver enzymes: Secondary to above.     Hypokalemia / Hypomagnesemia: Replaced.  Continue to monitor.    DVT prophylaxis: SCDs Start: 07/21/23 1940 Code Status:   Code Status: Full Code Family Communication: At bedside  Status is: Inpatient  Dispo: The patient is from: Home              Anticipated d/c is to: Home               Anticipated d/c date is: 24 to 48 hours              Patient currently not medically stable for discharge  Consultants:  GI  Procedures:  ERCP  Antimicrobials:  None indicated  Subjective: No acute issues or events overnight, no further episodes of melena, hematochezia or hematemesis.  Objective: Vitals:   07/27/23 0505 07/27/23 0506 07/27/23 1926 07/28/23 0457  BP: (!) 131/93 130/89 123/78 117/72  Pulse: (!) 109 (!) 108 91 92  Resp: 16 18 16 16   Temp: 97.9 F (36.6 C) 97.9 F (36.6 C) 98.2 F (36.8 C) (!) 97.3 F (36.3 C)  TempSrc: Oral  Oral Oral  SpO2: 100% 92% 100% 100%  Weight:      Height:        Intake/Output Summary (Last 24 hours) at 07/28/2023 0752 Last data filed at 07/28/2023 0433 Gross per 24 hour  Intake 2553.78 ml  Output 40 ml  Net 2513.78 ml   Filed Weights   07/21/23 0655  Weight: 74.8 kg    Examination:  General exam: Appears calm and comfortable  Respiratory system: Clear to auscultation. Respiratory effort normal. Cardiovascular system: S1 & S2 heard, RRR. No JVD,  murmurs, rubs, gallops or clicks. No pedal edema. Gastrointestinal system: Abdomen is nondistended, soft and nontender. No organomegaly or masses felt. Normal bowel sounds heard. Central nervous system: Alert and oriented. No focal neurological deficits. Extremities: Symmetric 5 x 5 power. Skin: No rashes, lesions or  ulcers Psychiatry: Judgement and insight appear normal. Mood & affect appropriate.     Data Reviewed: I have personally reviewed following labs and imaging studies  CBC: Recent Labs  Lab 07/24/23 1210 07/25/23 0626 07/25/23 1316 07/26/23 0714 07/27/23 1404 07/28/23 0532  WBC 18.6* 13.3*  --  12.1* 8.6 7.8  NEUTROABS 14.1* 8.7*  --   --   --   --   HGB 6.3* 7.2* 7.5* 6.1* 9.1* 9.1*  HCT 19.2* 21.5* 22.4* 18.4* 27.5* 27.9*  MCV 96.0 89.2  --  92.0 90.5 92.4  PLT 358 252  --  229 235 261   Basic Metabolic Panel: Recent Labs  Lab 07/23/23 0339 07/24/23 1210 07/25/23 0626 07/26/23 0522 07/27/23 0508 07/28/23 0532  NA 136 137 138 135 135 134*  K 3.5 3.4* 3.4* 3.6 3.1* 3.7  CL 107 108 109 107 105 103  CO2 24 23 23 24 24 25   GLUCOSE 97 111* 107* 107* 103* 94  BUN 6 30* 26* 19 8 <5*  CREATININE 0.68 0.70 0.73 0.51 0.64 0.69  CALCIUM 8.0* 8.2* 7.6* 7.3* 8.0* 7.9*  MG 1.7 1.5* 1.5* 1.8  --  1.7  PHOS 3.9 2.6 3.4 3.5  --  4.5   GFR: Estimated Creatinine Clearance: 86.1 mL/min (by C-G formula based on SCr of 0.69 mg/dL). Liver Function Tests: Recent Labs  Lab 07/23/23 0339 07/24/23 1210 07/25/23 0626 07/26/23 0522 07/28/23 0532  AST 207* 105* 69* 51* 46*  ALT 257* 205* 147* 114* 106*  ALKPHOS 382* 241* 156* 116 124  BILITOT 3.5* 1.5* 1.3* 1.2 1.2  PROT 5.0* 4.6* 4.0* 3.7* 4.5*  ALBUMIN 2.4* 2.4* 2.1* 1.9* 2.5*   No results for input(s): "LIPASE", "AMYLASE" in the last 168 hours. No results for input(s): "AMMONIA" in the last 168 hours. Coagulation Profile: Recent Labs  Lab 07/22/23 0334  INR 1.0   Cardiac Enzymes: No results for input(s): "CKTOTAL", "CKMB", "CKMBINDEX", "TROPONINI" in the last 168 hours. BNP (last 3 results) No results for input(s): "PROBNP" in the last 8760 hours. HbA1C: No results for input(s): "HGBA1C" in the last 72 hours. CBG: No results for input(s): "GLUCAP" in the last 168 hours. Lipid Profile: No results for input(s): "CHOL",  "HDL", "LDLCALC", "TRIG", "CHOLHDL", "LDLDIRECT" in the last 72 hours. Thyroid Function Tests: No results for input(s): "TSH", "T4TOTAL", "FREET4", "T3FREE", "THYROIDAB" in the last 72 hours. Anemia Panel: No results for input(s): "VITAMINB12", "FOLATE", "FERRITIN", "TIBC", "IRON", "RETICCTPCT" in the last 72 hours. Sepsis Labs: No results for input(s): "PROCALCITON", "LATICACIDVEN" in the last 168 hours.  Recent Results (from the past 240 hour(s))  SARS Coronavirus 2 by RT PCR (hospital order, performed in [facility] hospital lab) *cepheid single result test* Anterior Nasal Swab     Status: None   Collection Time: 07/21/23  7:49 AM   Specimen: Anterior Nasal Swab  Result Value Ref Range Status   SARS Coronavirus 2 by RT PCR NEGATIVE NEGATIVE Final    Comment: (NOTE) SARS-CoV-2 target nucleic acids are NOT DETECTED.  The SARS-CoV-2 RNA is generally detectable in upper and lower respiratory specimens during the acute phase of infection. The lowest concentration of SARS-CoV-2 viral copies this assay can detect is 250 copies / mL.  A negative result does not preclude SARS-CoV-2 infection and should not be used as the sole basis for treatment or other patient management decisions.  A negative result may occur with improper specimen collection / handling, submission of specimen other than nasopharyngeal swab, presence of viral mutation(s) within the areas targeted by this assay, and inadequate number of viral copies (<250 copies / mL). A negative result must be combined with clinical observations, patient history, and epidemiological information.  Fact Sheet for Patients:   RoadLapTop.co.za  Fact Sheet for Healthcare Providers: http://kim-miller.com/  This test is not yet approved or  cleared by the [place] FDA and has been authorized for detection and/or diagnosis of SARS-CoV-2 by FDA under an Emergency Use Authorization (EUA).  This  EUA will remain in effect (meaning this test can be used) for the duration of the COVID-19 declaration under Section 564(b)(1) of the Act, 21 U.S.C. section 360bbb-3(b)(1), unless the authorization is terminated or revoked sooner.  Performed at [facility], [address], [place], [place] [address]     Radiology Studies: No results found.  Scheduled Meds:  ARIPiprazole  2 mg Oral Daily   atorvastatin  10 mg Oral QHS   DULoxetine  60 mg Oral Daily   escitalopram  10 mg Oral Daily   linaclotide  290 mcg Oral QAC breakfast   lurasidone  20 mg Oral Daily   nortriptyline  50 mg Oral QHS   pantoprazole (PROTONIX) IV  40 mg Intravenous Q12H   senna-docusate  2 tablet Oral BID   sodium chloride flush  3 mL Intravenous Q12H   valACYclovir  1,000 mg Oral Daily   Continuous Infusions:  sodium chloride 125 mL/hr at 07/28/23 0433     LOS: 7 days   Time spent:  Azucena Fallen, DO [facility]  If 7PM-7AM, please contact night-coverage [email or web address]  07/28/2023, 7:52 AM

## 2023-07-28 NOTE — Transfer of Care (Signed)
Immediate Anesthesia Transfer of Care Note  Patient: TAJEE BITTEL  Procedure(s) Performed: ESOPHAGOGASTRODUODENOSCOPY (EGD) WITH PROPOFOL  Patient Location: PACU  Anesthesia Type:MAC  Level of Consciousness: awake and alert   Airway & Oxygen Therapy: Patient Spontanous Breathing and Patient connected to nasal cannula oxygen  Post-op Assessment: Report given to RN and Post -op Vital signs reviewed and stable  Post vital signs: Reviewed and stable  Last Vitals:  Vitals Value Taken Time  BP 133/74 07/28/23 1239  Temp 36.6 C 07/28/23 1239  Pulse 94 07/28/23 1239  Resp 18 07/28/23 1239  SpO2 100 % 07/28/23 1239  Vitals shown include unfiled device data.  Last Pain:  Vitals:   07/28/23 1239  TempSrc: Temporal  PainSc: 0-No pain      Patients Stated Pain Goal: 0 (07/24/23 1140)  Complications: No notable events documented.

## 2023-07-28 NOTE — Anesthesia Postprocedure Evaluation (Signed)
Anesthesia Post Note  Patient: Anna Serrano  Procedure(s) Performed: ESOPHAGOGASTRODUODENOSCOPY (EGD) WITH PROPOFOL     Patient location during evaluation: PACU Anesthesia Type: MAC Level of consciousness: awake and alert Pain management: pain level controlled Vital Signs Assessment: post-procedure vital signs reviewed and stable Respiratory status: spontaneous breathing, nonlabored ventilation and respiratory function stable Cardiovascular status: blood pressure returned to baseline and stable Postop Assessment: no apparent nausea or vomiting Anesthetic complications: no   No notable events documented.  Last Vitals:  Vitals:   07/28/23 1239 07/28/23 1240  BP: 133/74 130/81  Pulse: 98 96  Resp: 20 15  Temp: 36.6 C   SpO2: 100% 100%    Last Pain:  Vitals:   07/28/23 1240  TempSrc:   PainSc: 0-No pain                 Lannie Fields

## 2023-07-28 NOTE — Anesthesia Preprocedure Evaluation (Addendum)
Anesthesia Evaluation  Patient identified by MRN, date of birth, ID band Patient awake    Reviewed: Allergy & Precautions, NPO status , Patient's Chart, lab work & pertinent test results  Airway Mallampati: I  TM Distance: >3 FB Neck ROM: Full    Dental  (+) Teeth Intact, Dental Advisory Given   Pulmonary neg pulmonary ROS   Pulmonary exam normal breath sounds clear to auscultation       Cardiovascular hypertension, Normal cardiovascular exam(-) dysrhythmias  Rhythm:Regular Rate:Normal     Neuro/Psych  Headaches PSYCHIATRIC DISORDERS Anxiety Depression Bipolar Disorder      GI/Hepatic Neg liver ROS,,,Melena and anemia after ERCP - suspicion for post sphincterectomy bleeding   Endo/Other  negative endocrine ROS    Renal/GU negative Renal ROS  negative genitourinary   Musculoskeletal  (+) Arthritis , Osteoarthritis,  Fibromyalgia -, narcotic dependentOxy 5mg    Abdominal   Peds  Hematology  (+) Blood dyscrasia, anemia Hb 9.1, plt 261   Anesthesia Other Findings   Reproductive/Obstetrics negative OB ROS                             Anesthesia Physical Anesthesia Plan  ASA: 3  Anesthesia Plan: MAC   Post-op Pain Management:    Induction:   PONV Risk Score and Plan: 3 and Ondansetron, Dexamethasone, Midazolam and Treatment may vary due to age or medical condition  Airway Management Planned: Natural Airway and Simple Face Mask  Additional Equipment: None  Intra-op Plan:   Post-operative Plan:   Informed Consent: I have reviewed the patients History and Physical, chart, labs and discussed the procedure including the risks, benefits and alternatives for the proposed anesthesia with the patient or authorized representative who has indicated his/her understanding and acceptance.       Plan Discussed with: CRNA  Anesthesia Plan Comments: (Last airway note from previous ERCP:   Ventilation: Mask ventilation without difficulty Laryngoscope Size: Mac and 3 Grade View: Grade I Tube type: Oral Tube size: 7.0 mm Number of attempts: 1 )        Anesthesia Quick Evaluation

## 2023-07-28 NOTE — Plan of Care (Signed)

## 2023-07-28 NOTE — Interval H&P Note (Signed)
History and Physical Interval Note:  07/28/2023 12:04 PM  Anna Serrano  has presented today for surgery, with the diagnosis of Melena.  The various methods of treatment have been discussed with the patient and family. After consideration of risks, benefits and other options for treatment, the patient has consented to  Procedure(s) with comments: ESOPHAGOGASTRODUODENOSCOPY (EGD) WITH PROPOFOL (N/A) - need ERCP scope to look at ampulla, no flouro as a surgical intervention.  The patient's history has been reviewed, patient examined, no change in status, stable for surgery.  I have reviewed the patient's chart and labs.  Questions were answered to the patient's satisfaction.     Shirley Friar

## 2023-07-28 NOTE — Op Note (Signed)
[facility] Patient Name: Anna Serrano Procedure Date: 07/28/2023 MRN: [id number] Attending MD: Shirley Friar , MD, [id number] Date of Birth: 1976/08/19 CSN: [id number] Age: 47 Admit Type: Inpatient Procedure:                Upper GI endoscopy Indications:              Melena Providers:                Shirley Friar, MD, Stephens Shire RN, RN,                            Cephus Richer, RN, Marja Kays, Technician Referring MD:             hospital team Medicines:                Propofol per Anesthesia, Monitored Anesthesia Care Complications:            No immediate complications. Estimated Blood Loss:     Estimated blood loss: none. Procedure:                Pre-Anesthesia Assessment:                           - Prior to the procedure, a History and Physical                            was performed, and patient medications and                            allergies were reviewed. The patient's tolerance of                            previous anesthesia was also reviewed. The risks                            and benefits of the procedure and the sedation                            options and risks were discussed with the patient.                            All questions were answered, and informed consent                            was obtained. Prior Anticoagulants: The patient has                            taken no anticoagulant or antiplatelet agents. ASA                            Grade Assessment: III - A patient with severe                            systemic disease. After reviewing the risks and  benefits, the patient was deemed in satisfactory                            condition to undergo the procedure.                           After obtaining informed consent, the endoscope was                            passed under direct vision. Throughout the                            procedure, the patient's blood pressure, pulse,  and                            oxygen saturations were monitored continuously. The                            [id number]-[id number] ([id number]) Olympus endoscope was introduced                            through the mouth, and advanced to the second part                            of duodenum. The upper GI endoscopy was                            accomplished without difficulty. The patient                            tolerated the procedure well. Scope In: Scope Out: Findings:      The examined esophagus was normal.      The Z-line was regular and was found 40 cm from the incisors.      Segmental mild inflammation characterized by congestion (edema) and       erythema was found in the gastric antrum.      A single medium mucosal papule (nodule) with no bleeding and no stigmata       of recent bleeding was found on the greater curvature of the stomach.      A medium-sized hiatal hernia was present.      The examined duodenum was normal.      Limited view of ampulla ok. Bile noted. No bleeding seen. Impression:               - Normal esophagus.                           - Z-line regular, 40 cm from the incisors.                           - Gastritis.                           - A single mucosal papule (nodule) found in the  stomach.                           - Medium-sized hiatal hernia.                           - Normal examined duodenum.                           - No specimens collected. Moderate Sedation:      N/A - MAC procedure Recommendation:           - Soft diet.                           - Observe patient's clinical course. Procedure Code(s):        --- Professional ---                           [phone], Esophagogastroduodenoscopy, flexible,                            transoral; diagnostic, including collection of                            specimen(s) by brushing or washing, when performed                            (separate procedure) Diagnosis Code(s):         --- Professional ---                           K92.1, Melena (includes Hematochezia)                           K29.70, Gastritis, unspecified, without bleeding                           K31.89, Other diseases of stomach and duodenum                           K44.9, Diaphragmatic hernia without obstruction or                            gangrene CPT copyright 2022 [employer]. All rights reserved. The codes documented in this report are preliminary and upon coder review may  be revised to meet current compliance requirements. Shirley Friar, MD 07/28/2023 12:40:34 PM This report has been signed electronically. Number of Addenda: 0

## 2023-07-29 DIAGNOSIS — R7989 Other specified abnormal findings of blood chemistry: Secondary | ICD-10-CM | POA: Diagnosis not present

## 2023-07-29 LAB — HEMOGLOBIN AND HEMATOCRIT, BLOOD
HCT: 27.3 % — ABNORMAL LOW (ref 36.0–46.0)
Hemoglobin: 8.9 g/dL — ABNORMAL LOW (ref 12.0–15.0)

## 2023-07-29 NOTE — Progress Notes (Signed)
Mobility Specialist - Progress Note   07/29/23 0906  Mobility  Activity Ambulated with assistance in hallway  Level of Assistance Independent  Assistive Device None  Distance Ambulated (ft) 850 ft  Range of Motion/Exercises Active  Activity Response Tolerated well  Mobility Referral Yes  $Mobility charge 1 Mobility  Mobility Specialist Start Time (ACUTE ONLY) 0840  Mobility Specialist Stop Time (ACUTE ONLY) 0850  Mobility Specialist Time Calculation (min) (ACUTE ONLY) 10 min   Pt received in bed and agreed to mobility. Had no issues throughout session. Returned to bed with all needs met.  Marilynne Halsted Mobility Specialist

## 2023-07-29 NOTE — Discharge Summary (Signed)
Physician Discharge Summary  Anna Serrano FAO:[id number] DOB: 1976/05/05 DOA: 07/21/2023  PCP: Macy Mis, MD  Admit date: 07/21/2023 Discharge date: 07/29/2023  Admitted From: Home Disposition: Home  Recommendations for Outpatient Follow-up:  Follow up with PCP in 1-2 weeks Follow-up with GI in 6 to 8 weeks as scheduled, follow-up with general surgery for their schedule  Home Health: None Equipment/Devices: None  Discharge Condition: Stable CODE STATUS: Full Diet recommendation: Soft diet  Brief/Interim Summary: This 47 years old female with PMH significant for recent panniculectomy 2 days ago, presented in the ED with abdominal discomfort associated with nausea and chills.  Patient reports she was doing well after surgery and was able to tolerate diet however the evening prior to admission she notes worsening abdominal discomfort chills nausea but no vomiting.   MRCP consistent with choledocholithiasis, ERCP with stone removal unremarkable.  Unfortunately postoperatively patient had notably worsening pain, downtrending hemoglobin concern for GI bleed.  General surgery initially consulted for possible cholecystectomy but recommended outpatient follow-up given recent panniculectomy and abdominoplasty.  Plan to follow-up with your surgeon at outside facility earlier this week has been delayed due to hospitalization.  Patient hemoglobin remained stable, no further signs or symptoms of bleeding, labs otherwise downtrending appropriately.  Patient otherwise stable and agreeable for discharge home  Discharge Diagnoses:  Principal Problem:   Elevated LFTs Active Problems:   MDD (major depressive disorder), recurrent episode, moderate (HCC)   Blood pressure check   Dyslipidemia   Obstructive jaundice   Melena    Discharge Instructions   Allergies as of 07/29/2023       Reactions   Gabapentin Other (See Comments)   Side effects, joint pain   Topiramate Other (See Comments)    paresthesias   Zonisamide Other (See Comments)   Insomnia/increased HA   Tizanidine Other (See Comments)   Confusion, psychosis    Latex Rash   Rimegepant Nausea And Vomiting, Other (See Comments)   Abdominal pain        Medication List     STOP taking these medications    ALPRAZolam 1 MG tablet Commonly known as: XANAX       TAKE these medications    ARIPiprazole 2 MG tablet Commonly known as: ABILIFY Take 2 mg by mouth daily.   atorvastatin 10 MG tablet Commonly known as: LIPITOR Take 10 mg by mouth at bedtime.   cyclobenzaprine 10 MG tablet Commonly known as: FLEXERIL Take 10 mg by mouth 3 (three) times daily as needed for muscle spasms.   DULoxetine 60 MG capsule Commonly known as: CYMBALTA Take 60 mg by mouth daily.   escitalopram 10 MG tablet Commonly known as: LEXAPRO Take 10 mg by mouth daily.   finasteride 5 MG tablet Commonly known as: PROSCAR Take 5 mg by mouth daily.   ketoconazole 2 % shampoo Commonly known as: NIZORAL Apply 1 Application topically once a week. Wednesdays   Linzess 290 MCG Caps capsule Generic drug: linaclotide Take 290 mcg by mouth daily before breakfast.   lovastatin 20 MG tablet Commonly known as: MEVACOR Take 20 mg by mouth at bedtime.   lurasidone 20 MG Tabs tablet Commonly known as: LATUDA Take 20 mg by mouth daily.   nortriptyline 50 MG capsule Commonly known as: PAMELOR Take 50 mg by mouth at bedtime.   oxyCODONE 5 MG immediate release tablet Commonly known as: Oxy IR/ROXICODONE Take 5 mg by mouth every 6 (six) hours as needed for moderate pain.   polyethylene glycol  17 g packet Commonly known as: MIRALAX / GLYCOLAX Take 17 g by mouth daily. What changed:  when to take this reasons to take this   promethazine 25 MG tablet Commonly known as: PHENERGAN Take 25 mg by mouth every 8 (eight) hours as needed for nausea or vomiting.   rizatriptan 10 MG tablet Commonly known as: MAXALT Take 10 mg by  mouth as needed for migraine.   valACYclovir 1000 MG tablet Commonly known as: VALTREX Take 1 tablet (1,000 mg total) by mouth daily.   zolpidem 10 MG tablet Commonly known as: AMBIEN Take 15 mg by mouth at bedtime as needed for sleep.        Follow-up Information     [facility] Follow up.   Specialty: General Surgery Why: our office is scheduling you for an outpatient appointment to discuss gallbladder surgery, please call to confirm appointment date/time. Contact information: [address] [place] [address] [phone]                Allergies  Allergen Reactions   Gabapentin Other (See Comments)    Side effects, joint pain   Topiramate Other (See Comments)    paresthesias   Zonisamide Other (See Comments)    Insomnia/increased HA   Tizanidine Other (See Comments)    Confusion, psychosis    Latex Rash   Rimegepant Nausea And Vomiting and Other (See Comments)    Abdominal pain    Consultations: GI, general surgery  Procedures/Studies: CT HEAD WO CONTRAST ( )  Result Date: 07/24/2023 CLINICAL DATA:  47 year old female status post fall while inpatient. Dizziness. EXAM: CT HEAD WITHOUT CONTRAST TECHNIQUE: Contiguous axial images were obtained from the base of the skull through the vertex without intravenous contrast. RADIATION DOSE REDUCTION: This exam was performed according to the departmental dose-optimization program which includes automated exposure control, adjustment of the mA and/or kV according to patient size and/or use of iterative reconstruction technique. COMPARISON:  Brain MRI 01/31/2011.  Head CT 08/20/2011 and earlier. 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 No superimposed midline shift, ventriculomegaly, acute mass effect, intracranial hemorrhage or evidence of cortically based acute infarction.  Gray-white matter differentiation is within normal limits throughout the brain. Vascular: No suspicious intracranial vascular hyperdensity. Faint chronic basal ganglia vascular calcifications more apparent on the right, stable. Skull: Intact, negative. Sinuses/Orbits: Visualized paranasal sinuses and mastoids are clear. Other: No orbit or scalp soft tissue injury identified. IMPRESSION: 1. No acute intracranial abnormality or acute traumatic injury identified. 2. 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 Electronically Signed   By: Odessa Fleming M.D.   On: 07/24/2023 05:10   DG ERCP  Result Date: 07/23/2023 CLINICAL DATA:  Choledocholithiasis [id number] Surgery, elective [id number] EXAM: ERCP COMPARISON:  MRCP, 07/13/2023.  CT AP, 07/13/2023. FLUOROSCOPY: Exposure Index (as provided by the fluoroscopic device): 31.4 mGy Kerma FINDINGS: Limited oblique planar images of the RIGHT upper quadrant obtained C-arm. Images demonstrating flexible endoscopy, biliary duct cannulation, sphincterotomy, retrograde cholangiogram and balloon sweep. Mild extrahepatic biliary ductal dilation without overt biliary filling defect is demonstrated. IMPRESSION: Fluoroscopic imaging for ERCP. For complete description of intra procedural findings, please see performing service dictation. Electronically Signed   By: Roanna Banning M.D.   On: 07/23/2023 15:11   MR ABDOMEN MRCP W WO CONTAST  Result Date: 07/21/2023 CLINICAL DATA:  Choledocholithiasis suspected prior CT EXAM: MRI ABDOMEN WITHOUT  AND WITH CONTRAST (INCLUDING MRCP) TECHNIQUE: Multiplanar multisequence MR imaging of the abdomen was performed both before and after the administration of intravenous contrast. Heavily T2-weighted images of the biliary and pancreatic ducts were obtained, and three-dimensional MRCP images were rendered by post processing. CONTRAST:  7.31mL GADAVIST GADOBUTROL 1 MMOL/ML IV SOLN COMPARISON:  Same-day CT abdomen pelvis FINDINGS:  Lower chest: Trace pleural effusions Hepatobiliary: No solid liver abnormality is seen. Numerous small gallstones in the gallbladder. Numerous small gallstones throughout the central common bile duct to the ampulla, individually measuring up to 0.5 cm (series 3, image 16, series 4, image 19). No significant gallbladder wall thickening. Dilatation of the common bile duct up to 0.8 cm with mild intrahepatic biliary ductal dilatation. Pancreas: Unremarkable. No pancreatic ductal dilatation or surrounding inflammatory changes. Spleen: Normal in size without significant abnormality. Adrenals/Urinary Tract: Adrenal glands are unremarkable. Kidneys are normal, without renal calculi, solid lesion, or hydronephrosis. Stomach/Bowel: Stomach is within normal limits. No evidence of bowel wall thickening, distention, or inflammatory changes. Vascular/Lymphatic: No significant vascular findings are present. No enlarged abdominal lymph nodes. Other: No abdominal wall hernia. Postoperative fat stranding the ventral abdomen in keeping with recent panniculectomy. No ascites. Musculoskeletal: No acute or significant osseous findings. IMPRESSION: 1. Choledocholithiasis with numerous small gallstones throughout the central common bile duct to the ampulla, individually measuring up to 0.5 cm. 2. Dilatation of the common bile duct up to 0.8 cm with mild intrahepatic biliary ductal dilatation. 3. Numerous small gallstones in the gallbladder without significant gallbladder wall thickening. 4. Postoperative fat stranding the ventral abdomen in keeping with recent panniculectomy. 5. Trace pleural effusions. These results will be called to the ordering clinician or representative by the Radiologist Assistant, and communication documented in the PACS or [employer]. Electronically Signed   By: Jearld Lesch M.D.   On: 07/21/2023 21:24   MR 3D Recon At Scanner  Result Date: 07/21/2023 CLINICAL DATA:  Choledocholithiasis suspected prior  CT EXAM: MRI ABDOMEN WITHOUT AND WITH CONTRAST (INCLUDING MRCP) TECHNIQUE: Multiplanar multisequence MR imaging of the abdomen was performed both before and after the administration of intravenous contrast. Heavily T2-weighted images of the biliary and pancreatic ducts were obtained, and three-dimensional MRCP images were rendered by post processing. CONTRAST:  7.105mL GADAVIST GADOBUTROL 1 MMOL/ML IV SOLN COMPARISON:  Same-day CT abdomen pelvis FINDINGS: Lower chest: Trace pleural effusions Hepatobiliary: No solid liver abnormality is seen. Numerous small gallstones in the gallbladder. Numerous small gallstones throughout the central common bile duct to the ampulla, individually measuring up to 0.5 cm (series 3, image 16, series 4, image 19). No significant gallbladder wall thickening. Dilatation of the common bile duct up to 0.8 cm with mild intrahepatic biliary ductal dilatation. Pancreas: Unremarkable. No pancreatic ductal dilatation or surrounding inflammatory changes. Spleen: Normal in size without significant abnormality. Adrenals/Urinary Tract: Adrenal glands are unremarkable. Kidneys are normal, without renal calculi, solid lesion, or hydronephrosis. Stomach/Bowel: Stomach is within normal limits. No evidence of bowel wall thickening, distention, or inflammatory changes. Vascular/Lymphatic: No significant vascular findings are present. No enlarged abdominal lymph nodes. Other: No abdominal wall hernia. Postoperative fat stranding the ventral abdomen in keeping with recent panniculectomy. No ascites. Musculoskeletal: No acute or significant osseous findings. IMPRESSION: 1. Choledocholithiasis with numerous small gallstones throughout the central common bile duct to the ampulla, individually measuring up to 0.5 cm. 2. Dilatation of the common bile duct up to 0.8 cm with mild intrahepatic biliary ductal dilatation. 3. Numerous small gallstones in the gallbladder without significant gallbladder  wall thickening.  4. Postoperative fat stranding the ventral abdomen in keeping with recent panniculectomy. 5. Trace pleural effusions. These results will be called to the ordering clinician or representative by the Radiologist Assistant, and communication documented in the PACS or [employer]. Electronically Signed   By: Jearld Lesch M.D.   On: 07/21/2023 21:24   CT ABDOMEN PELVIS W CONTRAST  Result Date: 07/21/2023 CLINICAL DATA:  Recent panniculectomy, abdominal pain, nausea EXAM: CT ABDOMEN AND PELVIS WITH CONTRAST TECHNIQUE: Multidetector CT imaging of the abdomen and pelvis was performed using the standard protocol following bolus administration of intravenous contrast. RADIATION DOSE REDUCTION: This exam was performed according to the departmental dose-optimization program which includes automated exposure control, adjustment of the mA and/or kV according to patient size and/or use of iterative reconstruction technique. CONTRAST:  OMNIPAQUE IOHEXOL 300 MG/ML  SOLN COMPARISON:  10/22/2005 FINDINGS: Lower chest: Lung bases are clear. Hepatobiliary: Liver is within normal limits. Gallbladder is unremarkable. Mild intrahepatic and extrahepatic ductal dilatation. Common duct measures 15 mm centrally (series 5/image 84). Suspected small distal CBD stone (series 2/image 32), poorly visualized. Pancreas: Within normal limits. Spleen: Within normal limits. Adrenals/Urinary Tract: Adrenal glands are within normal limits. Kidneys are within normal limits.  No hydronephrosis. Bladder is underdistended but unremarkable. 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 No evidence of bowel obstruction. Normal appendix (series 2/image 64). No colonic wall thickening or inflammatory changes. Vascular/Lymphatic: Aneurysm No suspicious abdominopelvic lymphadenopathy. Reproductive: Status post hysterectomy. No adnexal masses. Other: No abdominopelvic ascites. Musculoskeletal: Postsurgical changes with two surgical  drains along the anterior abdominal wall. No drainable fluid collection/seroma. Visualized osseous structures are within normal limits. IMPRESSION: Postsurgical changes related to recent panniculectomy. No drainable fluid collection/seroma. Mild intrahepatic and extrahepatic ductal dilatation. Suspected small distal CBD stone, poorly visualized. In the setting of abnormal LFTs, consider ERCP or MRCP for further evaluation. Electronically Signed   By: Charline Bills M.D.   On: 07/21/2023 09:35     Subjective: No acute issues or events overnight denies nausea vomiting diarrhea constipation headache fevers chills chest pain   Discharge Exam: Vitals:   07/28/23 1953 07/29/23 0535  BP: 110/71 (!) 106/58  Pulse: 90 87  Resp: 16 16  Temp: 99 F (37.2 C) 98.1 F (36.7 C)  SpO2: 100% 100%   Vitals:   07/28/23 1317 07/28/23 1647 07/28/23 1953 07/29/23 0535  BP: 129/75 113/74 110/71 (!) 106/58  Pulse: 79 92 90 87  Resp:   16 16  Temp: 97.9 F (36.6 C)  99 F (37.2 C) 98.1 F (36.7 C)  TempSrc:      SpO2: 100% 100% 100% 100%  Weight:      Height:        General: Pt is alert, awake, not in acute distress Cardiovascular: RRR, S1/S2 +, no rubs, no gallops Respiratory: CTA bilaterally, no wheezing, no rhonchi Abdominal: Soft, NT, ND, bowel sounds + Extremities: no edema, no cyanosis    The results of significant diagnostics from this hospitalization (including imaging, microbiology, ancillary and laboratory) are listed below for reference.     Microbiology: Recent Results (from the past 240 hour(s))  SARS Coronavirus 2 by RT PCR (hospital order, performed in [facility] hospital lab) *cepheid single result test* Anterior Nasal Swab     Status: None   Collection Time: 07/21/23  7:49 AM   Specimen: Anterior Nasal Swab  Result Value Ref Range Status   SARS Coronavirus 2 by RT PCR NEGATIVE NEGATIVE Final  Comment: (NOTE) SARS-CoV-2 target nucleic acids are NOT DETECTED.  The  SARS-CoV-2 RNA is generally detectable in upper and lower respiratory specimens during the acute phase of infection. The lowest concentration of SARS-CoV-2 viral copies this assay can detect is 250 copies / mL. A negative result does not preclude SARS-CoV-2 infection and should not be used as the sole basis for treatment or other patient management decisions.  A negative result may occur with improper specimen collection / handling, submission of specimen other than nasopharyngeal swab, presence of viral mutation(s) within the areas targeted by this assay, and inadequate number of viral copies (<250 copies / mL). A negative result must be combined with clinical observations, patient history, and epidemiological information.  Fact Sheet for Patients:   RoadLapTop.co.za  Fact Sheet for Healthcare Providers: http://kim-miller.com/  This test is not yet approved or  cleared by the [place] FDA and has been authorized for detection and/or diagnosis of SARS-CoV-2 by FDA under an Emergency Use Authorization (EUA).  This EUA will remain in effect (meaning this test can be used) for the duration of the COVID-19 declaration under Section 564(b)(1) of the Act, 21 U.S.C. section 360bbb-3(b)(1), unless the authorization is terminated or revoked sooner.  Performed at [facility], [address] [place], [place] [address]      Labs: BNP (last 3 results) No results for input(s): "BNP" in the last 8760 hours. Basic Metabolic Panel: Recent Labs  Lab 07/23/23 0339 07/24/23 1210 07/25/23 0626 07/26/23 0522 07/27/23 0508 07/28/23 0532  NA 136 137 138 135 135 134*  K 3.5 3.4* 3.4* 3.6 3.1* 3.7  CL 107 108 109 107 105 103  CO2 24 23 23 24 24 25   GLUCOSE 97 111* 107* 107* 103* 94  BUN 6 30* 26* 19 8 <5*  CREATININE 0.68 0.70 0.73 0.51 0.64 0.69  CALCIUM 8.0* 8.2* 7.6* 7.3* 8.0* 7.9*  MG 1.7 1.5* 1.5* 1.8  --  1.7  PHOS 3.9 2.6  3.4 3.5  --  4.5   Liver Function Tests: Recent Labs  Lab 07/23/23 0339 07/24/23 1210 07/25/23 0626 07/26/23 0522 07/28/23 0532  AST 207* 105* 69* 51* 46*  ALT 257* 205* 147* 114* 106*  ALKPHOS 382* 241* 156* 116 124  BILITOT 3.5* 1.5* 1.3* 1.2 1.2  PROT 5.0* 4.6* 4.0* 3.7* 4.5*  ALBUMIN 2.4* 2.4* 2.1* 1.9* 2.5*   No results for input(s): "LIPASE", "AMYLASE" in the last 168 hours. No results for input(s): "AMMONIA" in the last 168 hours. CBC: Recent Labs  Lab 07/24/23 1210 07/25/23 0626 07/25/23 1316 07/26/23 0714 07/27/23 1404 07/28/23 0532 07/29/23 0521  WBC 18.6* 13.3*  --  12.1* 8.6 7.8  --   NEUTROABS 14.1* 8.7*  --   --   --   --   --   HGB 6.3* 7.2* 7.5* 6.1* 9.1* 9.1* 8.9*  HCT 19.2* 21.5* 22.4* 18.4* 27.5* 27.9* 27.3*  MCV 96.0 89.2  --  92.0 90.5 92.4  --   PLT 358 252  --  229 235 261  --    Cardiac Enzymes: No results for input(s): "CKTOTAL", "CKMB", "CKMBINDEX", "TROPONINI" in the last 168 hours. BNP: Invalid input(s): "POCBNP" CBG: No results for input(s): "GLUCAP" in the last 168 hours. D-Dimer No results for input(s): "DDIMER" in the last 72 hours. Hgb A1c No results for input(s): "HGBA1C" in the last 72 hours. Lipid Profile No results for input(s): "CHOL", "HDL", "LDLCALC", "TRIG", "CHOLHDL", "LDLDIRECT" in the last 72 hours. Thyroid  function studies No results for input(s): "TSH", "T4TOTAL", "T3FREE", "THYROIDAB" in the last 72 hours.  Invalid input(s): "FREET3" Anemia work up No results for input(s): "VITAMINB12", "FOLATE", "FERRITIN", "TIBC", "IRON", "RETICCTPCT" in the last 72 hours. Urinalysis    Component Value Date/Time   COLORURINE YELLOW 07/21/2023 0700   APPEARANCEUR CLEAR 07/21/2023 0700   LABSPEC >=1.030 07/21/2023 0700   PHURINE 5.5 07/21/2023 0700   GLUCOSEU 100 (A) 07/21/2023 0700   HGBUR NEGATIVE 07/21/2023 0700   BILIRUBINUR LARGE (A) 07/21/2023 0700   KETONESUR NEGATIVE 07/21/2023 0700   PROTEINUR NEGATIVE 07/21/2023  0700   UROBILINOGEN 0.2 05/08/2013 2132   NITRITE NEGATIVE 07/21/2023 0700   LEUKOCYTESUR NEGATIVE 07/21/2023 0700   Sepsis Labs Recent Labs  Lab 07/25/23 0626 07/26/23 0714 07/27/23 1404 07/28/23 0532  WBC 13.3* 12.1* 8.6 7.8   Microbiology Recent Results (from the past 240 hour(s))  SARS Coronavirus 2 by RT PCR (hospital order, performed in [facility] hospital lab) *cepheid single result test* Anterior Nasal Swab     Status: None   Collection Time: 07/21/23  7:49 AM   Specimen: Anterior Nasal Swab  Result Value Ref Range Status   SARS Coronavirus 2 by RT PCR NEGATIVE NEGATIVE Final    Comment: (NOTE) SARS-CoV-2 target nucleic acids are NOT DETECTED.  The SARS-CoV-2 RNA is generally detectable in upper and lower respiratory specimens during the acute phase of infection. The lowest concentration of SARS-CoV-2 viral copies this assay can detect is 250 copies / mL. A negative result does not preclude SARS-CoV-2 infection and should not be used as the sole basis for treatment or other patient management decisions.  A negative result may occur with improper specimen collection / handling, submission of specimen other than nasopharyngeal swab, presence of viral mutation(s) within the areas targeted by this assay, and inadequate number of viral copies (<250 copies / mL). A negative result must be combined with clinical observations, patient history, and epidemiological information.  Fact Sheet for Patients:   RoadLapTop.co.za  Fact Sheet for Healthcare Providers: http://kim-miller.com/  This test is not yet approved or  cleared by the [place] FDA and has been authorized for detection and/or diagnosis of SARS-CoV-2 by FDA under an Emergency Use Authorization (EUA).  This EUA will remain in effect (meaning this test can be used) for the duration of the COVID-19 declaration under Section 564(b)(1) of the Act, 21 U.S.C. section  360bbb-3(b)(1), unless the authorization is terminated or revoked sooner.  Performed at [facility], [address] [place], [place] [address]      Time coordinating discharge: Over 30 minutes  SIGNED:   Azucena Fallen, DO [facility] 07/29/2023, 1:46 PM Pager   If 7PM-7AM, please contact night-coverage [email or web address]

## 2023-07-29 NOTE — Progress Notes (Signed)
[facility] Gastroenterology Progress Note  Anna Serrano 46 y.o. 1976/03/10   Subjective: Feels good. Tolerating POs. Denies abdominal pain. Shows picture of yellowish brown loose stool from this morning. Daughter in room.  Objective: Vital signs: Vitals:   07/28/23 1953 07/29/23 0535  BP: 110/71 (!) 106/58  Pulse: 90 87  Resp: 16 16  Temp: 99 F (37.2 C) 98.1 F (36.7 C)  SpO2: 100% 100%    Physical Exam: Gen: alert, no acute distress  HEENT: anicteric sclera CV: RRR Chest: CTA B Abd: midline incisional tenderness, soft, nondistended, +BS Ext: no edema  Lab Results: Recent Labs    07/27/23 0508 07/28/23 0532  NA 135 134*  K 3.1* 3.7  CL 105 103  CO2 24 25  GLUCOSE 103* 94  BUN 8 <5*  CREATININE 0.64 0.69  CALCIUM 8.0* 7.9*  MG  --  1.7  PHOS  --  4.5   Recent Labs    07/28/23 0532  AST 46*  ALT 106*  ALKPHOS 124  BILITOT 1.2  PROT 4.5*  ALBUMIN 2.5*   Recent Labs    07/27/23 1404 07/28/23 0532 07/29/23 0521  WBC 8.6 7.8  --   HGB 9.1* 9.1* 8.9*  HCT 27.5* 27.9* 27.3*  MCV 90.5 92.4  --   PLT 235 261  --       Assessment/Plan: Resolved GI bleed - Hgb 8.9. Ok to go home from GI standpoint. Will sign off. Call if questions.   Shirley Friar 07/29/2023, 10:24 AM  Questions please call [phone] ID: Anna Serrano, female   DOB: 07-16-1976, 47 y.o.   MRN: [id number]

## 2023-08-01 ENCOUNTER — Encounter (HOSPITAL_COMMUNITY): Payer: Self-pay | Admitting: Gastroenterology

## 2023-08-04 ENCOUNTER — Institutional Professional Consult (permissible substitution): Payer: Medicare Other | Admitting: Plastic Surgery

## 2023-08-12 ENCOUNTER — Other Ambulatory Visit: Payer: Self-pay | Admitting: Internal Medicine

## 2023-08-12 DIAGNOSIS — Z1231 Encounter for screening mammogram for malignant neoplasm of breast: Secondary | ICD-10-CM

## 2023-08-19 ENCOUNTER — Ambulatory Visit: Payer: Self-pay | Admitting: Surgery

## 2023-08-29 NOTE — Patient Instructions (Signed)
SURGICAL WAITING ROOM VISITATION  Patients having surgery or a procedure may have no more than 2 support people in the waiting area - these visitors may rotate.    Children under the age of 73 must have an adult with them who is not the patient.  Due to an increase in RSV and influenza rates and associated hospitalizations, children ages 34 and under may not visit patients in [facility] hospitals.  If the patient needs to stay at the hospital during part of their recovery, the visitor guidelines for inpatient rooms apply. Pre-op nurse will coordinate an appropriate time for 1 support person to accompany patient in pre-op.  This support person may not rotate.    Please refer to the [facility] website for the visitor guidelines for Inpatients (after your surgery is over and you are in a regular room).       Your procedure is scheduled on:  09/09/2023    Report to [facility] Main Entrance    Report to admitting at   100 pm   Call this number if you have problems the morning of surgery [phone]   Do not eat food :After Midnight.   After Midnight you may have the following liquids until __ 1200 noon  DAY OF SURGERY  Water Non-Citrus Juices (without pulp, NO RED-Apple, White grape, White cranberry) Black Coffee (NO MILK/CREAM OR CREAMERS, sugar ok)  Clear Tea (NO MILK/CREAM OR CREAMERS, sugar ok) regular and decaf                             Plain Jell-O (NO RED)                                           Fruit ices (not with fruit pulp, NO RED)                                     Popsicles (NO RED)                                                               Sports drinks like Gatorade (NO RED)                   The day of surgery:  Drink ONE (1) Pre-Surgery Clear Ensure or G2 at   1200 noon  ( have completed by )  the morning of surgery. Drink in one sitting. Do not sip.  This drink was given to you during your hospital  pre-op appointment visit. Nothing else to  drink after completing the  Pre-Surgery Clear Ensure or G2.          If you have questions, please contact your surgeon's office.       Oral Hygiene is also important to reduce your risk of infection.                                    Remember - BRUSH YOUR TEETH THE MORNING  OF SURGERY WITH YOUR REGULAR TOOTHPASTE  DENTURES WILL BE REMOVED PRIOR TO SURGERY PLEASE DO NOT APPLY "Poly grip" OR ADHESIVES!!!   Do NOT smoke after Midnight   Stop all vitamins and herbal supplements 7 days before surgery.   Take these medicines the morning of surgery with A SIP OF WATER:  cymbalta, latuda   DO NOT TAKE ANY ORAL DIABETIC MEDICATIONS DAY OF YOUR SURGERY  Bring CPAP mask and tubing day of surgery.                              You may not have any metal on your body including hair pins, jewelry, and body piercing             Do not wear make-up, lotions, powders, perfumes/cologne, or deodorant  Do not wear nail polish including gel and S&S, artificial/acrylic nails, or any other type of covering on natural nails including finger and toenails. If you have artificial nails, gel coating, etc. that needs to be removed by a nail salon please have this removed prior to surgery or surgery may need to be canceled/ delayed if the surgeon/ anesthesia feels like they are unable to be safely monitored.   Do not shave  48 hours prior to surgery.               Men may shave face and neck.   Do not bring valuables to the hospital. [facility] IS NOT             RESPONSIBLE   FOR VALUABLES.   Contacts, glasses, dentures or bridgework may not be worn into surgery.   Bring small overnight bag day of surgery.   DO NOT BRING YOUR HOME MEDICATIONS TO THE HOSPITAL. PHARMACY WILL DISPENSE MEDICATIONS LISTED ON YOUR MEDICATION LIST TO YOU DURING YOUR ADMISSION IN THE HOSPITAL!    Patients discharged on the day of surgery will not be allowed to drive home.  Someone NEEDS to stay with you for the first 24 hours  after anesthesia.   Special Instructions: Bring a copy of your healthcare power of attorney and living will documents the day of surgery if you haven't scanned them before.              Please read over the following fact sheets you were given: IF YOU HAVE QUESTIONS ABOUT YOUR PRE-OP INSTRUCTIONS PLEASE CALL [phone]   If you received a COVID test during your pre-op visit  it is requested that you wear a mask when out in public, stay away from anyone that may not be feeling well and notify your surgeon if you develop symptoms. If you test positive for Covid or have been in contact with anyone that has tested positive in the last 10 days please notify you surgeon.    [facility] - Preparing for Surgery Before surgery, you can play an important role.  Because skin is not sterile, your skin needs to be as free of germs as possible.  You can reduce the number of germs on your skin by washing with CHG (chlorahexidine gluconate) soap before surgery.  CHG is an antiseptic cleaner which kills germs and bonds with the skin to continue killing germs even after washing. Please DO NOT use if you have an allergy to CHG or antibacterial soaps.  If your skin becomes reddened/irritated stop using the CHG and inform your nurse when you arrive at Short Stay. Do not  shave (including legs and underarms) for at least 48 hours prior to the first CHG shower.  You may shave your face/neck. Please follow these instructions carefully:  1.  Shower with CHG Soap the night before surgery and the  morning of Surgery.  2.  If you choose to wash your hair, wash your hair first as usual with your  normal  shampoo.  3.  After you shampoo, rinse your hair and body thoroughly to remove the  shampoo.                           4.  Use CHG as you would any other liquid soap.  You can apply chg directly  to the skin and wash                       Gently with a scrungie or clean washcloth.  5.  Apply the CHG Soap to your body ONLY  FROM THE NECK DOWN.   Do not use on face/ open                           Wound or open sores. Avoid contact with eyes, ears mouth and genitals (private parts).                       Wash face,  Genitals (private parts) with your normal soap.             6.  Wash thoroughly, paying special attention to the area where your surgery  will be performed.  7.  Thoroughly rinse your body with warm water from the neck down.  8.  DO NOT shower/wash with your normal soap after using and rinsing off  the CHG Soap.                9.  Pat yourself dry with a clean towel.            10.  Wear clean pajamas.            11.  Place clean sheets on your bed the night of your first shower and do not  sleep with pets. Day of Surgery : Do not apply any lotions/deodorants the morning of surgery.  Please wear clean clothes to the hospital/surgery center.  FAILURE TO FOLLOW THESE INSTRUCTIONS MAY RESULT IN THE CANCELLATION OF YOUR SURGERY PATIENT SIGNATURE_________________________________  NURSE SIGNATURE__________________________________  ________________________________________________________________________

## 2023-08-29 NOTE — Progress Notes (Addendum)
Anesthesia Review:  PCP: Rocky Link - LOV 05/18/23  Clearance dated 09/02/2023 on chart    08/25/23- followup for shortness of breath with Ninetta Lights- Shortness of breath resolved.  Cardiologist : Leron Croak LOV 08/02/23  Pulm- Irving Burton Banks LOV 08/10/23  Initial consult on 08/30/2023 with Inf disease with DR Jennette Bill -" high risk for HIV  Chest x-ray : EKG : 07/24/23  Echo : Stress test: 2014  Cardiac Cath :  Activity level: can d  flight of stairs without difficulty  Sleep Study/ CPAP : none  Fasting Blood Sugar :      / Checks Blood Sugar -- times a day:   Blood Thinner/ Instructions /Last Dose: ASA / Instructions/ Last Dose :    08/10/23- CBC- hgb- 10.4 CMP done 08/10/23  Repeated CBC on 08/30/2023.

## 2023-08-30 ENCOUNTER — Encounter (HOSPITAL_COMMUNITY)
Admission: RE | Admit: 2023-08-30 | Discharge: 2023-08-30 | Disposition: A | Discharge status: Home / Self Care | Payer: Medicare Other | Source: Ambulatory Visit | Attending: Surgery | Admitting: Surgery

## 2023-08-30 ENCOUNTER — Other Ambulatory Visit: Payer: Self-pay

## 2023-08-30 ENCOUNTER — Encounter (HOSPITAL_COMMUNITY): Payer: Self-pay

## 2023-08-30 VITALS — BP 123/80 | HR 84 | Temp 98.1°F | Resp 16 | Ht 63.0 in | Wt 158.7 lb

## 2023-08-30 DIAGNOSIS — Z01812 Encounter for preprocedural laboratory examination: Secondary | ICD-10-CM | POA: Insufficient documentation

## 2023-08-30 DIAGNOSIS — Z01818 Encounter for other preprocedural examination: Secondary | ICD-10-CM

## 2023-08-30 HISTORY — DX: Gastro-esophageal reflux disease without esophagitis: K21.9

## 2023-08-30 LAB — CBC
HCT: 37 % (ref 36.0–46.0)
Hemoglobin: 12.1 g/dL (ref 12.0–15.0)
MCH: 30.2 pg (ref 26.0–34.0)
MCHC: 32.7 g/dL (ref 30.0–36.0)
MCV: 92.3 fL (ref 80.0–100.0)
Platelets: 276 10*3/uL (ref 150–400)
RBC: 4.01 MIL/uL (ref 3.87–5.11)
RDW: 14.1 % (ref 11.5–15.5)
WBC: 6.5 10*3/uL (ref 4.0–10.5)
nRBC: 0 % (ref 0.0–0.2)

## 2023-08-31 ENCOUNTER — Encounter (HOSPITAL_COMMUNITY): Payer: Self-pay | Admitting: Physician Assistant

## 2023-09-02 ENCOUNTER — Other Ambulatory Visit: Payer: Self-pay

## 2023-09-02 ENCOUNTER — Emergency Department (HOSPITAL_COMMUNITY)
Admission: EM | Admit: 2023-09-02 | Discharge: 2023-09-02 | Discharge status: Against Medical Advice | Payer: Medicare Other | Attending: Emergency Medicine | Admitting: Emergency Medicine

## 2023-09-02 ENCOUNTER — Encounter (HOSPITAL_COMMUNITY): Payer: Self-pay | Admitting: *Deleted

## 2023-09-02 DIAGNOSIS — R110 Nausea: Secondary | ICD-10-CM | POA: Insufficient documentation

## 2023-09-02 DIAGNOSIS — R079 Chest pain, unspecified: Secondary | ICD-10-CM | POA: Diagnosis not present

## 2023-09-02 DIAGNOSIS — R109 Unspecified abdominal pain: Secondary | ICD-10-CM | POA: Diagnosis present

## 2023-09-02 DIAGNOSIS — Z5321 Procedure and treatment not carried out due to patient leaving prior to being seen by health care provider: Secondary | ICD-10-CM | POA: Insufficient documentation

## 2023-09-02 LAB — COMPREHENSIVE METABOLIC PANEL
ALT: 39 U/L (ref 0–44)
AST: 30 U/L (ref 15–41)
Albumin: 4.1 g/dL (ref 3.5–5.0)
Alkaline Phosphatase: 81 U/L (ref 38–126)
Anion gap: 10 (ref 5–15)
BUN: 13 mg/dL (ref 6–20)
CO2: 21 mmol/L — ABNORMAL LOW (ref 22–32)
Calcium: 8.9 mg/dL (ref 8.9–10.3)
Chloride: 106 mmol/L (ref 98–111)
Creatinine, Ser: 0.72 mg/dL (ref 0.44–1.00)
GFR, Estimated: >60 mL/min (ref >60)
Glucose, Bld: 130 mg/dL — ABNORMAL HIGH (ref 70–99)
Potassium: 2.9 mmol/L — ABNORMAL LOW (ref 3.5–5.1)
Sodium: 137 mmol/L (ref 135–145)
Total Bilirubin: 0.6 mg/dL (ref 0.3–1.2)
Total Protein: 6.8 g/dL (ref 6.5–8.1)

## 2023-09-02 LAB — CBC
HCT: 38.1 % (ref 36.0–46.0)
Hemoglobin: 12.4 g/dL (ref 12.0–15.0)
MCH: 30 pg (ref 26.0–34.0)
MCHC: 32.5 g/dL (ref 30.0–36.0)
MCV: 92.3 fL (ref 80.0–100.0)
Platelets: 303 10*3/uL (ref 150–400)
RBC: 4.13 MIL/uL (ref 3.87–5.11)
RDW: 14.1 % (ref 11.5–15.5)
WBC: 11.5 10*3/uL — ABNORMAL HIGH (ref 4.0–10.5)
nRBC: 0 % (ref 0.0–0.2)

## 2023-09-02 LAB — LIPASE, BLOOD: Lipase: 22 U/L (ref 11–51)

## 2023-09-02 NOTE — ED Triage Notes (Signed)
Rt sided abd pain soft and tender, goes to back, woke out of sleep, Sept 30th gall stones removed, She is scheduled for Gall bladder removal on 09/09/23. Nausea noted. Unable to eat. Chest discomfort for about an hour. 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

## 2023-09-09 ENCOUNTER — Encounter (HOSPITAL_COMMUNITY): Admission: RE | Payer: Self-pay | Source: Ambulatory Visit

## 2023-09-09 ENCOUNTER — Ambulatory Visit (HOSPITAL_COMMUNITY): Admission: RE | Admit: 2023-09-09 | Payer: Medicare Other | Source: Ambulatory Visit | Admitting: Surgery

## 2023-09-09 SURGERY — LAPAROSCOPIC CHOLECYSTECTOMY
Anesthesia: General

## 2023-09-27 ENCOUNTER — Ambulatory Visit
Admission: RE | Admit: 2023-09-27 | Discharge: 2023-09-27 | Disposition: A | Discharge status: Home / Self Care | Payer: Medicare Other | Source: Ambulatory Visit | Attending: Internal Medicine | Admitting: Internal Medicine

## 2023-09-27 DIAGNOSIS — Z1231 Encounter for screening mammogram for malignant neoplasm of breast: Secondary | ICD-10-CM

## 2024-07-30 ENCOUNTER — Encounter (HOSPITAL_COMMUNITY): Payer: Self-pay

## 2024-07-30 ENCOUNTER — Emergency Department (HOSPITAL_COMMUNITY)
Admission: EM | Admit: 2024-07-30 | Discharge: 2024-07-30 | Disposition: A | Discharge status: Home / Self Care | Attending: Emergency Medicine | Admitting: Emergency Medicine

## 2024-07-30 ENCOUNTER — Other Ambulatory Visit: Payer: Self-pay

## 2024-07-30 DIAGNOSIS — Y990 Civilian activity done for income or pay: Secondary | ICD-10-CM | POA: Insufficient documentation

## 2024-07-30 DIAGNOSIS — W458XXA Other foreign body or object entering through skin, initial encounter: Secondary | ICD-10-CM | POA: Insufficient documentation

## 2024-07-30 DIAGNOSIS — Z91040 Latex allergy status: Secondary | ICD-10-CM | POA: Diagnosis not present

## 2024-07-30 DIAGNOSIS — S0501XA Injury of conjunctiva and corneal abrasion without foreign body, right eye, initial encounter: Secondary | ICD-10-CM | POA: Insufficient documentation

## 2024-07-30 MED ORDER — FLUORESCEIN SODIUM 1 MG OP STRP
ORAL_STRIP | OPHTHALMIC | Status: AC
  Administered 2024-07-30: 1 via OPHTHALMIC
  Filled 2024-07-30: qty 1

## 2024-07-30 MED ORDER — FLUORESCEIN SODIUM 1 MG OP STRP
1.0000 | ORAL_STRIP | Freq: Once | OPHTHALMIC | Status: AC
  Filled 2024-07-30: qty 1

## 2024-07-30 MED ORDER — POLYMYXIN B-TRIMETHOPRIM 10000-0.1 UNIT/ML-% OP SOLN
1.0000 [drp] | OPHTHALMIC | Status: DC
  Administered 2024-07-30: 1 [drp] via OPHTHALMIC
  Filled 2024-07-30: qty 10

## 2024-07-30 MED ORDER — ERYTHROMYCIN 5 MG/GM OP OINT
1.0000 | TOPICAL_OINTMENT | Freq: Once | OPHTHALMIC | Status: AC
  Administered 2024-07-30: 1 via OPHTHALMIC
  Filled 2024-07-30: qty 3.5

## 2024-07-30 MED ORDER — TETRACAINE HCL 0.5 % OP SOLN
1.0000 [drp] | Freq: Once | OPHTHALMIC | Status: AC
  Administered 2024-07-30: 1 [drp] via OPHTHALMIC
  Filled 2024-07-30: qty 4

## 2024-07-30 NOTE — ED Provider Notes (Signed)
 [place] EMERGENCY DEPARTMENT AT [facility] Provider Note   CSN: [id number] Arrival date & time: 07/30/24  1336     Patient presents with: No chief complaint on file.   Anna Serrano is a 48 y.o. female.   Patient is a 48 year old female with a history of fibroids, PTSD, migraines and GERD who is presenting today after getting nail glue in her right eye.  Patient wears contacts daily and reports her eye was feeling dry.  She grabbed a bottle to put some drops in her eye and after putting it into realize she had put nail glue in her eye while at work.  Her eyes started sticking together and she pried her eye open and got the contact out but since that time her eye has felt irritated.  The right eye was the only one affected.  She reports her vision seems just a little bit blurry and it is irritated when she blinks.  It has been tearing more and slightly red.  The history is provided by the patient.       Prior to Admission medications   Medication Sig Start Date End Date Taking? Authorizing Provider  bisacodyl (DULCOLAX) 5 MG EC tablet Take 15 mg by mouth every evening.    [provider]  cyclobenzaprine  (FLEXERIL ) 10 MG tablet Take 10 mg by mouth in the morning, at noon, and at bedtime. 06/03/20   [provider]  DULoxetine  (CYMBALTA ) 60 MG capsule Take 60 mg by mouth in the morning. 05/05/23   [provider]  finasteride (PROSCAR) 5 MG tablet Take 5 mg by mouth every evening. 05/31/23   [provider]  ketoconazole (NIZORAL) 2 % shampoo Apply 1 Application topically once a week. Wednesdays 06/19/22   [provider]  linaclotide  (LINZESS ) 290 MCG CAPS capsule Take 290 mcg by mouth every evening. 08/07/19   [provider]  lovastatin (MEVACOR) 20 MG tablet Take 20 mg by mouth at bedtime. 09/01/20   [provider]  lurasidone  (LATUDA ) 20 MG TABS tablet Take 20 mg by mouth in the morning. 07/07/23   [provider]  Multiple Vitamin (MULTIVITAMIN WITH MINERALS) TABS tablet Take 1 tablet by mouth in the morning. Alive! Women's Complete Multivitamin    [provider]  nortriptyline  (PAMELOR ) 50 MG capsule Take 50 mg by mouth at bedtime. 07/19/23   [provider]  promethazine  (PHENERGAN ) 25 MG tablet Take 25 mg by mouth every 8 (eight) hours as needed for nausea or vomiting. 12/02/22   [provider]  rizatriptan (MAXALT) 10 MG tablet Take 10 mg by mouth as needed for migraine. 02/13/20   [provider]  valACYclovir  (VALTREX ) 1000 MG tablet Take 1 tablet (1,000 mg total) by mouth daily. Patient taking differently: Take 1,000 mg by mouth every evening. 12/14/18   Sykes, Janet E, NP  zolpidem  (AMBIEN ) 10 MG tablet Take 15 mg by mouth at bedtime. 04/19/19   [provider]    Allergies: Gabapentin, Topiramate, Zonisamide, Tizanidine, Latex, and Rimegepant    Review of Systems  Updated Vital Signs BP 99/75   Pulse 83   Temp 98 F (36.7 C)   Resp 16   SpO2 100%   Physical Exam Vitals and nursing note reviewed.  Constitutional:      General: She is not in acute distress.    Appearance: She is well-developed.  HENT:     Head: Normocephalic and atraumatic.  Eyes:  Pupils: Pupils are equal, round, and reactive to light.     Comments: Contact us  still present in the left eye with colored iris.  Glue noted on the eyelashes of the right eye with an injected conjunctive up.  With tetracaine  and fluorescein  the eye was stained and appears to have a corneal abrasion in the center of the eye over the pupil.  Pupil is reactive to light and extraocular movements are intact.  Does not appear to have any glue on her eye at this time.  Cardiovascular:     Rate and Rhythm: Normal rate.  Pulmonary:     Effort: Pulmonary effort is normal.  Musculoskeletal:        General: No tenderness. Normal range of motion.     Comments: No edema  Skin:    General:  Skin is warm and dry.     Findings: No rash.  Neurological:     Mental Status: She is alert and oriented to person, place, and time.     Cranial Nerves: No cranial nerve deficit.  Psychiatric:        Behavior: Behavior normal.     (all labs ordered are listed, but only abnormal results are displayed) Labs Reviewed - No data to display  EKG: None  Radiology: No results found.   Procedures   Medications Ordered in the ED  fluorescein  1 MG ophthalmic strip (has no administration in time range)  tetracaine  (PONTOCAINE) 0.5 % ophthalmic solution 1-2 drop (has no administration in time range)  fluorescein  ophthalmic strip 1 strip (has no administration in time range)  erythromycin  ophthalmic ointment 1 Application (has no administration in time range)  trimethoprim -polymyxin b  (POLYTRIM ) ophthalmic solution 1 drop (has no administration in time range)                                    Medical Decision Making Risk Prescription drug management.   Patient presenting after getting glue in her eye.  Contact is already been removed.  Pupil is reactive and patient's vision is intact.  By tetracaine  and fluorescein  stain patient was found to have a corneal abrasion most likely from getting the contact out of her eye as patient has very long fingernails and suspect she scratched her eye.  Does not appear to have any glue left on the cornea.  Will apply erythromycin  ointment to help get glue out of the eyelashes and will place her on drops.  Recommended she not use any contacts for at least a week or until her symptoms resolved and follow-up with her eye doctor if is not getting better.     Final diagnoses:  Abrasion of right cornea, initial encounter    ED Discharge Orders     None          Doretha Folks, MD 07/30/24 1531

## 2024-07-30 NOTE — ED Triage Notes (Signed)
 Pt BIb PTAR from work at [employer] for accidentally getting fingernail glue in her right eye gluing her eye shut.  She used wash station to flush it out and get it open. She was able to get contact out but eye is red and painful and feels like pressure.   110/70 HR 70

## 2024-07-30 NOTE — Discharge Instructions (Addendum)
 Do not wear a contact for at least a week or until your eye is completely better.  Put the ointment on your eye another dose tonight and then at night for the next 5 days.  During the day you can use the other antibiotic drops.
